# Patient Record
Sex: Female | Born: 1935 | Race: White | Hispanic: No | Marital: Married
Health system: Southern US, Community
[De-identification: ages and names within clinical notes are randomized; demographics above are authoritative.]

## PROBLEM LIST (undated history)

## (undated) DIAGNOSIS — K222 Esophageal obstruction: Secondary | ICD-10-CM

## (undated) DIAGNOSIS — E039 Hypothyroidism, unspecified: Secondary | ICD-10-CM

## (undated) DIAGNOSIS — J310 Chronic rhinitis: Secondary | ICD-10-CM

## (undated) DIAGNOSIS — J45909 Unspecified asthma, uncomplicated: Secondary | ICD-10-CM

## (undated) DIAGNOSIS — D693 Immune thrombocytopenic purpura: Secondary | ICD-10-CM

## (undated) DIAGNOSIS — N6009 Solitary cyst of unspecified breast: Secondary | ICD-10-CM

## (undated) DIAGNOSIS — Z9289 Personal history of other medical treatment: Secondary | ICD-10-CM

## (undated) DIAGNOSIS — K660 Peritoneal adhesions (postprocedural) (postinfection): Secondary | ICD-10-CM

## (undated) DIAGNOSIS — E785 Hyperlipidemia, unspecified: Secondary | ICD-10-CM

## (undated) DIAGNOSIS — K08109 Complete loss of teeth, unspecified cause, unspecified class: Secondary | ICD-10-CM

## (undated) DIAGNOSIS — R739 Hyperglycemia, unspecified: Secondary | ICD-10-CM

## (undated) DIAGNOSIS — E049 Nontoxic goiter, unspecified: Secondary | ICD-10-CM

## (undated) DIAGNOSIS — K279 Peptic ulcer, site unspecified, unspecified as acute or chronic, without hemorrhage or perforation: Secondary | ICD-10-CM

## (undated) DIAGNOSIS — K922 Gastrointestinal hemorrhage, unspecified: Secondary | ICD-10-CM

## (undated) DIAGNOSIS — Z8719 Personal history of other diseases of the digestive system: Secondary | ICD-10-CM

## (undated) DIAGNOSIS — M1990 Unspecified osteoarthritis, unspecified site: Secondary | ICD-10-CM

## (undated) DIAGNOSIS — I10 Essential (primary) hypertension: Secondary | ICD-10-CM

## (undated) DIAGNOSIS — K635 Polyp of colon: Secondary | ICD-10-CM

## (undated) DIAGNOSIS — Z972 Presence of dental prosthetic device (complete) (partial): Secondary | ICD-10-CM

## (undated) HISTORY — DX: Solitary cyst of unspecified breast: N60.09

## (undated) HISTORY — DX: Hyperlipidemia, unspecified: E78.5

## (undated) HISTORY — DX: Nontoxic goiter, unspecified: E04.9

## (undated) HISTORY — PX: APPENDECTOMY: SHX54

## (undated) HISTORY — PX: ABDOMINAL HYSTERECTOMY: SHX81

## (undated) HISTORY — DX: Esophageal obstruction: K22.2

## (undated) HISTORY — DX: Peptic ulcer, site unspecified, unspecified as acute or chronic, without hemorrhage or perforation: K27.9

## (undated) HISTORY — DX: Hypothyroidism, unspecified: E03.9

## (undated) HISTORY — PX: THYROID SURGERY: SHX805

## (undated) HISTORY — PX: BACK SURGERY: SHX140

## (undated) HISTORY — PX: OTHER SURGICAL HISTORY: SHX169

## (undated) HISTORY — DX: Essential (primary) hypertension: I10

## (undated) HISTORY — PX: BREAST SURGERY: SHX581

## (undated) HISTORY — DX: Immune thrombocytopenic purpura: D69.3

## (undated) HISTORY — PX: CHOLECYSTECTOMY: SHX55

## (undated) HISTORY — DX: Polyp of colon: K63.5

## (undated) HISTORY — PX: HEMORRHOID SURGERY: SHX153

## (undated) HISTORY — DX: Peritoneal adhesions (postprocedural) (postinfection): K66.0

## (undated) HISTORY — PX: JOINT REPLACEMENT: SHX530

## (undated) HISTORY — PX: SPLENECTOMY, TOTAL: SHX788

## (undated) HISTORY — DX: Chronic rhinitis: J31.0

## (undated) HISTORY — DX: Hyperglycemia, unspecified: R73.9

## (undated) HISTORY — PX: HERNIA REPAIR: SHX51

---

## 1997-10-01 ENCOUNTER — Other Ambulatory Visit: Admission: RE | Admit: 1997-10-01 | Discharge: 1997-10-01 | Payer: Self-pay | Admitting: Family Medicine

## 1997-11-30 ENCOUNTER — Ambulatory Visit (HOSPITAL_COMMUNITY): Admission: RE | Admit: 1997-11-30 | Discharge: 1997-11-30 | Payer: Self-pay | Admitting: Gastroenterology

## 2000-10-03 ENCOUNTER — Other Ambulatory Visit: Admission: RE | Admit: 2000-10-03 | Discharge: 2000-10-03 | Payer: Self-pay | Admitting: Family Medicine

## 2001-01-08 ENCOUNTER — Encounter: Payer: Self-pay | Admitting: Family Medicine

## 2001-01-08 ENCOUNTER — Ambulatory Visit (HOSPITAL_COMMUNITY): Admission: RE | Admit: 2001-01-08 | Discharge: 2001-01-08 | Payer: Self-pay | Admitting: Family Medicine

## 2001-01-31 ENCOUNTER — Encounter (INDEPENDENT_AMBULATORY_CARE_PROVIDER_SITE_OTHER): Payer: Self-pay | Admitting: *Deleted

## 2001-01-31 ENCOUNTER — Encounter: Payer: Self-pay | Admitting: General Surgery

## 2001-01-31 ENCOUNTER — Ambulatory Visit (HOSPITAL_COMMUNITY): Admission: RE | Admit: 2001-01-31 | Discharge: 2001-01-31 | Payer: Self-pay | Admitting: General Surgery

## 2001-05-07 ENCOUNTER — Inpatient Hospital Stay (HOSPITAL_COMMUNITY): Admission: RE | Admit: 2001-05-07 | Discharge: 2001-05-08 | Payer: Self-pay | Admitting: Surgery

## 2001-05-07 ENCOUNTER — Encounter (INDEPENDENT_AMBULATORY_CARE_PROVIDER_SITE_OTHER): Payer: Self-pay | Admitting: *Deleted

## 2002-11-06 ENCOUNTER — Other Ambulatory Visit: Admission: RE | Admit: 2002-11-06 | Discharge: 2002-11-06 | Payer: Self-pay | Admitting: Family Medicine

## 2002-12-15 ENCOUNTER — Ambulatory Visit (HOSPITAL_COMMUNITY): Admission: RE | Admit: 2002-12-15 | Discharge: 2002-12-15 | Payer: Self-pay | Admitting: Gastroenterology

## 2002-12-15 ENCOUNTER — Encounter (INDEPENDENT_AMBULATORY_CARE_PROVIDER_SITE_OTHER): Payer: Self-pay

## 2003-07-06 ENCOUNTER — Encounter: Admission: RE | Admit: 2003-07-06 | Discharge: 2003-07-06 | Payer: Self-pay | Admitting: Orthopedic Surgery

## 2003-09-22 ENCOUNTER — Ambulatory Visit (HOSPITAL_COMMUNITY): Admission: RE | Admit: 2003-09-22 | Discharge: 2003-09-22 | Payer: Self-pay | Admitting: Specialist

## 2003-09-22 ENCOUNTER — Ambulatory Visit (HOSPITAL_BASED_OUTPATIENT_CLINIC_OR_DEPARTMENT_OTHER): Admission: RE | Admit: 2003-09-22 | Discharge: 2003-09-22 | Payer: Self-pay | Admitting: Specialist

## 2003-10-12 ENCOUNTER — Encounter: Admission: RE | Admit: 2003-10-12 | Discharge: 2003-12-20 | Payer: Self-pay | Admitting: Specialist

## 2003-12-21 ENCOUNTER — Ambulatory Visit (HOSPITAL_COMMUNITY): Admission: RE | Admit: 2003-12-21 | Discharge: 2003-12-21 | Payer: Self-pay | Admitting: Gastroenterology

## 2004-04-04 ENCOUNTER — Encounter: Admission: RE | Admit: 2004-04-04 | Discharge: 2004-04-27 | Payer: Self-pay | Admitting: Physician Assistant

## 2004-05-04 ENCOUNTER — Encounter: Admission: RE | Admit: 2004-05-04 | Discharge: 2004-05-04 | Payer: Self-pay | Admitting: Gastroenterology

## 2004-11-16 ENCOUNTER — Encounter: Admission: RE | Admit: 2004-11-16 | Discharge: 2004-11-16 | Payer: Self-pay | Admitting: Specialist

## 2005-03-12 HISTORY — PX: EYE SURGERY: SHX253

## 2005-07-18 ENCOUNTER — Encounter: Admission: RE | Admit: 2005-07-18 | Discharge: 2005-07-18 | Payer: Self-pay | Admitting: Specialist

## 2005-08-31 ENCOUNTER — Ambulatory Visit (HOSPITAL_COMMUNITY): Admission: RE | Admit: 2005-08-31 | Discharge: 2005-09-01 | Payer: Self-pay | Admitting: Specialist

## 2005-10-08 ENCOUNTER — Other Ambulatory Visit: Admission: RE | Admit: 2005-10-08 | Discharge: 2005-10-08 | Payer: Self-pay | Admitting: Family Medicine

## 2005-12-13 ENCOUNTER — Inpatient Hospital Stay (HOSPITAL_COMMUNITY): Admission: RE | Admit: 2005-12-13 | Discharge: 2005-12-20 | Payer: Self-pay | Admitting: Specialist

## 2005-12-13 ENCOUNTER — Ambulatory Visit: Payer: Self-pay | Admitting: Family Medicine

## 2006-01-15 ENCOUNTER — Encounter: Admission: RE | Admit: 2006-01-15 | Discharge: 2006-02-06 | Payer: Self-pay | Admitting: Specialist

## 2006-12-11 LAB — HM COLONOSCOPY

## 2006-12-27 ENCOUNTER — Inpatient Hospital Stay (HOSPITAL_COMMUNITY): Admission: EM | Admit: 2006-12-27 | Discharge: 2006-12-30 | Payer: Self-pay | Admitting: Emergency Medicine

## 2007-03-27 ENCOUNTER — Encounter: Admission: RE | Admit: 2007-03-27 | Discharge: 2007-03-27 | Payer: Self-pay | Admitting: Gastroenterology

## 2007-10-28 ENCOUNTER — Encounter: Admission: RE | Admit: 2007-10-28 | Discharge: 2007-10-28 | Payer: Self-pay | Admitting: Gastroenterology

## 2007-12-09 ENCOUNTER — Ambulatory Visit (HOSPITAL_COMMUNITY): Admission: RE | Admit: 2007-12-09 | Discharge: 2007-12-09 | Payer: Self-pay | Admitting: Gastroenterology

## 2008-03-12 HISTORY — PX: TOTAL KNEE ARTHROPLASTY: SHX125

## 2008-07-08 ENCOUNTER — Encounter: Admission: RE | Admit: 2008-07-08 | Discharge: 2008-07-08 | Payer: Self-pay | Admitting: Surgery

## 2009-02-16 ENCOUNTER — Ambulatory Visit (HOSPITAL_COMMUNITY): Admission: RE | Admit: 2009-02-16 | Discharge: 2009-02-16 | Payer: Self-pay | Admitting: Gastroenterology

## 2009-07-28 ENCOUNTER — Encounter: Admission: RE | Admit: 2009-07-28 | Discharge: 2009-07-28 | Payer: Self-pay | Admitting: Family Medicine

## 2009-08-01 LAB — HM MAMMOGRAPHY

## 2009-10-13 LAB — HM PAP SMEAR

## 2009-12-28 LAB — HM DEXA SCAN

## 2010-04-02 ENCOUNTER — Encounter: Payer: Self-pay | Admitting: Gastroenterology

## 2010-05-01 LAB — FECAL OCCULT BLOOD, GUAIAC: Fecal Occult Blood: NEGATIVE

## 2010-06-09 ENCOUNTER — Encounter: Payer: Self-pay | Admitting: Family Medicine

## 2010-06-09 DIAGNOSIS — D693 Immune thrombocytopenic purpura: Secondary | ICD-10-CM | POA: Insufficient documentation

## 2010-06-09 DIAGNOSIS — E049 Nontoxic goiter, unspecified: Secondary | ICD-10-CM | POA: Insufficient documentation

## 2010-06-09 DIAGNOSIS — K279 Peptic ulcer, site unspecified, unspecified as acute or chronic, without hemorrhage or perforation: Secondary | ICD-10-CM

## 2010-06-09 DIAGNOSIS — K635 Polyp of colon: Secondary | ICD-10-CM | POA: Insufficient documentation

## 2010-06-09 DIAGNOSIS — K222 Esophageal obstruction: Secondary | ICD-10-CM

## 2010-06-09 DIAGNOSIS — N6009 Solitary cyst of unspecified breast: Secondary | ICD-10-CM

## 2010-06-09 DIAGNOSIS — K660 Peritoneal adhesions (postprocedural) (postinfection): Secondary | ICD-10-CM | POA: Insufficient documentation

## 2010-06-09 DIAGNOSIS — E039 Hypothyroidism, unspecified: Secondary | ICD-10-CM

## 2010-06-09 DIAGNOSIS — E785 Hyperlipidemia, unspecified: Secondary | ICD-10-CM | POA: Insufficient documentation

## 2010-06-09 DIAGNOSIS — R739 Hyperglycemia, unspecified: Secondary | ICD-10-CM

## 2010-06-09 DIAGNOSIS — J310 Chronic rhinitis: Secondary | ICD-10-CM | POA: Insufficient documentation

## 2010-06-09 DIAGNOSIS — I10 Essential (primary) hypertension: Secondary | ICD-10-CM | POA: Insufficient documentation

## 2010-07-25 NOTE — Op Note (Signed)
[name]             ACCOUNT NO.:  [id number]   MEDICAL RECORD NO.:  [id number]          PATIENT TYPE:  AMB   LOCATION:  ENDO                         FACILITY:  [facility]   PHYSICIAN:  James L. Malon Kindle., M.D.DATE OF BIRTH:  23-Aug-1925   DATE OF PROCEDURE:  12/09/2007  DATE OF DISCHARGE:                               OPERATIVE REPORT   PROCEDURE:  Esophagogastroduodenoscopy with Savary dilatation under  fluoroscopic guidance.   MEDICATIONS:  Cetacaine spray, fentanyl 75 mcg, Versed 7.5 mg IV.   INDICATIONS:  A very nice 77 year old patient who has had a previous  antireflux operation many years ago in the early 65s or the late 60s.  This was on an open Nissen.  She has had no problem until recently  started having some dysphagia.  Barium swallow showed a stricture right  above the clips for the Nissen with a poor peristalsis in the esophagus.  The patient is having trouble swallowing pills and solids and after a  long discussion in the office we decided to go ahead and evaluate this  area endoscopically and perform a dilatation to see if this would help.  The risks and benefits of procedure had been discussed with her in  detail, as well as the alternatives.   DESCRIPTION OF PROCEDURE:  The patient was placed in the endoscopy unit  using fluoroscopic guidance.  The Pentax upper scope was inserted and  advanced.  The distal esophagus was reached.  It was slightly narrowed  with possibly a stricture.  The scope passed through easily.  A complete  endoscopy was performed and was normal.  The duodenal bulb and stomach  were seen well and forward and retroflex were normal.  Using  fluoroscopic guidance the Savary wire was threaded through the scope and  the scope withdrawn over the wire.  The wire was seen to traverse the  diaphragm and lie along greater curve.  I then extended the patient's  neck in the usual manner and passed a 12.8, 14-15 mm dilators.  A small  amount of  heme with a 15-mm dilator.  The dilator and guidewire were  withdrawn as a unit.   ASSESSMENT:  Esophageal stricture dilated to 15 mm.  No immediate  problems noted.   PLAN:  We will have to keep the patient on clear liquids today.  Soft  foods tomorrow and then advance her diet.  We will see her back in the  office in 6 weeks.  We will keep all of her medications the same.           ______________________________  Llana Aliment Malon Kindle., M.D.     Waldron Session  D:  12/09/2007  T:  12/09/2007  Job:  [id number]   cc:   Ernestina Penna, M.D.  Fax: [phone]

## 2010-07-25 NOTE — H&P (Signed)
[name], [name]             ACCOUNT NO.:  [id number]   MEDICAL RECORD NO.:  [id number]          PATIENT TYPE:  INP   LOCATION:  1829                         FACILITY:  [facility]   PHYSICIAN:  Petra Kuba, M.D.    DATE OF BIRTH:  1925/11/29   DATE OF ADMISSION:  12/27/2006  DATE OF DISCHARGE:                              HISTORY & PHYSICAL   HISTORY OF PRESENT ILLNESS:  This is a 77 year old female who is  experiencing bright red blood per rectum over the last approximately 7  days.  She is status post colonoscopy yesterday that showed evidence of  bleeding from diverticular opening, also severe diverticulosis  throughout her colon.  The patient has some pain with movement in her  right flank.  She reports that she has been mildly constipated recently.  She has seen bright red blood only with brownish stools.  She has had 2  bowel movements with blood this morning.  The patient has had a recent  kidney infection and has been on Keflex 250 mg q.h.s. for several  days.  The patient has also had a cough.  She reports she has a history  of asthmatic bronchitis and has been experiencing this cough for 2 to 3  weeks.  The patient is currently in stable condition in the ER.  She  states that she is dizzy when standing.  Her hemoglobin on December 23, 2006, was 12.4 yesterday and in the [facility] office it was 8.6. She is  currently in an 81 mg aspirin but no other NSAIDs or anticoagulations.   PAST MEDICAL HISTORY:  1. Significant for osteoarthritis in her right knee.  She is status      post knee replacement.  2. Hypertension.  3. Borderline diabetes mellitus.  4. Hypothyroidism.  5. She is status post thyroidectomy in 2003.  6. Hyperlipidemia.  7. Gastroesophageal reflux disease.  8. Asthma/bronchitis.  9. Chronic hematuria.  10.Frequent UTIs.  11.She has a history of kidney stones.  12.Gastric ulcer in 2005.  13.History of colon polyps in 2004.  14.She has had multiple  cervical spinal surgeries and appendectomy,      hysterectomy, cholecystectomy.   PRIMARY CARE PHYSICIAN:  Rudi Heap, M.D. in [facility].   Margarette AsalAlinda Money, MD with [facility].   GASTROENTEROLOGIST:  Dr. Carman Ching with [facility].   CURRENT MEDICATIONS:  1. Synthroid.  2. Lipitor.  3. 81 mg aspirin per day.  4. Diovan HCT.  5. Calcium.  6. Multivitamin.  7. Tylenol.  8. Ambien.   ALLERGIES:  SHE HAS NO KNOWN DRUG ALLERGIES.   FAMILY HISTORY:  Positive for significant colon cancer in first-degree  relatives.   SOCIAL HISTORY:  She reports no drugs, alcohol or tobacco.   PHYSICAL EXAMINATION:  GENERAL:  She is alert and oriented in no  apparent distress.  She is very pleasant to speak with.  Her complexion  is slightly pale.  VITAL SIGNS:  Currently temperature 98.0, pulse 116, respirations 20,  blood pressure 126/63.  HEART:  Regular rate and rhythm.  She is slightly tachycardic.  There  are no murmurs, rubs or gallops appreciated on auscultation.  LUNGS:  Clear anteriorly.  ABDOMEN:  Soft, nontender, nondistended with hyperactive bowel sounds.   LABORATORY DATA:  Show a hemoglobin of 8.3, hematocrit 24.9, white count  has risen to 18.8, platelets 257,000.  PTT is on the low side 20, PT of  13.5.  Comprehensive metabolic panel shows her sodium at 139, potassium  3.5, chloride 106, bicarb 24, BUN 13, creatinine 0.98, glucose 137.  AST  30, ALT 15, alkaline phosphatase 67, total bilirubin 0.8.   ASSESSMENT:  Dr. Vida Rigger has seen and examined the patient, collected  a history and has chosen to admit her with a lower gastrointestinal  bleed.  His impression is that this a 77 year old female with multiple  medical problems who is currently experiencing a diverticular bleed with  an increasing white count.  We will admit to a telemetry bed.  Give a 2  unit transfusion of packed red blood cells gently rehydrate, start   antibiotics, closely monitor and provide deep vein thrombosis  prophylaxis.      Stephani Police, PA    ______________________________  Petra Kuba, M.D.    MLY/MEDQ  D:  12/27/2006  T:  12/28/2006  Job:  [id number]   cc:   Ernestina Penna, M.D.  Petra Kuba, M.D.  Carman Ching, MD

## 2010-07-28 NOTE — Op Note (Signed)
NAME:  [name], [name]               ACCOUNT NO.:  [id number]   MEDICAL RECORD NO.:  [id number]                   PATIENT TYPE:  AMB   LOCATION:  NESC                                 FACILITY:  [facility]   PHYSICIAN:  Kerrin Champagne, M.D.                DATE OF BIRTH:  1925-09-28   DATE OF PROCEDURE:  09/22/2003  DATE OF DISCHARGE:                                 OPERATIVE REPORT   PREOPERATIVE DIAGNOSES:  Right knee torn medial meniscus.  Degenerative  changes involving the medial joint line of the right knee.   POSTOPERATIVE DIAGNOSES:  Torn medial meniscus right knee involving  primarily the posterior rim and posterior attachment of the medial meniscus.  Degenerative changes of the medial femoral condyle grade 2 and 3 changes.  Loose body right knee involving osteochondral fragment.   PROCEDURE:  Right knee diagnostic and operative arthroscopy with partial  medial meniscectomy, chondroplastic shaving of the medial femoral condyle,  excision of osteochondral fragment.   HISTORY OF PRESENT ILLNESS:  The patient is a 77 year old female who was  seen in my office acutely after having sustained injury to her right knee  when having fallen on a stairway. She had severe pain and discomfort into  her knee, difficulty with standing and walking for a period of time treated  conservatively with knee immobilizer, Ace wrap for knee sprain.  As the  patient proceeded, she continued with discomfort and severe pain.  MRI scan  demonstrating bone contusion medially but also a torn medial meniscus  involving the posterior horn body.  The possibility of early osteonecrosis  of the medial femoral condyle. The patient is brought to the operating room  to undergo partial medial meniscectomy and examination of her knee as she  persists with pain and discomfort, difficulty with ambulation now nearly two  months following her initial injury.   FINDINGS:  The patient was found to have a tear that  was primarily  horizontal cleave involving the posterior rim of the medial meniscus in the  region of the posterior attachment as well, complex tear involving the area  of the posterior attachment primarily on the inferior surface. This was  debrided and trimmed and then smoothed using shavers as well as baskets.   DESCRIPTION OF PROCEDURE:  After adequate general anesthesia, the patient in  a supine position, knee holder for the right knee, standard prep with  Duraprep solution, tourniquet about the right upper thigh that was not used  during the case.  Following prep, draped in the usual manner.  The knee then  anesthetized by subcu infiltration using Marcaine 0.5% with 1:200,000  epinephrine over the anterior inferior medial, anterior inferior lateral  portal regions.  The knee inflated with 60 mL of irrigant solution and stab  incisions made in the knee and entry into the knee over the inferolateral  portal using the arthroscopic sleeve with a blunt trocar.  The arthroscopic  sleeve was used for inflow  as well.   The patient's suprapatellar pouch evaluated as was the medial recess of the  medial joint line. The medial joint line immediately identified and torn  meniscus within the posterior rim and posterior attachment of the medial  meniscus. The medial joint line was quite wide with valgus stressing of the  knee.  The meniscus anteriorly and medially appeared to be in good  condition. The ACL appeared to be normal and the PCL did demonstrate some  slight fraying along its posterior lateral aspect. The lateral joint line  demonstrated some minimal fraying of the fibers of the meniscus within the  joint line centrally but no definitive care.   Stab incision then made over the medial joint line following introduction of  a spinal needle at the meniscocapsular junction.  A stab incision with an 11  blade scalpel and then blunt trocar used to enter the joint and a joint  probe then  placed. The probe placed over the meniscus in the posterior  medial corner if the medial meniscus and this demonstrated a definite tear  here.  A shaver was introduced for shaving the tear, baskets were then used  to further debride the tear in the posterior horn and the posterior rim  medially and laterally near its attachments. After trimming this area to  remove the larger portions of the tear then the meniscus was carefully  smoothed and remained attached posteriorly with the posterior pieces that  were more superiorly inserted in the capsule and the posterior aspect of the  knee. The probe was then replaced in the knee joint and attempts made to try  and sublux the cartilage into the joint, these were unsuccessful  demonstrating the cartilage to be well attached. On entry into the lateral  joint line, the osteochondral fragment was noted to be present. This quickly  floated away and then further evaluation of the knee demonstrated the  osteochondral fragment within the medial joint line. This was able to be  shaved using the arthroscopic shaver to a much smaller size and then it  eventually again cleared away. With attempts at further examination of the  knee, we were unable to identify the fragment further.  It was felt that  this was so small that it did not represent any further impediment function  or cause for mechanical dysfunction. Several areas of the joint did  demonstrate that she had had previous osteochondral fragments attached to  the synovium which were left in the place and did not represent a mechanical  problem with her knee.  Irrigation was then performed of the knee, attempts  at removing any further osteochondral material after further irrigation and  then the inflow and out flow were removed following removal of as much  irrigant solution from the knee as possible.   Each of the incisions both lower incision were infiltrated with Marcaine 0.5%  with 1:200,000  epinephrine and closed with interrupted 3-0 Vicryl  sutures.  Steri-Strips applied and tincture of Benzoin. Adaptic 4 x 4's, ABD  pad affixed to the skin with Kerlix and then an Ace wrap. The patient was  then reactivated and extubated and returned to the recovery room in  satisfactory condition.   POSTOPERATIVE CARE:  The patient will use a walker, weightbearing as  tolerated and take Vicodin #40, 1-2q. 4-6h. p.r.n. pain, 1 refill. Baby  aspirin 1 tablet p.o. b.i.d., be seen back in the office in 2 weeks for  reevaluation.  Kerrin Champagne, M.D.    Myra Rude  D:  09/22/2003  T:  09/22/2003  Job:  [id number]

## 2010-07-28 NOTE — Discharge Summary (Signed)
[name], [name]             ACCOUNT NO.:  [id number]   MEDICAL RECORD NO.:  [id number]          PATIENT TYPE:  INP   LOCATION:  5028                         FACILITY:  [facility]   PHYSICIAN:  Kerrin Champagne, M.D.   DATE OF BIRTH:  [birth year]   DATE OF ADMISSION:  12/13/2005  DATE OF DISCHARGE:  12/20/2005                                 DISCHARGE SUMMARY   ADMISSION DIAGNOSES:  1. Right knee osteoarthritis, severe.  2. Hypertension.  3. Borderline diabetes, controlled by diet.  4. Hypothyroidism, status post thyroidectomy.  5. Hyperlipidemia.  6. Gastroesophageal reflux disease  7. History of asthma.  8. History of chronic hematuria.  9. History of frequent urinary tract infections.  10.History of kidney stones.  11.History of gastric ulcer.  12.Status post anterior cervical discectomy and fusion C5-6 and recent      cervical posterior foraminotomy.  13.Status post right knee arthroscopy.   DISCHARGE DIAGNOSES:  .  Right knee osteoarthritis, severe.  1. Hypertension.  2. Borderline diabetes, controlled by diet.  3. Hypothyroidism, status post thyroidectomy.  4. Hyperlipidemia.  5. Gastroesophageal reflux disease  6. History of asthma.  7. History of chronic hematuria.  8. History of frequent urinary tract infections.  9. History of kidney stones.  10.History of gastric ulcer.  11.Status post anterior cervical discectomy and fusion C5-6 and recent      cervical posterior foraminotomy.  12.Status post right knee arthroscopy.  13.Postoperative anemia.  14.Urinary tract infection and urinary retention, treated and resolving at      discharge.  15.Hypokalemia, resolved at discharge.  16.Supratherapeutic INR at 3.2 on discharge with planned continued      Coumadin for deep venous thrombosis prophylaxis.   PROCEDURE:  On December 13, 2005, the patient underwent right cemented DePew  total knee arthroplasty, performed by [name], assisted by Maud Deed,  PA-C, under  general anesthesia, supplemented with right femoral block.   CONSULTATIONS:  Family [facility]ropractor Service at [facility].   BRIEF HISTORY:  Patient is a 77 year old white female with osteoarthritic  changes of the right knee.  She is status post arthroscopic debridement of  the right knee over a year ago.  She has had persistent and progressive pain  and dysfunction.  She has been treated with intraarticular steroids, as well  as multiple halogen injection series without significant relief of her  symptoms.  It was felt she would require surgical intervention and was  admitted to undergo the above stated procedure.   BRIEF HOSPITAL COURSE:  Patient tolerated the procedure under general  anesthesia without complications.  She did have significant difficulty with  pain control and PCA pump was initially used, which was not adequate with  Percocet being added, which was still not adequate and then patient was  placed on OxyContin 10 mg q.12 hours with Percocet for break through pain.  This had to be increased to OxyContin 20 mg q.12 hours for the Percocet with  break through pain and she did finally receive relief of her discomfort.  Due to the difficulty with pain control, she was slow with initiation  of  physical therapy.  For the first several days, she had very little mobility.  Eventually, she was able to ambulate, weight bearing as tolerated on the  operative extremity and prior to discharge had ambulated 200 feet with  rolling walker and moderately independent the day before discharge.  CPM was  ranging to 70 and 80 degrees prior to discharge.  She did have full  extension of the knee as well.  Also, she was able to do a straight leg  raise and had good quad control and, therefore, her knee immobilizer was  discontinued at the day of discharge.  Patient was followed by the family  practice teaching service for her medical issues.  Fortunately, she remained  quite stable  throughout the hospital stay.  She did have treatment with  sliding scale insulin and CBGs were found to be within normal limits at the  later part of her hospital stay without medication.  She was placed on  Coumadin for DVT and PE prophylaxis.  The doses of Coumadin were adjusted by  the pharmacist according to daily ProTimes.  At the time of discharge, she  was supratherapeutic and dosages were being adjusted.  Patient had  hypokalemia, which resolved after treatment with supplementation orally.  Due to the history of gastric ulcers, she was placed on Protonix daily and  tolerated this well.  Her Hemovac drain was discontinued on the first  postoperative day and dressing changes done daily, thereafter, showed wound  to be healing quite well without erythema or drainage.  Due to her slow  start with physical therapy, initially it was thought she may require a  nursing home placement; however, she proved to be quite motivated and was  able to be discharged to her home with arrangements made for home health  physical therapy.  The patient had an episode of urinary retention on the  day before discharge.  A urinalysis was obtained and she was noted to have a  urinary tract infection.  She was given one dose of Flomax.  She was much  more stable with bladder symptoms at the day of discharge.  Pertinent  laboratory values, otherwise, patient's hemoglobin and hematocrit ranged  from 14.3 on admission to 9.8 on December 17, 2005.  She was placed on  Trinsicon.  Hemoglobin A1c, on December 14, 2005, was noted to be 6.2.  Urinalysis as stated above.  Patient had hyponatremia, on December 14, 2005,  at 3.3 which resolved with oral supplementation.  Portable x-ray of the  right knee, on December 13, 2005, showed normal appearance status post total  knee replacement.  Chest x-ray, on August 29, 2005, showed no acute findings.  No EKG on the chart at the time of dictation.  PLAN:  The patient is being  discharged to her home.  Arrangements have been  made for her to receive home health physical therapy, as well as a bath aide  and home health RN through [facility].  For physical therapy,  she will do range of motion and strengthening exercises to the right lower  extremity.  She will weight bear as tolerated for ambulation.  She is not  required to use the knee immobilizer for ambulation, although she has this  available to her if she feels she needs to utilize the knee immobilizer.  Wound care will involve merely allowing the present Steri-Strips to resolve  spontaneously.  She will be allowed to shower and get her wound wet.  She  will continue on Coumadin and ProTimes will be drawn with adjustments made  according to these values.  She will stay on her usual regimen of  medications at discharge and is given prescriptions for OxyContin 20 mg one  p.o. q.12 hours, Percocet 5/325 one to two every 4 to 6 hours as needed for  pain, Robaxin 500 mg one every 8 hours as needed for spasm, Trinsicon 1 p.o.  b.i.d., MiraLax daily, Keflex 500 mg one p.o. t.i.d. x10 days, Protonix 40  mg p.o. daily, Coumadin per pharmacy.  Patient is advised to continue on a  diabetic type diet.  She will use a walker for  ambulation.  She is advised to see [name] 2 weeks from the date of her  surgery.  All questions encouraged and answered at the time of discharge.   CONDITION ON DISCHARGE:  Stable.      Wende Neighbors, P.A.      Kerrin Champagne, M.D.  Electronically Signed    SMV/MEDQ  D:  12/20/2005  T:  12/20/2005  Job:  [id number]

## 2010-07-28 NOTE — Op Note (Signed)
   NAME:  [name], MODISETTE               ACCOUNT NO.:  [id number]   MEDICAL RECORD NO.:  [id number]                   PATIENT TYPE:  AMB   LOCATION:  ENDO                                 FACILITY[facility]  [facility]   PHYSICIAN:  James L. Malon Kindle., M.D.          DATE OF BIRTH:  [birth year]   DATE OF PROCEDURE:  12/15/2002  DATE OF DISCHARGE:                                 OPERATIVE REPORT   PROCEDURE:  Colonoscopy and coagulation of polyps.   ENDOSCOPIST:  Llana Aliment. Edwards, M.D.   MEDICATIONS:  Fentanyl 100 mcg, Versed 10 mg IV.   INDICATIONS:  Strong family history of colon cancer.   DESCRIPTION OF PROCEDURE:  The procedure had been explained to the patient  and consent obtained.  With the patient in the left lateral decubitus  position the scope was inserted.  The prep was quite good.  We were able to  advance to the cecum identified by identification of the ileocecal valve and  appendiceal orifice which was all normal.   The scope was withdrawn.  There were scattered diverticula throughout the  colon.  The ascending colon, transverse, and descending colon were free of  polyps.  There were diverticula in the descending colon.  Multiple large  amounts of diverticula with balls of stool.  In the midsigmoid colon a 3-mm,  sessile polyp was encountered.  This was cauterized with the hot biopsy  forceps.  No other polyps were seen in the sigmoid or rectum.   The scope was withdrawn.  The patient tolerated the procedure well.   ASSESSMENT:  1. Sigmoid colon polyp, cauterized.  2. Strong family history of colon cancer.  3. Pandiverticulosis.   PLAN:  1. We will check path and will recommend repeating in 3-5 years depending on     path results.  2. Routine postpolypectomy instructions.                                               James L. Malon Kindle., M.D.   Waldron Session  D:  12/15/2002  T:  12/15/2002  Job:  [id number]   cc:   Ernestina Penna, M.D.  [address] [place]  [place] [address]  Fax: [phone]

## 2010-07-28 NOTE — Discharge Summary (Signed)
[name], [name]             ACCOUNT NO.:  [id number]   MEDICAL RECORD NO.:  [id number]          PATIENT TYPE:  INP   LOCATION:  6707                         FACILITY:  [facility]   PHYSICIAN:  Petra Kuba, M.D.    DATE OF BIRTH:  May 26, 1925   DATE OF ADMISSION:  12/27/2006  DATE OF DISCHARGE:  12/30/2006                               DISCHARGE SUMMARY   ADMITTING DIAGNOSIS:  Diverticular bleed.   DISCHARGE DIAGNOSES:  1. Diverticular bleed.  The rest are consistent with her past medical      history.  2. Hypertension.  3. Hypothyroidism.  4. Borderline diabetes mellitus.  5. Hyperlipidemia.  6. Gastroesophageal reflux disease.  7. Asthmatic bronchitis.  8. Chronic hematuria.  9. History of gastric ulcer.  10.History of colon polyps.   There were no consults or procedures.   RADIOLOGICAL EXAMS:  1. Chest x-ray done on December 27, 2006 for a 3-week cough with a      history of bronchitis.  The x-ray showed no acute disease.  2. The patient had a CT of her abdomen done on December 30, 2006 for      right lower quadrant pain of 3 days' duration.  The results of CT      showed:  1.  Left renal cyst.  Six-month CT followup was      recommended;  2.  Multiple bilateral lower lobe pulmonary nodules.  Chest CT without  contrast was recommended for further evaluation no acute intraabdominal  findings.  In the pelvis, impression is:  1.  Enlarged mesenteric lymph  nodes with stranding, likely mesenteric adenitis or could be related to  gastroenteritis;  2.  Colonic diverticulosis. without diverticulitis.  There was no explanation for her right lower quadrant pain.   BRIEF HISTORY AND PHYSICAL:  [name] is a 77 year old female who  experienced bright red blood per rectum over the last 7-10 days prior to  her admission.  She had a colonoscopy done on December 26, 2006 by Dr.  Carman Ching which showed bleeding from a diverticular opening as well  as severe diverticulosis  throughout her colon.  She reports that she had  been mildly constipated recently, and she sees bright red blood only  with brownish stools.  She reported that she had been experiencing  increasing cough over the last 2-3 weeks and that she was dizzy on  standing.  Her hemoglobin on December 23, 2006 in the [facility] office was 12.4.  On October 16, it had dropped to 8.6,  and Dr. Randa Evens recommended that she be admitted.   PHYSICAL EXAMINATION:  GENERAL:  On admission she was alert, oriented,  and in no apparent distress, very pleasant to speak with.  Her  complexion was slightly pale.  VITAL SIGNS:  Temperature was 98 degrees, pulse 116, respirations 20,  blood pressure was 126/63.  HEART:  Slightly tachycardic, but there were no murmurs, rubs, or  gallops appreciated.  LUNGS:  Clear anteriorly.  ABDOMEN:  Soft, nontender, nondistended, with hyperactive bowel sounds.   LABORATORIES:  Showed a hemoglobin of 8.3, hematocrit 24.9,  white count  18.8, and platelets 257,000.  Her PTT was slightly low at 20, PT was  13.5.  Comprehensive metabolic panel showed a sodium 139, potassium 3.5,  BUN 13, creatinine 0.98.  Her LFTs were within normal range.   HOSPITAL COURSE:  The patient was admitted, gently rehydrated.  She had  a transfusion of 4 units of packed red blood cells, and was placed on  Cipro and Flagyl IV, as well as Protonix, and she was given SCDs for DVT  prophylaxis.  The next morning, the patient reported feeling much  better.  She did have a black stool, but she was no longer experiencing  any dizziness. Over the course of the next couple of days, the patient  continued to improve.  Her diet was advanced.  She was able to ambulate  well.  Her urine culture came back negative.  On the morning of December 30, 2006, the patient told me that she felt well and wanted to go home.  She was still experiencing some minimal right lower quadrant pain that  she had had since  her admission, so was decided that she should have a  CT scan to evaluate this before being discharged.  Her CBC on December 28, 2006 was hemoglobin 11.4, hematocrit 33.1.  White count had  decreased to 11.4, and platelets were 241.  Her BMET was within normal  limits.  The patient was discharged to home later that day.  She was  given a prescription for 12 days of oral Flagyl as well as oral Cipro.  Her abdominal CT was negative for a source of right lower quadrant pain.  She was discharged to home in good condition.   DISCHARGE MEDICATIONS:  1. Cipro 400 mg 1 tablet b.i.d. for 12 days.  2. Flagyl 500 mg 1 tablet t.i.d. for 12 days.  3. Synthroid 150 mcg daily.  4. Synthroid 150 mcg 1/2 tablet on Sunday only.  5. Lipitor 40 mg 1 tablet daily.  6. Diovan/HCT 160/25 mg 1 tablet daily.  7. Aspirin 81 mg 1 tablet daily.  8. Os-Cal 500 mg plus vitamin D 1 tablet daily.  9. Multivitamin 1 tablet daily.  10.Tylenol 500 mg 2 tablets as needed.  11.Ambien CR 10 mg 1 tablet daily.   FOLLOW-UP INSTRUCTIONS:  The patient was given an appointment to see Dr.  Carman Ching at [facility] on January 10, 2007 at 2:30.  She was also  instructed to please call our office with any bright red blood per  rectum or increase in her right lower abdominal pain.      Stephani Police, PA    ______________________________  Petra Kuba, M.D.    MLY/MEDQ  D:  01/01/2007  T:  01/02/2007  Job:  [id number]   cc:   Fayrene Fearing L. Malon Kindle., M.D.  Ernestina Penna, M.D.  Maretta Bees. [name], M.D.

## 2010-07-28 NOTE — Op Note (Signed)
[name], [name]             ACCOUNT NO.:  [id number]   MEDICAL RECORD NO.:  [id number]          PATIENT TYPE:  AMB   LOCATION:  ENDO                         FACILITY:  [facility]   PHYSICIAN:  James L. Malon Kindle., M.D.DATE OF BIRTH:  04/03/1925   DATE OF PROCEDURE:  12/21/2003  DATE OF DISCHARGE:                                 OPERATIVE REPORT   PROCEDURE:  Esophagogastroduodenoscopy and biopsy.   MEDICATIONS:  Xylocaine spray, fentanyl 75 mcg, Versed 7 mg IV.   INDICATIONS FOR PROCEDURE:  Right upper quadrant pain.   DESCRIPTION OF PROCEDURE:  The procedure had been explained to the patient  and consent obtained. With the patient in the left lateral decubitus  position, Olympus scope inserted and advanced. The stomach, duodenum and  pylorus identified and passed. The duodenum was __________ down to the  second portion. The scope was withdrawn back in the stomach in the immediate  prepyloric area with a 1 cm gastric ulcer that appeared to be healing.  The  patient has been on the Aciphex sample that I gave her for approximately a  week. There were several erosions around the antrum __________ biopsies  taken for rapid urase test for helicobacter.  There were no other  ulcerations seen. The fundus and cardia were seen well on the retroflexed  view and were normal. The distal and proximal esophagus were endoscopically  normal. The scope was withdrawn and the patient tolerated the procedure well  and was monitored throughout.   ASSESSMENT:  Gastric ulcer appeared to be healing in the antrum, 531.30.   PLAN:  Will continue the patient on Aciphex and check the results of the  test for helicobacter and see back in the office in six weeks.      JLE/MEDQ  D:  12/21/2003  T:  12/21/2003  Job:  [id number]   cc:   Ernestina Penna, M.D.  [address] [place]  [place] [address]  Fax: [phone]

## 2010-07-28 NOTE — Consult Note (Signed)
[name], [name]             ACCOUNT NO.:  [id number]   MEDICAL RECORD NO.:  [id number]          PATIENT TYPE:  INP   LOCATION:  5028                         FACILITY:  [facility]   PHYSICIAN:  Barth Kirks, M.D.  DATE OF BIRTH:  10-14-1925   DATE OF CONSULTATION:  12/14/2005  DATE OF DISCHARGE:                                   CONSULTATION   REQUESTING PHYSICIAN:  Dr. Otelia Woodward of orthopedics for medical management.   CHIEF COMPLAINT:  Total knee replacement medical management consult.   HISTORY OF PRESENT ILLNESS:  Patient is a 77 year old pleasant white female  we were asked to see for medical consult by Dr. Otelia Woodward.  The patient has a  past medical history significant for hypertension, hyperthyroidism status  post thyroidectomy, hyperlipidemia, and borderline diabetes mellitus.  Patient is postop day #1 from right total knee replacement.  Patient is  followed by [name] at [facility].  Patient currently only  complains of pain status post surgery.   REVIEW OF SYSTEMS:  Review of systems is negative for all pertinents,  including negative for weight loss or weight gain, negative for chest pain,  negative shortness of breath, negative for heart or lung problems, negative  for abdominal pain.  Positive history of hematuria for whom she sees Dr.  [name] and has a history of kidney stones.  She has no history of blood  clots, and no history of blood in her stool.   PAST MEDICAL HISTORY:  Past medical history is significant for:  1. Osteoarthritis of the right knee, now status post total knee      replacement.  2. History of gastric ulcer.  3. Hypertension.  4. Borderline diabetes.  5. Hyperthyroidism status post thyroidectomy.  6. Hyperlipidemia.  7. Kidney stones.  8. Hematuria.   PAST SURGICAL HISTORY:  1. Debridement of right knee, now postop day #1 from a total right knee      replacement.  2. Left C6 facetectomy.  3. Left carpal tunnel release.  4.  Total thyroidectomy.   ALLERGIES:  No known drug allergies.   MEDICATIONS AT HOME:  1. Diovan/HCTZ half a tablet of 320/25 p.o. q. daily.  2. Lipitor half a tablet of 80 mg p.o. q. daily.  3. Synthroid 150 mcg q. daily.  4. Os-Cal with vitamin D 500 p.o. q. daily.  5. Multi vitamin 1 q. daily.  6. Glucosamine chondroitin 1 q. daily.  7. Aspirin 81 mg p.o. q. daily.  8. Albuterol inhaler 2 puffs p.r.n.  9. Tums p.o. p.r.n.  10.Vicodin 1 tab p.o. p.r.n.  11.Tylenol extra strength 2 tabs p.o. p.r.n.   CURRENT MEDS WHILE IN THE HOSPITAL:  1. Lipitor 40 mg q. daily.  2. Calcium carbonate 1 tab q. daily.  3. Ancef 1 g IV q.8.  4. Colace 100 mg p.o. b.i.d.  5. Heparin 5000 units sub cu q.12 hours.  6. Hydrochlorothiazide 12.5 mg q. daily.  7. Avapro 300 mg p.o. q. daily.  8. Levothyroxine 150 mcg p.o. q. daily.  9. Morphine sulfate 25 mcg IV q.4.  10.Senna 1 tab p.o. b.i.d.  11.Warfarin per pharmacy.  12.Acetaminophen 325 p.o. q.4 hours p.r.n.  13.Albuterol 2 puffs p.r.n.  14.Benadryl as needed.  15.Robaxin 500 mg p.o. q.6 hours p.r.n.  16.Reglan 10 mg IV q.6 p.r.n.  17.Narcan as needed.  18.Zofran 1 mg IV q.6.  19.Percocet 2 tablets p.o. q.4 hours p.r.n.  20.Phenergan 12.5 mg IV q.6.   SOCIAL HISTORY:  Patient lives with her husband in [place].  She has 3  kids, 2 in [place], 1 in [place].  She ambulates well at home.  She drives.  She has no history of tobacco, alcohol, or drug usage.   FAMILY HISTORY:  She has a strong family history of colon cancer.  Mom had a  history of cancer.  Her dad had a history of lung cancer.  There is no  family history of coronary artery disease or diabetes.   PHYSICAL EXAMINATION:  Vital signs:  T-current was 99.6 degrees Fahrenheit.  T-max was 100.7.  Yesterday, her T-max was 101.4.  Pulse was 89.  Respirations of 16.  Blood pressure is 103/52.  Sats are 96% on 2 L.  CBGs  are 124-135.  In general, she is in no acute  distress, pleasant, talkative.  HEENT:  Dry mucous membranes.  Extraocular muscles are intact.  CARDIOVASCULAR:  Regular rate and rhythm, with 3/6 systolic flow murmur,  which was present at her baseline previously.  No rub or gallop.  LUNGS:  Left lower lung has rhonchus sounds consistent with atelectasis,  anteriorly clear.  No wheezes.  ABDOMEN:  Soft, nontender, nondistended.  Positive bowel sounds.  EXTREMITIES:  Right knee in brace status post surgery.  Left leg in TED  hose.  No edema.  NEURO:  Alert and oriented x3.  Cranial nerves 2-12 are afocal.   Knee films:  Normal-appearing status post knee replacement.   LABORATORY DATA:  Today she had a white count of 12.6, hemoglobin 10.8,  hematocrit of 31.8, platelets of 181, potassium of 3.3, sodium of 138,  chloride of 101, bicarb at 30, BUN of 11, creatinine of 1.2, glucose 150,  calcium 8.1.  PT was 14.6.  INR was 1.1.   ASSESSMENT/PLAN:  This is a 77 year old white female status post right total  knee replacement post op day #1 with hypertension, hyperlipidemia, and  borderline diabetes, and hypothyroidism.  1. Status post knee replacement; this is for orthopedics.  Patient is      having physical therapy as scheduled.  Coumadin per pharmacy.  Pain      control is morphine PCA p.r.n.  Stool softeners p.r.n.  Incentive      spirometry will include at bedside q.2 p.r.n.  2. Hypertension:  She is well controlled on her home meds, which is      Diovan/HCTZ.  Here she has been substituted with Avapro/HCTZ.  This was      substituted by pharmacy.  Currently she is well controlled on that at      this time.  We will continue to monitor.  3. Borderline diabetes:  Will check CBGs q.a.c.  Without an evening      coverage, will do sliding scale with a sensitive scale of insulin per      sheet.  4. Hypothyroidism:  Continue her Synthroid 150 mcg q. daily. 5. FEN:  She is hypokalemic at this time and we will replace it.  Her IV       fluids currently are going of D5 normal saline at 75 mL an hour.  She      was then written to keep vein open  (KVO) as tolerated.  We will check      a BMET and a CBC in the a.m.  6. For prophylaxis, she is currently on heparin and Coumadin.  This is per      orthopedics.  We will start Protonix 40 mg IV daily with her known      history of gastric ulcer disease and GI prophylaxis.   We will be happy to follow this patient along with you for medical  management.  Thank you for this consult.      Barth Kirks, M.D.     MB/MEDQ  D:  12/14/2005  T:  12/14/2005  Job:  [id number]   cc:   Christell Constant, MD

## 2010-07-28 NOTE — Op Note (Signed)
[name], SAZAMA             ACCOUNT NO.:  [id number]   MEDICAL RECORD NO.:  [id number]          PATIENT TYPE:  INP   LOCATION:  5028                         FACILITY:  [facility]   PHYSICIAN:  Kerrin Champagne, M.D.   DATE OF BIRTH:  25-Feb-1926   DATE OF PROCEDURE:  12/13/2005  DATE OF DISCHARGE:                                 OPERATIVE REPORT   PREOPERATIVE DIAGNOSIS:  Severe osteoarthritis, right knee.   POSTOPERATIVE DIAGNOSIS:  Severe osteoarthritis, right knee.   PROCEDURE:  Right cemented DePuy PFC total knee arthroplasty using #3  femoral and #3 tibial components and a 12.5-mm posterior-stabilized tibial  tray, a 32-mm patella.   SURGEON:  Kerrin Champagne, M.D.   ASSISTANT:  Wende Neighbors, P.A.-C.   ANESTHESIA:  General via orotracheal intubation, Dr. Jacklynn Bue, supplemented  with a right femoral block by Dr. Jacklynn Bue.   SPECIMENS:  None.   ESTIMATED BLOOD LOSS:  75 cc.   COMPLICATIONS:  None.   TOTAL TOURNIQUET TIME:  At 300 mmHg, 2 hours 20 minutes.   DISPOSITION:  The patient returned to the PACU in good condition.   HISTORY OF PRESENT ILLNESS:  A 77 year old female, who has been treated for  osteoarthritis changes of the right knee.  She underwent primary  arthroscopic surgery with debridement of her knee with persistent pain and  discomfort.  She was treated with steroid injections and repeated  Synvisc/Hyalgan injections without significant relief of pain.  She is  brought to the operating room to undergo right total knee arthroplasty for  severe tricompartmental osteoarthritis.   INTRAOPERATIVE FINDINGS:  The patient had primarily medial joint line  arthrosis changes, lateral joint line with only mild changes, patellofemoral  joint with mild changes.   DESCRIPTION OF PROCEDURE:  After adequate general anesthesia, the patient  had a right thigh post, a foot holder for flexion of the right knee, a  tourniquet about the right upper thigh.  She was  prepped with DuraPrep  solution, including the right toes up to the level of the thigh tourniquet,  draped in the usual manner.  Iodine Vi-Drape was used.  Standard  preoperative antibiotics with Ancef.  The Green lab guide was used for the  procedure, and this was placed in the room for correct orientation.  The  right lower extremity was then elevated, exsanguinated with an Esmarch  bandage, and the tourniquet inflated to 300 mmHg.  A standard anterior  incision over the right knee, approximately 12 to 15 cm in length, through  the skin and subcutaneous layers, directed in the midline over the patella,  superior to the patella by about 5 to 6 cm, and inferior on the medial  aspect to the anterior tibial tubercle, about 2 cm beyond the anterior  tibial tubercle, through the skin and subcutaneous layers down to the  external retinaculum of the knee.  This was then incised and developed  medial and lateral for later approximation for a watertight closure later.  Entry into the knee then performed at the insertion of the quad tendon over  its medial insertion, preserving a cuff  of tissue along the medial aspect of  the parapatella, and then long the medial aspect of the patellar tendon  distally down to bone, and through the fat pad over the anterior aspect of  the proximal tibia.  This was continued down distally to the periosteum, and  then extended superiorly into the medial third of the quadriceps tendon  until the patella could be easily everted.  The synovial layer was also  incised on the medial aspect of the patella.   The posterior aspect of the patellar tendon fat pad was then debrided over  about 50% of its width, along with the medial aspect of the anterior aspect  of the tibia debrided the fat pad here.  Both menisci debrided over their  anteriors, anterior 50%, medial and lateral.  Anterior cruciate ligament was  incised, divided and removed.  The knee brought into some slight  forward  positioning of the tibia, allowing for debridement of the posterior cruciate  ligament. Osteophytes, both medial and lateral aspect of the femoral  condyles, medial and lateral tibial plateau, were debrided, and  subperiosteal dissection carried over the anterior aspect of the tibia both  medial and lateral, exposing the joint surface and joint line medial and  lateral, as well as anterior.  Carefully elevating the pes anserinus tendons  medially, along with the medial collateral ligament in order to tension the  soft tissues medially, as this patient had a varus knee.  With this  completed, the knee was then brought into flexion with the patella everted.  The fat pad overlying the distal portion of the femur just above the  condyles in the supracondylar region was then carefully incised in the  midline, and then elevated off the anterior aspect of the femur.  This for  exposure of the distal portion of the femur just above the supracondylar  region.   Schanz screws were then placed inside the incision over the anteromedial  aspect of the distal femur above the joint surface such that a guide pin for  placement of the jigs and guides for the jigs would be able to be used  appropriately without impinging on these Schanz screws.  Two Schanz screws  were placed proximally for the array proximal, and then 2 Schanz screws were  placed over the distal anteromedial aspect of the tibia about 3 to 4 inches  distal to the distal-most portion of the anterior incision of the knee.  These 2 Schanz screws were placed without difficulty, engaging the posterior  cortex over all 4 Schanz screws.   The Y array placed over the distal femur and adjusted appropriately.  The T  array applied to the tibia and adjusted for the computer.  Once the  adjustments were made so that the knee could be brought into full flexion  and full extension with excellent access to the computer and to its receiving  consoles, the arrays were then carefully fixed into place using  pliers to tighten each of the fasteners and hold the arrays in excellent  position and alignment sturdy.  With this completed, then, the registration  was performed for the axis of the femoral head, performing a circumduction-  type motion of the femur and the thigh.  Once these were obtained and the  registration was performed of the patient's medial and lateral malleoli, as  well as the anterior surface of the tibia, the posterior surface of the  tibial and the medial and lateral surfaces of the  tibia.  The distal end of  the femur just above the intercondylar notch was also mapped.   The femur, both medial and lateral epicondyles, were mapped, and then the  anterior surface of the femur mapped.  The distal-most portions of the  femoral condyle and posterior aspects of the femoral condyle, both medial  and lateral, were mapped, successively obtaining registration of points over  the distal-most portions of the femur, medial and lateral femoral condyles,  as well as the most posterior aspects of these condyles.  Then, measurement  of the convexity of the femur was carried out using the flat guide provided.  This returned to 0 as its registration.   The white size line was measured for both the femur, and over the tibia as  well.  The insertion point of the anterior cruciate ligament at its midpoint  of its insertion was measured, and the right-angle white size line was  measured across the tibia for its degree of rotation.   Mapping was then performed, not only of the femur, but of the proximal  tibia, mapping the anterior surface of the proximal tibia.  The medial  tibial plateau, and then lateral tibial plateaus, the deepest recesses, and  within 6 mm of this deep recess.   Each of these areas were then verified and were within 1 mm, in fact, within  0.5 mm of the described mapping and the computer's model for the  distal  femur and proximal tibia.  This being the case, then, the knee was brought  into full flexion and the guide for the proximal tibial cut with the applied  guide inserted with its array.  It was then carefully guided into place in  the proximal tibial jig, approximating about a 10-mm cut for the proximal  tibia; it was aligned in its correct axis in flexion and extension, as well  as medial-to-lateral, varus-valgus, and then pinned into place within 0.5  degrees and within 1.0 mm of the expected implant placement.  This being the  case, then, the cut was made in the proximal tibia, performing the cut,  carefully protecting the soft tissue structures.  Measurement of the  flatness of the surface of the proximal tibia indicated that the cut was  only 8.7 mm; so the second cut was then made 2 mm below the previous cut and  this returned a 10.2-mm depth cut and cut much closer to the predicted cut  of 10 mm of the proximal tibia.  The surface was well flattened, and measurements off of the flat surface of the proximal tibia indicated that  the tibial cut was within 0.5 degree of expected varus/valgus and within a  millimeter or less of the expected cut surface.  The edges were debrided of  any bony debris.  Excess meniscus posteriorly was then resected both  medially and laterally.  Attention then turned to the distal femoral surface  and the knee was flexed.  The distal femoral cutting jig was then carefully  aligned with the array attached into the correct position and alignment, and  then pinned into place using crosspins appropriately.  The distal cut was  then performed using an oscillating saw, making the transverse cut for the  distal femur.  This was done without difficulty, and measurement off the  distal femur indicated that a small amount needed to be removed off the  lateral condyle, and this was done through the same cutting jig without any  significant change  in the position  or alignment, removing further bone and  then allowing for good approximation of the expected cut surface.   Once this was completed, then the patient had the distal guide for the femur  with its array attached carefully centered onto the end of the femur and  aligned for the corrected plane here.  Then, using the 2 entry points  provided then, this was pinned into place and then removed.  Using the 2 pin  holes provided for the corrected flexion and extension of the end of the  femur, as well as external rotation, the cutting jig for the chamfer cuts,  as well as coronal, anterior and posterior cuts was applied.  This was  impacted into place and held in place with 2 tongs medial and lateral.  With  this, then, the anterior coronal cut was performed, and the cuts  posteriorly, using retractors to protect the soft tissue structures  posteriorly and the correct posterior condylar cuts were made, resecting an  appropriate amount of bone, both anterior and posterior.  Care was taken to  insure that the anterior cut would not damage the anterior surface of the  femur or cause broaching of the cortex, and this was done using the claw and  indicated that the cut would be superficial to the femoral cortex, and it  was such with the cutting with the oscillating saw.  Chamfer cuts were then  performed posteriorly and anteriorly.  With this, then, the distal cutting  jig for the box for the femur was then applied and carefully aligned into  position and alignment and pinned into place.  Using a thin oscillating saw,  then, the cuts were made for the box between the condyles of the femur  distally, and this was done without difficulty, rasping the edges  appropriately to smooth these.  This cutting jig was then removed.  The  patient then had the tibia subluxed anteriorly, and the #3 proximal tibial  plate was then carefully aligned and rotated into correct position and  alignment to allow for  adequate coverage of the proximal tibia.  Once it was providing excellent coverage and there was no overhang medially, this was  then pinned into place.  The upright for the proximal tibia reamer was then  carefully applied to the plate and impacted into place, obtaining seating  here.  The reamer in reverse was then used to ream the proximal tibia for  the keel for the tibial prosthesis.  Once this was completed, then, the  upright for the reamer, along with the reamer were removed.  A central peg  was then placed, along with its filler cap for allowing for a trial  prosthesis.  The femur #3 trial was then placed over the end of the femur  and impacted into place without difficulty and provided an excellent fit.  The 10-mm trial insert was placed, and then the leg placed into full  extension.  Note that the patient did undergo an assessment of soft tissue  tensioning.  This was done prior to the femoral cuts using the tensioner  between the medial and lateral femoral condyle with the knee in extension.  The extension gap appeared to be symmetric at about 14 to 14.5 mm medial and  lateral, and then with flexion, the extension-flexion gap appeared to be  symmetric at about 8 mm, both over the medial and lateral femoral condyle.  With this, it was felt that the patient required  no further cuts of the  femur or tibia, and one would proceed with the cuts as decided.   Returning then to the application of the trial components, a 10-mm trial  appeared to keep the leg in very slight varus on full extension, and the  12.5-mm trial tibial tray appeared to correct this with soft tissue  tensioning. The knee could be corrected to neutral varus/valgus.  Then, with  a very slight amount of pressure over the lateral aspect of the knee,  indicating soft tissue likely was the cause of much of the continued slight  degree of varus, 1.5 to 2 degrees.  This being the case, then, components  were removed  after first performing a cut of the patella.  The patellar  width was measured at 24 mm/-8 mm, and 16 mm was to remain of the patella.  Using the patellar clamp provided with a 16-mm adjustment, this clamp was  then applied to the patella, capturing the patellar surface.  Soft tissue on  either side, both superior and inferior to the posterior aspect of the  patella, was carefully resected, and the oscillating saw was then used to  make the coronal cut of the patella, removing about 8 mm of the  retropatellar surface.  A 32-mm patellar component was chosen; this appeared  to be near the patient's anatomic size of the patella.  The guide for the  drill holes was then placed, 2 holes medial and 1 hole lateral at the  expected cut surface.  Drilling was performed.  These were irrigated.  Final  reduction showed that with a trial patella in place, the knee could be  brought into full extension and full flexion, without any evidence of  patellar instability or tendency for the patella to dislocate.  The tibia showed no evidence of liftoff and no tendency to anterior drawer  instability.  With this, then, all of the patient's trial components were  removed, including the patella, the femur, the proximal tibial plateau with  its keel, as well as the tibial tray.  Irrigation was carried out of the  knee joint using a pulsed irrigant solution.  Cement was mixed.   Once cement mixing was completed, the components were on the field: a #3  femoral component, a #3 tibial component, trial trays to be used, and a 32-  mm patella.  The cement was in its correct state.  The knee was carefully  dried and the tibia subluxed anteriorly with Mikhail retractors  appropriately, allowing for exposure of the surface here.  Rasping had been  performed over the lateral aspect of the proximal tibia to try to provide  for some improvement of the slight varus that was seen to be present.  After  rasping and irrigation,  then, this was dried.  Careful inspection  demonstrated no bony debris present.  The posterior osteophytes off the  posterior aspect of the femoral condyles were resected using 3/4-inch  osteotomes and large Cregos  to allow for exposure, and these were removed.  Cement was then applied to the proximal tibia, as well as into the keel  area.  The tibial component was inserted with the engagement of the wings  and the correct degree of internal and external rotation, impacted into  place, held in place and excess cement was removed.  Further impaction was  performed and excess cement removed and further impaction performed.  Note  that some cement was placed over the bone.  I  inspected the surface of the  implant for allowing for further filling of the component-bone interfill.  Once this was completed and all excess cement had been removed, the tibia  was then replaced underneath the femur.  The femur surface was then  carefully dried with the sponges provided.  Cement was then applied over the  anterior aspect of the distal femur, as well as the distal portion of the  femur into lug holes that had been drilled using the trial prosthesis in  place, and had been irrigated as well, also over the posterior chamfer  region.  Additional cement applied over the posterior runners of the  permanent prosthesis.  The femoral component was then applied to the end of  the femur and impacted into place.  Excess cement was then removed, both  medial and lateral, as well as over the posterior aspects of the box cut  area.  The femur was further impacted in place into excellent position and  alignment both medial and lateral with good bone-to-prosthesis seating.  With this completed, then, a trial tibial tray of 12.5 mm was inserted  without the center post.  The knee was brought into full extension.  With  the knee in full extension, then the patellar component was applied.  Note  that the peg holes were  each carefully cleansed and dried.  Cement was applied, and then the 32-mm patellar component applied, and using the ringed  clamp, clamped into place.  Excess cement was removed circumferentially  about the patellar component.  The knee was then carefully stressed into a  slight valgus to try to remove any residual varus present.  In full  extension, this seemed to correct the knee to less than 1 degree of varus.  With this completed, then, irrigation was performed and the cement was  completely hardened.  The clamp was removed from the patella.  Also note  that while the cement was hardening, the patient had release of her  tourniquet at 2 hours 20 minutes.  The cement was completely hardened.  Excess cement was carefully trimmed from around the patellar prosthesis  using a knife.  The joint was debrided of any remaining residual cement  within the soft tissue structures.  Small amounts of cement were removed  from the posterior aspect of the knee, over the posterior aspect of the  femoral condyles and the box cut area.  Careful inspection was made for the  geniculate vessels, both medial and lateral, and also the perforating  vessels posteriorly.  These area were cauterized, and there was no  significant active bleeding present following the release of the tourniquet.  Care was taken to insure that the tourniquet had completely released, and  there was no further inflation of the tourniquet present, neither venous  tourniquet or arterial.   There was practically no bleeding present in the depths of the incision.  With this, then, the knee was subluxed using the Mikhails provided,  carefully protecting the smooth, polished surfaces of the implant.  The 12.5  permanent prosthesis was then inserted with the tibial tray into the  proximal portion of the tibia.  The knee was then replaced and relocated and  brought into full extension.  In full extension, 1 to 1.5 degrees of varus  noted on  the monitor.  The knee could be brought into full extension and  flexion of over 130 degrees.  The patella remained seated without any  attempts at holding pressure or  holding the knee cap reduced.  The knee  could be brought into full extension without difficulty.  The knee, indeed,  could be brought into slight hyperextension.  There was no active bleeding  present.  A medium Hemovac drain was placed in the depths of the incision,  exiting proximal lateral.  The synovial layer was then reapproximated with a  running stitch of 0 Vicryl suture.  The patient had her quad tendon  reapproximated with interrupted, #1 Vicryl sutures, and the retinaculum of  the knee immediately access with #1 Vicryl sutures down to the level of the  anterior tibial tubercle, along with the retinaculum here.  The external  retinaculum of the knee was approximated with interrupted 0 and 2-0 Vicryl  sutures.  Deep subcu layers were approximated with interrupted 0 Vicryl  sutures, the more superficial layers with interrupted 2-0 Vicryl sutures, and the skin closed with a running subcuticular stitch of 4-0 Vicryl.  Tincture of benzoin and Steri-Strips were applied, and 4 x 4's and ABD pads  fixed to the skin with a Kerlix.  Then, a long-leg Ace wrap applied from the  right foot to the upper thigh.  A knee immobilizer was applied.  The patient  was then reactivated, extubated and returned to the recovery room in  satisfactory condition.  All instrument and sponge counts were correct.      Kerrin Champagne, M.D.  Electronically Signed     JEN/MEDQ  D:  12/13/2005  T:  12/13/2005  Job:  [id number]

## 2010-07-28 NOTE — Op Note (Signed)
NAMEBRESHAE, [name]             ACCOUNT NO.:  [id number]   MEDICAL RECORD NO.:  [id number]          PATIENT TYPE:  AMB   LOCATION:  SDS                          FACILITY:  [facility]   PHYSICIAN:  Kerrin Champagne, M.D.   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   POSTOPERATIVE DIAGNOSES:  Left-sided C5-6 foraminal entrapment involving  primarily the C6 nerve root.   PROCEDURE:  Redo left C6 foraminotomy with near complete total facetectomy  posteriorly C5-6 left side.   SURGEON:  Kerrin Champagne, M.D.   ASSISTANT:  Wende Neighbors, P.A.   ANESTHESIA:  GOT, Kaylyn Layer. Michelle Piper, M.D.   ESTIMATED BLOOD LOSS:  75 mL.   DRAINS:  None.   HISTORY OF PRESENT ILLNESS:  This patient is a 77 year old female whose  undergone previous posterior cervical foraminotomy on the left side at the  C5-6 level. This surgery done nearly a decade ago. 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 The fusion  has since gone on to heal with some collapse of the disk space. She had  problems with persistent left upper extremity pain, underwent a pronator  syndrome release as well as a left carpal tunnel release. She did well for a  period of nearly 5-7 years and now returns with increasing left upper  extremity pain in the C6 distribution. Left-sided C5 selective nerve root  block unsuccessful in relieving her pain. A C6 nerve root block did relieve  her pain. She underwent MRI studies which demonstrated some narrowing of the  left C6 neuroforamen. A CT scan limited  through the left C5-6 level  demonstrated foraminal entrapment that was persistent secondary to posterior  spondylosis changes, impingement upon the C6 neuroforamen by residual  posterior elements laterally. There is some mild uncovertebral changes but  not enough to suggest that anterior procedure was necessary.   FINDINGS:  The patient was found to have compression of the C6 nerve root  within the outer portions of the neuroforamen secondary to the superior  articular process of C6 pressing down upon the C6 nerve root. This likely  secondary to collapse of the disk space over her anterior diskectomy and  fusion site at C5-6.   DESCRIPTION OF PROCEDURE:  After adequate general anesthesia, the patient in  a prone position. The [place] horseshoe was used to support the head, the  eyes were checked to ensure no pressure on the orbits. The chin and the  forehead receiving the weight of the head, the neck in flexion. Tape was  then used to tape back the shoulders and pads over the anterior aspect of  the shoulders to level out any skin creases over the  posterior neck. All  pressure points were well padded, the arms at the side protecting the ulnar  nerve, the median nerve with skids bilaterally. TED stockings both lower  extremities prevent DVT. Given IV standard preoperative antibiotics,  standard prep with DuraPrep solution from the patient's posterior occiput to  T1-2. Draped in the usual manner, iodine Vi-Drape was used. Marking out the  midline previous incision scar, Marcaine was used to infiltrate the skin  locally. Incision then made at the expected C5-6 level, carried the screw  through the skin and subcutaneous layers. The spinous process is identified  and the incision carried slightly inferior in order to allow for more  exposure and to allow for insertion and to provide better retraction  exposure. The incision carried along the left side of the spinous process of  C4, C5, C6.  Clamps placed on the upper level C4 to C5, noted on  postoperative lateral radiograph to be on C4 and C5. The C5 level was marked  with a single stitch. The cervical muscles were then detached off the  posterior aspect of the lamina of C5 and C6 and exposure continued out  lateral to the lateral aspect of the facet, the left side C5-6. The previous  foraminotomy region was carefully exposed and demonstrated no abnormalities  posteriorly. Care was taken not to enter the previous foraminotomy site.  McCullough retractor was inserted, this was a Animal nutritionist and  provided excellent exposure over the left side of C5-6. C-arm fluoro was  then draped and brought onto the field. The initial exposure was performed  using loupe magnification with the head lamp. The operating room microscope  then brought into the field and using the light provided with the scope,  excellent exposure was obtained. A 2 mm Kerrison was used to remove a small  portion of the inferior aspect of the lamina residual on the left side at C5  and then a high speed bur used to thin the facet posteriorly; the inferior  articular process of C5 and superior articular process of C6 and then 2 mm  Kerrison's were then used to resect the superior articular process of C5 and  the upper aspect of the C6 superior articular process and then the remaining  portion of the superior articular process of C6 was resected from superior  to inferior within the neuroforamen decompressing the neuroforamen quite  nicely. With this then, the remaining portions of facet were then resected  out laterally until the nerve root was completely free such that a 2 mm  Kerrison could be passed out lateral without any further remaining bone  impressing down upon the C6 nerve root. This was well out almost 1-1/2 to 2  cm from the take off of the nerve. Examining the C6 nerve root then the pedicle of C6 was identified below the foramen and felt to  be well  decompressed as there was no residual superior articular process here. There  was evidence of impression upon the posterior aspect of the C6 nerve root  from superior to inferior along the line with the residual portions of the  remaining posterior elements. A nerve was used to carefully explore the C6  nerve root. There was no remaining fibrovascular sheath that required  dissection or removal. The nerve root was felt to be completely decompressed  at this point. Gelfoam was used to obtain hemostasis, a small amount of bone  wax applied to the superior portion of the pedicle at  C6 and excess bone wax  removed. After a moment or two then the Gelfoam was removed and there was no  active bleeding present. Irrigation was performed. The soft tissues were  then allowed to fall back into place. The ligament of nuchae and the  superior spinous process ligament then approximated with interrupted #0  Ethibond sutures. The deep subcu layers approximated with interrupted #0  Vicryl sutures. The more superficial layers with interrupted 2-0 Vicryl  sutures and the skin closed with a running subcu stitch of 4-0 Vicryl. The  patient had Dermabond applied then to the incision as well as to a small  skin slip inferior and to the right side that occurred when we were moving  the Vi-Drape. 4 x 4 affixed to the skin with Hypafix tape. The patient was  then returned to a supine position, reactivated, extubated and returned to  the recovery room in satisfactory condition. All instrument and sponge  counts were correct.      Kerrin Champagne, M.D.  Electronically Signed     JEN/MEDQ  D:  08/31/2005  T:  08/31/2005  Job:  [id number]

## 2010-07-28 NOTE — Op Note (Signed)
[facility]  Patient:    [name], [name] Visit Number: [id number] MRN: [id number]          Service Type: SUR Location: [id number] Attending Physician:  Bonnetta Barry Dictated by:Velora Heckler, M.D. Proc. Date: 05/07/01 Admit Date:05/07/2001   CC:         Monica Becton, M.D., [facility] E. Earlene Plater, M.D.                           Operative Report  PREOPERATIVE DIAGNOSIS:  Multinodular goiter with compressive symptoms.  POSTOPERATIVE DIAGNOSIS:  Multinodular goiter with compressive symptoms.  PROCEDURE:  Total thyroidectomy.  SURGEON:  Velora Heckler, M.D.  ASSISTANT:  Sheppard Plumber. Earlene Plater, M.D.  ANESTHESIA:  General.  ESTIMATED BLOOD LOSS:  Minimal.  PREPARATION:  Betadine.  COMPLICATIONS:  None.  INDICATIONS:  The patient is a 77 year old white female, presents with a four year history of multinodulargoiter.  The patient had follow-up ultrasound showing interval enlargement of the left lobe dominant nodule.  This measured 3.6 cm in size.  Fine-needle aspiration was performed November 2002, with findings showing hyperplastic nodule.  No malignancy was identified.  However, the patient has become having compressive symptoms with tightness in the neck, pressuresensation, and occasional choking with both solid and liquids.  The patient now comes to surgery for thyroidectomy.  DESCRIPTION OF PROCEDURE: The procedure is done in OR #6 at the [facility].  The patient is brought to the operating room and placed in a supine position on the operating room table.  Following administration of general anesthesia, the patient is positioned and then prepped and draped in the usual strict aseptic fashion.  After ascertaining that an adequate level of anesthesia had been obtained, a Kocher incision is made with a #10 blade. Dissection is carried down through the subcutaneous tissues  and platysma. Hemostasis is obtained with the electrocautery.  Skin flaps are developed cephalad and caudad from the thyroid notch to the sternal notch.  External jugular veins are ligated in continuity with 2-0 silk ties and divided. A Mahorner self-retaining retractor is placed for exposure.  Strap muscles are incised in the midline.  Dissection is begun on the left side.  Strap muscles are reflected laterally.  Venous tributaries to the left lobe are divided between small and medium Ligaclips.  The gland is mobilizedfrom the strap muscles.   Dissection is carried cephalad, and the superior pole is dissected out.  Superior pole vessels are ligated in continuity with 2-0 silk ties and medium Ligaclips and then divided.  The gland is rolled anteriorly.  Inferior thyroid veins are divided between small and medium Ligaclips.  Branches of the inferior thyroid artery are divided between small Ligaclips.  The gland is rolled further anterior.  Recurrent laryngeal nerve is identified and preserved.  Parathyroid tissue is identified and preserved.  The gland is rolled up and onto the anterior trachea.  Ligament of Allyson Sabal is divided with the electrocautery.  The gland isfurther mobilized onto the anterior trachea. A pack is placed in the left neck.  Next, we directed our attention to the right lobe.  Right-sided strap muscles are reflected laterally.  Gland is mobilized.  Venous tributaries are divided between small and medium Ligaclips. There is a quite large nodule in the right superior pole which is located posteriorly.  This is mobilized with the Barista.  Superior pole vessels again  are ligated in continuity with 2-0 silk ties and medium Ligaclips and then divided.  The gland is rolled further anteriorly.  Again, inferior thyroid veins are divided between Ligaclips.  Branches of the inferior thyroid artery are divided between small Ligaclips.  Gland is rolled further anteriorly, and  the recurrent laryngeal nerve positively identified. Inferiorparathyroid gland is identified and preserved.  Ligament of Allyson Sabal is again divided with the electrocautery.  The gland is excised completely off the anterior trachea and submitted to pathology for review.  A suture marks the left superior pole.  Good hemostasis is obtained throughout the neck using small Ligaclips and the electrocautery.  Both sides of the neck are irrigated copiously with warm saline.  A small piece of Surgicel is placedover the recurrent laryngeal nerves bilaterally.  The strap muscles are reapproximated in the midline with interrupted 3-0 Vicryl sutures.  Platysma is reapproximated with interrupted 3-0 Vicryl sutures.  Skin edges are reapproximated with widely spaced stainless steel staples and interspaced half-inch Steri-Strips and Benzoin.  Sterile gauze dressings are applied.  The patient is awakened from anesthesia and brought to the recovery room in stable condition.  The patient tolerated the procedure well. Dictated by:   Velora Heckler, M.D. Attending Physician:  Bonnetta Barry DD:  05/03/01 TD:  05/07/01 Job: [id number]

## 2010-08-28 ENCOUNTER — Other Ambulatory Visit: Payer: Self-pay | Admitting: Family Medicine

## 2010-08-28 DIAGNOSIS — Z1231 Encounter for screening mammogram for malignant neoplasm of breast: Secondary | ICD-10-CM

## 2010-09-22 ENCOUNTER — Ambulatory Visit: Payer: Self-pay

## 2010-12-11 LAB — GLUCOSE, CAPILLARY: Glucose-Capillary: 127 — ABNORMAL HIGH

## 2010-12-20 LAB — CROSSMATCH: ABO/RH(D): O POS

## 2010-12-20 LAB — COMPREHENSIVE METABOLIC PANEL
ALT: 12
ALT: 15
AST: 22
Albumin: 2.5 — ABNORMAL LOW
Alkaline Phosphatase: 55
Alkaline Phosphatase: 67
CO2: 24
Chloride: 104
Chloride: 106
GFR calc Af Amer: >60
Glucose, Bld: 137 — ABNORMAL HIGH
Potassium: 3.5
Potassium: 3.7
Sodium: 138
Sodium: 139
Total Bilirubin: 0.5
Total Bilirubin: 0.5
Total Protein: 5.6 — ABNORMAL LOW
Total Protein: 6.9

## 2010-12-20 LAB — CBC
HCT: 28.5 — ABNORMAL LOW
Hemoglobin: 10.9 — ABNORMAL LOW
Hemoglobin: 8.3 — ABNORMAL LOW
MCHC: 34.3
MCHC: 34.4
MCV: 92.6
Platelets: 224
Platelets: 241
RBC: 2.56 — ABNORMAL LOW
RBC: 3.44 — ABNORMAL LOW
RDW: 13.1
RDW: 15.3 — ABNORMAL HIGH
RDW: 15.5 — ABNORMAL HIGH
WBC: 12 — ABNORMAL HIGH
WBC: 18.8 — ABNORMAL HIGH

## 2010-12-20 LAB — BASIC METABOLIC PANEL
CO2: 28
CO2: 28
Calcium: 8 — ABNORMAL LOW
Calcium: 8.4
Chloride: 98
Creatinine, Ser: 0.85
Creatinine, Ser: 0.9
GFR calc Af Amer: >60
GFR calc non Af Amer: >60
Glucose, Bld: 112 — ABNORMAL HIGH
Glucose, Bld: 115 — ABNORMAL HIGH
Sodium: 134 — ABNORMAL LOW
Sodium: 139

## 2010-12-20 LAB — URINALYSIS, MICROSCOPIC ONLY
Glucose, UA: NEGATIVE
Nitrite: NEGATIVE
pH: 5

## 2010-12-20 LAB — DIFFERENTIAL
Basophils Absolute: 0.1
Lymphocytes Relative: 21
Monocytes Absolute: 0.8 — ABNORMAL HIGH
Neutro Abs: 14 — ABNORMAL HIGH
Neutrophils Relative %: 74

## 2010-12-20 LAB — APTT: aPTT: 20 — ABNORMAL LOW

## 2010-12-20 LAB — URINE CULTURE

## 2010-12-20 LAB — PROTIME-INR: INR: 1

## 2011-01-25 ENCOUNTER — Ambulatory Visit
Admission: RE | Admit: 2011-01-25 | Discharge: 2011-01-25 | Disposition: A | Discharge status: Home / Self Care | Payer: Medicare Other | Source: Ambulatory Visit | Attending: Family Medicine | Admitting: Family Medicine

## 2011-01-25 DIAGNOSIS — Z1231 Encounter for screening mammogram for malignant neoplasm of breast: Secondary | ICD-10-CM

## 2011-03-08 ENCOUNTER — Ambulatory Visit (INDEPENDENT_AMBULATORY_CARE_PROVIDER_SITE_OTHER): Payer: Self-pay | Admitting: Surgery

## 2011-04-11 ENCOUNTER — Encounter (INDEPENDENT_AMBULATORY_CARE_PROVIDER_SITE_OTHER): Payer: Self-pay | Admitting: Surgery

## 2011-04-12 ENCOUNTER — Ambulatory Visit (INDEPENDENT_AMBULATORY_CARE_PROVIDER_SITE_OTHER): Payer: Self-pay | Admitting: Surgery

## 2011-05-07 ENCOUNTER — Other Ambulatory Visit: Payer: Self-pay | Admitting: Family Medicine

## 2011-05-07 DIAGNOSIS — E785 Hyperlipidemia, unspecified: Secondary | ICD-10-CM | POA: Diagnosis not present

## 2011-05-07 DIAGNOSIS — R109 Unspecified abdominal pain: Secondary | ICD-10-CM | POA: Diagnosis not present

## 2011-05-07 DIAGNOSIS — E039 Hypothyroidism, unspecified: Secondary | ICD-10-CM | POA: Diagnosis not present

## 2011-05-07 DIAGNOSIS — E559 Vitamin D deficiency, unspecified: Secondary | ICD-10-CM | POA: Diagnosis not present

## 2011-05-07 DIAGNOSIS — I10 Essential (primary) hypertension: Secondary | ICD-10-CM | POA: Diagnosis not present

## 2011-05-09 ENCOUNTER — Ambulatory Visit (HOSPITAL_COMMUNITY)
Admission: RE | Admit: 2011-05-09 | Discharge: 2011-05-09 | Disposition: A | Discharge status: Home / Self Care | Payer: Medicare Other | Source: Ambulatory Visit | Attending: Family Medicine | Admitting: Family Medicine

## 2011-05-09 ENCOUNTER — Other Ambulatory Visit: Payer: Self-pay | Admitting: Family Medicine

## 2011-05-09 DIAGNOSIS — R109 Unspecified abdominal pain: Secondary | ICD-10-CM | POA: Insufficient documentation

## 2011-05-09 DIAGNOSIS — R102 Pelvic and perineal pain: Secondary | ICD-10-CM

## 2011-05-09 DIAGNOSIS — N200 Calculus of kidney: Secondary | ICD-10-CM | POA: Diagnosis not present

## 2011-05-09 DIAGNOSIS — N949 Unspecified condition associated with female genital organs and menstrual cycle: Secondary | ICD-10-CM | POA: Diagnosis not present

## 2011-05-09 DIAGNOSIS — N281 Cyst of kidney, acquired: Secondary | ICD-10-CM | POA: Diagnosis not present

## 2011-05-17 ENCOUNTER — Encounter (INDEPENDENT_AMBULATORY_CARE_PROVIDER_SITE_OTHER): Payer: Self-pay | Admitting: Surgery

## 2011-05-17 DIAGNOSIS — H43819 Vitreous degeneration, unspecified eye: Secondary | ICD-10-CM | POA: Diagnosis not present

## 2011-05-17 DIAGNOSIS — E119 Type 2 diabetes mellitus without complications: Secondary | ICD-10-CM | POA: Diagnosis not present

## 2011-05-17 DIAGNOSIS — H04129 Dry eye syndrome of unspecified lacrimal gland: Secondary | ICD-10-CM | POA: Diagnosis not present

## 2011-05-17 DIAGNOSIS — H3530 Unspecified macular degeneration: Secondary | ICD-10-CM | POA: Diagnosis not present

## 2011-05-21 ENCOUNTER — Encounter (INDEPENDENT_AMBULATORY_CARE_PROVIDER_SITE_OTHER): Payer: Self-pay | Admitting: Surgery

## 2011-05-21 ENCOUNTER — Ambulatory Visit (INDEPENDENT_AMBULATORY_CARE_PROVIDER_SITE_OTHER): Payer: Medicare Other | Admitting: Surgery

## 2011-05-21 DIAGNOSIS — N6019 Diffuse cystic mastopathy of unspecified breast: Secondary | ICD-10-CM | POA: Diagnosis not present

## 2011-05-21 DIAGNOSIS — R3919 Other difficulties with micturition: Secondary | ICD-10-CM | POA: Diagnosis not present

## 2011-05-21 DIAGNOSIS — N6009 Solitary cyst of unspecified breast: Secondary | ICD-10-CM

## 2011-05-21 NOTE — Progress Notes (Signed)
Visit Diagnoses: 1. Breast cyst   2. Fibrocystic breast changes     HISTORY: Patient is a 78 year old white female followed for fibrocystic changes in the breast. She had undergone left partial mastectomy in the distant past. She has annual mammography. Most recent mammogram is from November 2012. This was compared to prior studies. Breast tissue was heterogeneously dense. There were no masses or malignant type calcifications. Patient returns today for annual breast exam.  PERTINENT REVIEW OF SYSTEMS: The patient denies any new breast masses or lesions. She denies nipple discharge. She does have some persistent mild discomfort in the left breast.  EXAM: HEENT: normocephalic; pupils equal and reactive; sclerae clear; dentition good; mucous membranes moist NECK:  symmetric on extension; no palpable anterior or posterior cervical lymphadenopathy; no supraclavicular masses; no tenderness; incisions are well healed with good cosmetic results CHEST: clear to auscultation bilaterally without rales, rhonchi, or wheezes CARDIAC: regular rate and rhythm without significant murmur; peripheral pulses are full BREAST: Right breast shows normal nipple areolar complex. Breast parenchyma is diffusely nodular without discrete or dominant mass. Mild tenderness to palpation. The right axilla without adenopathy. Left breast shows well-healed surgical wound laterally. Nipple areolar complex is normal. Breast parenchyma was diffusely nodular without discrete or dominant mass. Left axilla is without adenopathy. EXT:  non-tender without edema; no deformity NEURO: no gross focal deficits; no sign of tremor   IMPRESSION: Fibrocystic change of the breast, clinically benign breast examination  PLAN: The patient and I reviewed her mammogram results. We discussed her physical examination. I have encouraged her to do self-examination. She will have annual mammography. She will return in one year for physical  examination.  Velora Heckler, MD, FACS General & Endocrine Surgery [facility]

## 2011-05-21 NOTE — Patient Instructions (Signed)
Fibrocystic Breast Changes Fibrocystic breast changes is a non-cancerous(benign) condition that about half of all women have at some time in their life. It is also called benign breast disease and mammary dysplasia. It may also be called fibrocystic breast disease, but it is not really a disease. It is a common condition that occurs when women go through the hormonal changes during their menstrual cycle, between the ages of 20 to 50. Menopausal women do not have this problem, unless they are on hormone therapy. It can affect one or both breasts. This is not a sign that you will later get cancer. CAUSES  Overgrowth of cells lining the milk ducts, or enlarged lobules in the breast, cause the breast duct to become blocked. The duct then fills up with fluid. This is like a small balloon filled with water. It is called a cyst. Over time, with repeated inflammation there is a tendency to form scar tissue. This scar tissue becomes the fibrous part of fibrocystic disease. The exact cause of this happening is not known, but it may be related to the female hormones, estrogen and progesterone. Heredity (genetics) may also be a factor in some cases. SYMPTOMS   Tenderness.   Swelling.   Rope-like feeling.   Lumpy breast, one or both sides.   Changes in the size of the breasts, before and after the menstrual period (larger before, smaller after).   Green or dark brown nipple discharge (not blood).  Symptoms are usually worse before periods (menstrual cycle) and get better toward the end of menstruation. Usually, it is temporary minor discomfort. But some women have severe pain.  DIAGNOSIS  Check your breasts monthly. The best time to check your breasts is after your period. If you check them during your period, you are more likely to feel the normal glands enlarged, as a result of the hormonal changes that happen right before your period. If you do not have menstrual periods, check your breasts the first day of  every month. Become familiar with the way your own breasts feel. It is then easier to notice if there are changes, such as more tenderness, a new growth, change in breast size, or a change in a lump that has always been there. All breasts lumps need to be investigated, to rule out breast cancer. See your caregiver as soon as possible, if you find a lump. Most breast lumps are not cancerous. Excellent treatment is available for ones that are.  To make a diagnosis, your caregiver will examine your breasts and may recommend other tests, such as:  Mammogram (breast X-ray).   Ultrasound.   MRI (magnetic resonance imaging).   Removing fluid from the cyst with a fine needle, under local anesthesia (aspiration).   Taking a breast tissue sample (breast biopsy).  Some questions your caregiver will ask are:  What was the date of your last period?   When did the lump show up?   Is there any discharge from your breast?   Is the breast tender or painful?   Are the symptoms in one or both breasts?   Has the lump changed in size from month-to-month? How long has it been present?   Any family history of breast problems?   Any past breast problems?   Any history of breast surgery?   Are you taking any medications?   When was your last mammogram, and where was it done?  TREATMENT   Dietary changes help to prevent or reduce the symptoms of fibrocystic   breast changes.   You may need to stop consuming all foods that contain caffeine, such as chocolate, sodas, coffee, and tea.   Reducing sugar and fat in your diet may also help.   Decrease estrogen in your diet. Some sources include commercially raised meats which contain estrogen. Eliminate other natural estrogens.   Birth control pills can also make symptoms worse.   Natural progesterone cream, applied at a dose of 15 to 20 milligrams per day, from ovulation until a day or two before your period returns, may help with returning to normal  breast tissue over several months. Seek advice from your caregiver.   Over-the-counter pain pills may help, as recommended by your caregiver.   Danazol hormone (female-like hormone) is sometimes used. It may cause hair growth and acne.   Needle aspiration can be used, to remove fluid from the cyst.   Surgery may be needed, to remove a large, persistent, and tender cyst.   Evening primrose oil may help with the tenderness and pain. It has linolenic acid that women may not have enough of.  HOME CARE INSTRUCTIONS   Examine your breasts after every menstrual period.   If you do not have menstrual periods, examine your breasts the first day of every month.   Wear a firm support bra, especially when exercising.   Decrease or avoid caffeine in your diet.   Decrease the fat and sugar in your diet.   Eat a balanced diet.   Try to see your caregiver after you have a menstrual period.   Before seeing your caregiver, make notes about:   When you have the symptoms.   What types of symptoms you are having.   Medications you are taking.   When and where your last mammogram was taken.   Past breast problems or breast surgery.  SEEK MEDICAL CARE IF:   You have been diagnosed with fibrocystic breast changes, and you develop changes in your breast:   Discharge from the nipple, especially bloody discharge.   Pain in the breast that does not go away after your menstrual period.   New lumps or bumps in the breast.   Lumps in your armpit.   Your breast or breasts become enlarged, red, and painful.   You find an isolated lump, even if it is not tender.   You have questions about this condition that have not been answered.  Document Released: 12/13/2005 Document Revised: 02/15/2011 Document Reviewed: 03/09/2009 [employer] Patient Information 2012 [employer]. 

## 2011-05-31 DIAGNOSIS — J209 Acute bronchitis, unspecified: Secondary | ICD-10-CM | POA: Diagnosis not present

## 2011-05-31 DIAGNOSIS — J019 Acute sinusitis, unspecified: Secondary | ICD-10-CM | POA: Diagnosis not present

## 2011-06-13 DIAGNOSIS — N390 Urinary tract infection, site not specified: Secondary | ICD-10-CM | POA: Diagnosis not present

## 2011-08-01 DIAGNOSIS — M719 Bursopathy, unspecified: Secondary | ICD-10-CM | POA: Diagnosis not present

## 2011-08-01 DIAGNOSIS — M67919 Unspecified disorder of synovium and tendon, unspecified shoulder: Secondary | ICD-10-CM | POA: Diagnosis not present

## 2011-08-01 DIAGNOSIS — M9420 Chondromalacia, unspecified site: Secondary | ICD-10-CM | POA: Diagnosis not present

## 2011-08-01 DIAGNOSIS — M5137 Other intervertebral disc degeneration, lumbosacral region: Secondary | ICD-10-CM | POA: Diagnosis not present

## 2011-08-13 ENCOUNTER — Ambulatory Visit: Payer: Medicare Other | Attending: Specialist | Admitting: Physical Therapy

## 2011-08-13 DIAGNOSIS — M25519 Pain in unspecified shoulder: Secondary | ICD-10-CM | POA: Insufficient documentation

## 2011-08-13 DIAGNOSIS — M545 Low back pain, unspecified: Secondary | ICD-10-CM | POA: Diagnosis not present

## 2011-08-13 DIAGNOSIS — IMO0001 Reserved for inherently not codable concepts without codable children: Secondary | ICD-10-CM | POA: Insufficient documentation

## 2011-08-13 DIAGNOSIS — M25569 Pain in unspecified knee: Secondary | ICD-10-CM | POA: Insufficient documentation

## 2011-08-13 DIAGNOSIS — M25559 Pain in unspecified hip: Secondary | ICD-10-CM | POA: Insufficient documentation

## 2011-08-16 ENCOUNTER — Ambulatory Visit: Payer: Medicare Other | Admitting: *Deleted

## 2011-08-20 ENCOUNTER — Ambulatory Visit: Payer: Medicare Other | Admitting: Physical Therapy

## 2011-08-23 ENCOUNTER — Ambulatory Visit: Payer: Medicare Other | Admitting: *Deleted

## 2011-08-27 DIAGNOSIS — I10 Essential (primary) hypertension: Secondary | ICD-10-CM | POA: Diagnosis not present

## 2011-08-27 DIAGNOSIS — E119 Type 2 diabetes mellitus without complications: Secondary | ICD-10-CM | POA: Diagnosis not present

## 2011-08-27 DIAGNOSIS — E559 Vitamin D deficiency, unspecified: Secondary | ICD-10-CM | POA: Diagnosis not present

## 2011-08-27 DIAGNOSIS — E039 Hypothyroidism, unspecified: Secondary | ICD-10-CM | POA: Diagnosis not present

## 2011-08-27 DIAGNOSIS — E785 Hyperlipidemia, unspecified: Secondary | ICD-10-CM | POA: Diagnosis not present

## 2011-08-28 ENCOUNTER — Encounter: Payer: Medicare Other | Admitting: Physical Therapy

## 2011-08-28 DIAGNOSIS — R220 Localized swelling, mass and lump, head: Secondary | ICD-10-CM | POA: Diagnosis not present

## 2011-08-28 DIAGNOSIS — J351 Hypertrophy of tonsils: Secondary | ICD-10-CM | POA: Diagnosis not present

## 2011-08-28 DIAGNOSIS — R221 Localized swelling, mass and lump, neck: Secondary | ICD-10-CM | POA: Diagnosis not present

## 2011-08-30 ENCOUNTER — Ambulatory Visit: Payer: Medicare Other | Admitting: Physical Therapy

## 2011-09-04 ENCOUNTER — Ambulatory Visit: Payer: Medicare Other | Admitting: Physical Therapy

## 2011-09-05 DIAGNOSIS — M67919 Unspecified disorder of synovium and tendon, unspecified shoulder: Secondary | ICD-10-CM | POA: Diagnosis not present

## 2011-09-05 DIAGNOSIS — M719 Bursopathy, unspecified: Secondary | ICD-10-CM | POA: Diagnosis not present

## 2011-09-06 ENCOUNTER — Encounter: Payer: Medicare Other | Admitting: Physical Therapy

## 2011-11-28 DIAGNOSIS — K5900 Constipation, unspecified: Secondary | ICD-10-CM | POA: Diagnosis not present

## 2011-11-28 DIAGNOSIS — Z8371 Family history of colonic polyps: Secondary | ICD-10-CM | POA: Diagnosis not present

## 2011-12-12 DIAGNOSIS — E039 Hypothyroidism, unspecified: Secondary | ICD-10-CM | POA: Diagnosis not present

## 2011-12-12 DIAGNOSIS — E559 Vitamin D deficiency, unspecified: Secondary | ICD-10-CM | POA: Diagnosis not present

## 2011-12-12 DIAGNOSIS — I10 Essential (primary) hypertension: Secondary | ICD-10-CM | POA: Diagnosis not present

## 2011-12-12 DIAGNOSIS — E119 Type 2 diabetes mellitus without complications: Secondary | ICD-10-CM | POA: Diagnosis not present

## 2011-12-12 DIAGNOSIS — E785 Hyperlipidemia, unspecified: Secondary | ICD-10-CM | POA: Diagnosis not present

## 2011-12-12 DIAGNOSIS — R7989 Other specified abnormal findings of blood chemistry: Secondary | ICD-10-CM | POA: Diagnosis not present

## 2011-12-12 DIAGNOSIS — Z23 Encounter for immunization: Secondary | ICD-10-CM | POA: Diagnosis not present

## 2012-02-05 ENCOUNTER — Other Ambulatory Visit: Payer: Self-pay | Admitting: Family Medicine

## 2012-02-05 DIAGNOSIS — Z1231 Encounter for screening mammogram for malignant neoplasm of breast: Secondary | ICD-10-CM

## 2012-03-17 ENCOUNTER — Ambulatory Visit: Payer: Medicare Other

## 2012-03-25 DIAGNOSIS — H6120 Impacted cerumen, unspecified ear: Secondary | ICD-10-CM | POA: Diagnosis not present

## 2012-03-25 DIAGNOSIS — E559 Vitamin D deficiency, unspecified: Secondary | ICD-10-CM | POA: Diagnosis not present

## 2012-03-25 DIAGNOSIS — E785 Hyperlipidemia, unspecified: Secondary | ICD-10-CM | POA: Diagnosis not present

## 2012-03-25 DIAGNOSIS — M25539 Pain in unspecified wrist: Secondary | ICD-10-CM | POA: Diagnosis not present

## 2012-03-25 DIAGNOSIS — I10 Essential (primary) hypertension: Secondary | ICD-10-CM | POA: Diagnosis not present

## 2012-03-25 DIAGNOSIS — E039 Hypothyroidism, unspecified: Secondary | ICD-10-CM | POA: Diagnosis not present

## 2012-03-25 DIAGNOSIS — M545 Low back pain: Secondary | ICD-10-CM | POA: Diagnosis not present

## 2012-03-25 DIAGNOSIS — M25559 Pain in unspecified hip: Secondary | ICD-10-CM | POA: Diagnosis not present

## 2012-04-15 ENCOUNTER — Ambulatory Visit
Admission: RE | Admit: 2012-04-15 | Discharge: 2012-04-15 | Disposition: A | Discharge status: Home / Self Care | Payer: Medicare Other | Source: Ambulatory Visit | Attending: Family Medicine | Admitting: Family Medicine

## 2012-04-15 DIAGNOSIS — Z1231 Encounter for screening mammogram for malignant neoplasm of breast: Secondary | ICD-10-CM

## 2012-05-12 ENCOUNTER — Ambulatory Visit (INDEPENDENT_AMBULATORY_CARE_PROVIDER_SITE_OTHER): Payer: Medicare Other | Admitting: Surgery

## 2012-05-19 DIAGNOSIS — H43819 Vitreous degeneration, unspecified eye: Secondary | ICD-10-CM | POA: Diagnosis not present

## 2012-05-19 DIAGNOSIS — H3530 Unspecified macular degeneration: Secondary | ICD-10-CM | POA: Diagnosis not present

## 2012-05-19 DIAGNOSIS — E119 Type 2 diabetes mellitus without complications: Secondary | ICD-10-CM | POA: Diagnosis not present

## 2012-05-19 DIAGNOSIS — Z961 Presence of intraocular lens: Secondary | ICD-10-CM | POA: Diagnosis not present

## 2012-05-26 ENCOUNTER — Other Ambulatory Visit: Payer: Self-pay | Admitting: *Deleted

## 2012-05-26 DIAGNOSIS — Z780 Asymptomatic menopausal state: Secondary | ICD-10-CM

## 2012-05-29 DIAGNOSIS — M654 Radial styloid tenosynovitis [de Quervain]: Secondary | ICD-10-CM | POA: Diagnosis not present

## 2012-05-29 DIAGNOSIS — M67919 Unspecified disorder of synovium and tendon, unspecified shoulder: Secondary | ICD-10-CM | POA: Diagnosis not present

## 2012-05-29 DIAGNOSIS — M76899 Other specified enthesopathies of unspecified lower limb, excluding foot: Secondary | ICD-10-CM | POA: Diagnosis not present

## 2012-05-29 DIAGNOSIS — M719 Bursopathy, unspecified: Secondary | ICD-10-CM | POA: Diagnosis not present

## 2012-06-03 ENCOUNTER — Other Ambulatory Visit: Payer: Self-pay | Admitting: *Deleted

## 2012-06-03 MED ORDER — ZOLPIDEM TARTRATE 10 MG PO TABS: 10.0000 mg | ORAL_TABLET | Freq: Every evening | ORAL | Status: DC | PRN

## 2012-06-03 NOTE — Addendum Note (Signed)
Addended byDory Peru on: 06/03/2012 02:13 PM   Modules accepted: Orders

## 2012-06-12 ENCOUNTER — Encounter (INDEPENDENT_AMBULATORY_CARE_PROVIDER_SITE_OTHER): Payer: Self-pay

## 2012-06-13 ENCOUNTER — Encounter (INDEPENDENT_AMBULATORY_CARE_PROVIDER_SITE_OTHER): Payer: Self-pay | Admitting: Surgery

## 2012-06-13 ENCOUNTER — Ambulatory Visit (INDEPENDENT_AMBULATORY_CARE_PROVIDER_SITE_OTHER): Payer: Medicare Other | Admitting: Surgery

## 2012-06-13 VITALS — BP 142/72 | HR 68 | Temp 98.0°F | Resp 16 | Ht 65.0 in | Wt 170.0 lb

## 2012-06-13 DIAGNOSIS — N6019 Diffuse cystic mastopathy of unspecified breast: Secondary | ICD-10-CM

## 2012-06-13 NOTE — Patient Instructions (Addendum)
Breast Self-Exam A self breast exam may help you find changes or problems while they are still small. Do a breast self-exam:  Every month.  One week after your period (menstrual period).  On the first day of each month if you do not have periods anymore. Look for any:  Change in breast color, size, or shape.  Dimples in your breast.  Changes in your nipples or skin.  Dry skin on your breasts or nipples.  Watery or bloody discharge from your nipples.  Feel for:  Lumps.  Thick, hard places.  Any other changes. HOME CARE There are 3 ways to do the breast self-exam: In front of a mirror.  Lift your arms over your head and turn side to side.  Put your hands on your hips and lean down, then turn from side to side.  Bend forward and turn from side to side. In the shower.  With soapy hands, check both breasts. Then check above and below your collarbone and your armpits.  Feel above and below your collarbone down to under your breast, and from the center of your chest to the outer edge of the armpit. Check for any lumps or hard spots.  Using the tips of your middle three fingers check your whole breast by pressing your hand over your breast in a circle or in an up and down motion. Lying down.  Lie flat on your bed.  Put a small pillow under the breast you are going to check. On that same side, put your hand behind your head.  With your other hand, use the 3 middle fingers to feel the breast.  Move your fingers in a circle around the breast. Press firmly over all parts of the breast to feel for any lumps. GET HELP RIGHT AWAY IF: You find any changes in your breasts so they can be checked. Document Released: 08/15/2007 Document Revised: 05/21/2011 Document Reviewed: 06/16/2008 [facility] Patient Information 2013 [place], [place].    May use hydrocortisone cream topically for "rash" or scaling lesions under left breast.  tmg

## 2012-06-13 NOTE — Progress Notes (Signed)
General Surgery [facility]  Visit Diagnoses: 1. Fibrocystic breast changes, unspecified laterality     HISTORY: Patient is an 79 year old female followed for many years for history of prior breast surgery in fibrocystic changes in the breast. She returns now for annual followup. Most recent mammogram is dated 04/15/2012. We reviewed the results. There is no evidence of malignancy identified on her most recent study.  PERTINENT REVIEW OF SYSTEMS: Denies new breast masses. Occasional soreness in the left breast. Small skin lesion beneath the left breast.  EXAM: HEENT: normocephalic; pupils equal and reactive; sclerae clear; dentition good; mucous membranes moist NECK:  Well-healed surgical incision; symmetric on extension; no palpable anterior or posterior cervical lymphadenopathy; no supraclavicular masses; no tenderness CHEST: clear to auscultation bilaterally without rales, rhonchi, or wheezes BREAST: Right breast shows diffusely nodular breast tissue without discrete or dominant mass; nipple areolar complex is normal; right axilla without palpable lymphadenopathy; left breast shows mild erythema laterally; inframammary crease with multiple actinic keratoses and nevi, no significant lesions; left axilla without palpable lymphadenopathy. CARDIAC: regular rate and rhythm without significant murmur; peripheral pulses are full EXT:  non-tender without edema; no deformity NEURO: no gross focal deficits; no sign of tremor   IMPRESSION: Fibrocystic changes on examination, benign mammogram, clinically benign breast examination  PLAN: Patient will continue monthly self-examination. She will have annual mammography. We will see her back in one year for physical examination.  Heather Heckler, MD, [facility] Office: [phone]

## 2012-06-19 ENCOUNTER — Encounter: Payer: Self-pay | Admitting: *Deleted

## 2012-07-04 ENCOUNTER — Other Ambulatory Visit: Payer: Self-pay | Admitting: Family Medicine

## 2012-07-23 DIAGNOSIS — M719 Bursopathy, unspecified: Secondary | ICD-10-CM | POA: Diagnosis not present

## 2012-07-23 DIAGNOSIS — M67919 Unspecified disorder of synovium and tendon, unspecified shoulder: Secondary | ICD-10-CM | POA: Diagnosis not present

## 2012-07-24 ENCOUNTER — Encounter: Payer: Self-pay | Admitting: Family Medicine

## 2012-07-24 ENCOUNTER — Ambulatory Visit (INDEPENDENT_AMBULATORY_CARE_PROVIDER_SITE_OTHER): Payer: Medicare Other | Admitting: Family Medicine

## 2012-07-24 VITALS — BP 147/66 | HR 69 | Temp 98.0°F | Ht 65.0 in | Wt 172.0 lb

## 2012-07-24 DIAGNOSIS — R5383 Other fatigue: Secondary | ICD-10-CM | POA: Diagnosis not present

## 2012-07-24 DIAGNOSIS — E559 Vitamin D deficiency, unspecified: Secondary | ICD-10-CM | POA: Diagnosis not present

## 2012-07-24 DIAGNOSIS — R5381 Other malaise: Secondary | ICD-10-CM | POA: Diagnosis not present

## 2012-07-24 DIAGNOSIS — L570 Actinic keratosis: Secondary | ICD-10-CM

## 2012-07-24 DIAGNOSIS — I10 Essential (primary) hypertension: Secondary | ICD-10-CM

## 2012-07-24 DIAGNOSIS — J309 Allergic rhinitis, unspecified: Secondary | ICD-10-CM

## 2012-07-24 DIAGNOSIS — E785 Hyperlipidemia, unspecified: Secondary | ICD-10-CM | POA: Diagnosis not present

## 2012-07-24 LAB — BASIC METABOLIC PANEL WITH GFR
Chloride: 106 mEq/L (ref 96–112)
GFR, Est African American: 75 mL/min
GFR, Est Non African American: 65 mL/min
Glucose, Bld: 119 mg/dL — ABNORMAL HIGH (ref 70–99)
Potassium: 3.8 mEq/L (ref 3.5–5.3)
Sodium: 140 mEq/L (ref 135–145)

## 2012-07-24 LAB — POCT CBC
Granulocyte percent: 60.9 %G (ref 37–80)
HCT, POC: 36.4 % — AB (ref 37.7–47.9)
Hemoglobin: 12.5 g/dL (ref 12.2–16.2)
Lymph, poc: 2.9 (ref 0.6–3.4)
MCH, POC: 32.5 pg — AB (ref 27–31.2)
MCV: 94.3 fL (ref 80–97)
Platelet Count, POC: 161 10*3/uL (ref 142–424)
RBC: 3.9 M/uL — AB (ref 4.04–5.48)

## 2012-07-24 LAB — HEPATIC FUNCTION PANEL
ALT: 8 U/L (ref 0–35)
Alkaline Phosphatase: 58 U/L (ref 39–117)
Bilirubin, Direct: 0.1 mg/dL (ref 0.0–0.3)
Indirect Bilirubin: 0.4 mg/dL (ref 0.0–0.9)
Total Protein: 6.9 g/dL (ref 6.0–8.3)

## 2012-07-24 LAB — THYROID PANEL WITH TSH
Free Thyroxine Index: 4.6 — ABNORMAL HIGH (ref 1.0–3.9)
T3 Uptake: 39.2 % — ABNORMAL HIGH (ref 22.5–37.0)
T4, Total: 11.7 ug/dL (ref 5.0–12.5)

## 2012-07-24 NOTE — Patient Instructions (Signed)
Continue current medications and therapeutic lifestyle changes. Take Claritin as needed for allergy. Always drink plenty of fluids. You may want to consider trying Nasacort AQ over-the-counter 1-2 sprays each nostril daily Always remember if you have any cough that plain Mucinex over-the-counter one twice daily with a large glass of water will help the congestion so you can get it         up

## 2012-07-24 NOTE — Progress Notes (Signed)
Subjective:    Patient ID: [name], female    DOB: 17-Apr-1925, 79 y.o.   MRN: [id number]  HPI This patient presents for recheck of multiple medical problems. Her husband accompanies the patient today.  Patient Active Problem List   Diagnosis Date Noted  . Fibrocystic breast changes 05/21/2011  . Breast cyst   . Unspecified essential hypertension   . Other and unspecified hyperlipidemia   . Peptic ulcer disease   . ITP (idiopathic thrombocytopenic purpura)   . Rhinitis   . Elevated blood sugar   . Unspecified hypothyroidism   . Goiter   . Colon polyps   . Adhesion of abdominal wall   . Esophageal stricture     In addition, see review of systems, allergies and arthralgias seem to be the main complaints.  The allergies, current medications, past medical history, surgical history, family and social history are reviewed.  Immunizations reviewed.  Health maintenance reviewed.  The following items are outstanding: None      Review of Systems  Constitutional: Positive for fatigue.  HENT: Positive for rhinorrhea and sneezing.   Eyes: Positive for itching.  Respiratory: Positive for cough (dry).   Cardiovascular: Negative.   Gastrointestinal: Positive for abdominal pain (occasional due to constipation) and constipation (occasional).  Genitourinary: Negative.   Musculoskeletal: Positive for arthralgias (knees, hips, shoulders).  Skin: Positive for rash (bilateral ears, scaly).  Allergic/Immunologic: Positive for environmental allergies (seasonal).  Neurological: Positive for dizziness. Negative for headaches.  Psychiatric/Behavioral: Positive for sleep disturbance (nightly). The patient is nervous/anxious (occasional).    As of note patient recently had an eye exam and this was okay, other than macular degeneration. She also saw the surgeon regarding her breast and had a mammogram and everything was okay with this exam.    Objective:   Physical Exam BP 147/66   Pulse 69  Temp(Src) 98 F (36.7 C) (Oral)  Ht 5\' 5"  (1.651 m)  Wt 172 lb (78.019 kg)  BMI 28.62 kg/m2  The patient appeared well nourished and normally developed, alert and oriented to time and place. Speech, behavior and judgement appear normal. Vital signs as documented.  Head exam is unremarkable. No scleral icterus or pallor noted. She did have some nasal congestion bilaterally. Neck is without jugular venous distension, thyromegally, or carotid bruits. Carotid upstrokes are brisk bilaterally. No cervical adenopathy. Lungs are clear anteriorly and posteriorly to auscultation. Normal respiratory effort. Cardiac exam reveals regular rate and rhythm at 72 per minute. First and second heart sounds normal.  No murmurs, rubs or gallops.  Abdominal exam reveals normal bowl sounds, no masses, no organomegaly and no aortic enlargement. Scarring noted from previous surgery. Also some slight generalized tenderness but no more than usual. No inguinal adenopathy. Extremities are nonedematous and both femoral and pedal pulses are normal. Skin without pallor or jaundice.  Warm and dry, without rash. There were some dry skin on the pinna of the left ear. This may be an early AK. Neurologic exam reveals normal deep tendon reflexes and normal sensation.          Assessment & Plan:  1. Hyperlipemia - Hepatic function panel; Standing - NMR Lipoprofile with Lipids; Standing - Hepatic function panel - NMR Lipoprofile with Lipids  2. Hypertension - BASIC METABOLIC PANEL WITH GFR; Standing - BASIC METABOLIC PANEL WITH GFR  3. Vitamin D deficiency - Vitamin D 25 hydroxy; Standing - Vitamin D 25 hydroxy  4. Fatigue - POCT CBC; Standing - Thyroid Panel With TSH -  POCT CBC   Patient Instructions  Continue current medications and therapeutic lifestyle changes. Take Claritin as needed for allergy. Always drink plenty of fluids. You may want to consider trying Nasacort AQ over-the-counter 1-2  sprays each nostril daily Always remember if you have any cough that plain Mucinex over-the-counter one twice daily with a large glass of water will help the congestion so you can get it         up

## 2012-07-28 LAB — NMR LIPOPROFILE WITH LIPIDS
Cholesterol, Total: 122 mg/dL (ref <200)
HDL Size: 9.2 nm (ref >=9.2)
HDL-C: 47 mg/dL (ref >=40)
Large HDL-P: 4.4 umol/L — ABNORMAL LOW (ref >=4.8)
Triglycerides: 89 mg/dL (ref <150)

## 2012-08-06 ENCOUNTER — Other Ambulatory Visit: Payer: Self-pay

## 2012-08-06 ENCOUNTER — Ambulatory Visit: Payer: Self-pay

## 2012-08-27 DIAGNOSIS — M67919 Unspecified disorder of synovium and tendon, unspecified shoulder: Secondary | ICD-10-CM | POA: Diagnosis not present

## 2012-08-27 DIAGNOSIS — G5600 Carpal tunnel syndrome, unspecified upper limb: Secondary | ICD-10-CM | POA: Diagnosis not present

## 2012-09-03 ENCOUNTER — Ambulatory Visit: Payer: Self-pay

## 2012-09-03 ENCOUNTER — Other Ambulatory Visit: Payer: Self-pay

## 2012-09-03 DIAGNOSIS — R209 Unspecified disturbances of skin sensation: Secondary | ICD-10-CM | POA: Diagnosis not present

## 2012-09-24 ENCOUNTER — Ambulatory Visit (INDEPENDENT_AMBULATORY_CARE_PROVIDER_SITE_OTHER): Payer: Medicare Other

## 2012-09-24 ENCOUNTER — Encounter: Payer: Self-pay | Admitting: Pharmacist

## 2012-09-24 ENCOUNTER — Ambulatory Visit (INDEPENDENT_AMBULATORY_CARE_PROVIDER_SITE_OTHER): Payer: Medicare Other | Admitting: Pharmacist

## 2012-09-24 VITALS — Ht 65.0 in | Wt 170.0 lb

## 2012-09-24 DIAGNOSIS — Z13820 Encounter for screening for osteoporosis: Secondary | ICD-10-CM

## 2012-09-24 DIAGNOSIS — Z780 Asymptomatic menopausal state: Secondary | ICD-10-CM

## 2012-09-24 DIAGNOSIS — E039 Hypothyroidism, unspecified: Secondary | ICD-10-CM

## 2012-09-24 LAB — THYROID PANEL WITH TSH
Free Thyroxine Index: 6.7 — ABNORMAL HIGH (ref 1.0–3.9)
T3 Uptake: 40.7 % — ABNORMAL HIGH (ref 22.5–37.0)
TSH: 0.053 u[IU]/mL — ABNORMAL LOW (ref 0.350–4.500)

## 2012-09-24 NOTE — Patient Instructions (Addendum)

## 2012-09-24 NOTE — Progress Notes (Signed)
Patient ID: [name], female   DOB: 1925-04-03, 79 y.o.   MRN: [id number] [facility] Current Height: Height: 5\' 5"  (165.1 cm)      Max Lifetime Height:  5\' 5"  Current Weight: Weight: 170 lb (77.111 kg)       Ethnicity:Caucasian    HPI: Does pt already have a diagnosis of:  Osteopenia?  No Osteoporosis?  No  Back Pain?  Yes       Kyphosis?  Yes Prior fracture?  Yes - wrist around 79yo Med(s) for Osteoporosis/Osteopenia:  none Med(s) previously tried for Osteoporosis/Osteopenia:  none                                                             PMH: Age at menopause:  Surgical - 79 years old Hysterectomy?  Yes Oophorectomy?  Patient unsure  HRT? No Steroid Use?  No Thyroid med?  Yes History of cancer?  No History of digestive disorders (ie Crohn's)?  Yes - GERD but uncontrolled with occasional Maloxx use. No longer taking Prilosec/oemprazole Current or previous eating disorders?  No Last Vitamin D Result:  37 (07/2012) Last GFR Result:  65 (07/2012)   FH/SH: Family history of osteoporosis?  No Parent with history of hip fracture?  No Family history of breast cancer?  No Exercise?  No Smoking?  No Alcohol?  No    Calcium Assessment Calcium Intake  # of servings/day  Calcium mg  Milk (8 oz) 0.5  x  300  = 150mg   Yogurt (4 oz) 0 x  200 = 0  Cheese (1 oz) 0.5 x  200 = 100mg   Other Calcium sources   250mg   Ca supplement cirtacal 500mg  = 500mg    Estimated calcium intake per day 1000mg     DEXA Results Date of Test T-Score for AP Spine L1-L4 T-Score for Total Left Hip T-Score for Total Right Hip  09/24/2012 0.5 0.1 -0.1  12/28/2009 0.0 0.0 -0.6  01/14/2008 0.6 0.2 -0.1  11/05/2005 -0.1 0.2 -0.1   Assessment: 1. Osteoporosis screening - BMD normal today 2.  Hypothroidism - synthroid was adjusted 07/2012.  Due to have thyroid panel rechecked   Recommendations: 1.  Rechecking thyroid panel today per labs notes from Dr Christell Constant 2.  recommend calcium 1200mg   daily through supplementation or diet.  3.  recommend weight bearing exercise - 30 minutes at least 4 days  per week.   4.  Counseled and educated about fall risk and prevention.  Recheck DEXA:  2 years  Time spent counseling patient:  20 minutes

## 2012-10-02 ENCOUNTER — Other Ambulatory Visit: Payer: Self-pay | Admitting: Family Medicine

## 2012-10-14 ENCOUNTER — Telehealth: Payer: Self-pay | Admitting: *Deleted

## 2012-10-14 NOTE — Telephone Encounter (Signed)
Message copied by Bearl Mulberry on Tue Oct 14, 2012  6:07 PM ------      Message from: [name]      Created: Wed Sep 24, 2012 11:16 PM       The TSH remains low.      Reconfirm current thyroid medicine and strength and how she is taking it+++++      We may have to reduce it some more ------

## 2012-10-14 NOTE — Telephone Encounter (Signed)
Pt notified of results Pt taking everyday except 1/2 on Saturday

## 2012-10-15 NOTE — Telephone Encounter (Signed)
Decrease dose to 150 mcg daily except one half on Saturday and Sunday

## 2012-10-20 NOTE — Telephone Encounter (Signed)
Patient notified of dosage change.  She stated understanding and agreement to plan.

## 2012-11-05 DIAGNOSIS — M48061 Spinal stenosis, lumbar region without neurogenic claudication: Secondary | ICD-10-CM | POA: Diagnosis not present

## 2012-11-05 DIAGNOSIS — M4310 Spondylolisthesis, site unspecified: Secondary | ICD-10-CM | POA: Diagnosis not present

## 2012-11-11 ENCOUNTER — Encounter: Payer: Self-pay | Admitting: Family Medicine

## 2012-11-11 ENCOUNTER — Ambulatory Visit (INDEPENDENT_AMBULATORY_CARE_PROVIDER_SITE_OTHER): Payer: Medicare Other | Admitting: Family Medicine

## 2012-11-11 VITALS — BP 131/56 | HR 89 | Temp 98.0°F | Ht 65.0 in | Wt 166.0 lb

## 2012-11-11 DIAGNOSIS — B029 Zoster without complications: Secondary | ICD-10-CM | POA: Diagnosis not present

## 2012-11-11 DIAGNOSIS — B0230 Zoster ocular disease, unspecified: Secondary | ICD-10-CM | POA: Diagnosis not present

## 2012-11-11 MED ORDER — ACYCLOVIR 800 MG PO TABS: ORAL_TABLET | ORAL | Status: DC

## 2012-11-11 NOTE — Patient Instructions (Signed)
Shingles Shingles (herpes zoster) is an infection that is caused by the same virus that causes chickenpox (varicella). The infection causes a painful skin rash and fluid-filled blisters, which eventually break open, crust over, and heal. It may occur in any area of the body, but it usually affects only one side of the body or face. The pain of shingles usually lasts about 1 month. However, some people with shingles may develop long-term (chronic) pain in the affected area of the body. Shingles often occurs many years after the person had chickenpox. It is more common:  In people older than 50 years.  In people with weakened immune systems, such as those with HIV, AIDS, or cancer.  In people taking medicines that weaken the immune system, such as transplant medicines.  In people under great stress. CAUSES  Shingles is caused by the varicella zoster virus (VZV), which also causes chickenpox. After a person is infected with the virus, it can remain in the person's body for years in an inactive state (dormant). To cause shingles, the virus reactivates and breaks out as an infection in a nerve root. The virus can be spread from person to person (contagious) through contact with open blisters of the shingles rash. It will only spread to people who have not had chickenpox. When these people are exposed to the virus, they may develop chickenpox. They will not develop shingles. Once the blisters scab over, the person is no longer contagious and cannot spread the virus to others. SYMPTOMS  Shingles shows up in stages. The initial symptoms may be pain, itching, and tingling in an area of the skin. This pain is usually described as burning, stabbing, or throbbing.In a few days or weeks, a painful red rash will appear in the area where the pain, itching, and tingling were felt. The rash is usually on one side of the body in a band or belt-like pattern. Then, the rash usually turns into fluid-filled blisters. They  will scab over and dry up in approximately 2 3 weeks. Flu-like symptoms may also occur with the initial symptoms, the rash, or the blisters. These may include:  Fever.  Chills.  Headache.  Upset stomach. DIAGNOSIS  Your caregiver will perform a skin exam to diagnose shingles. Skin scrapings or fluid samples may also be taken from the blisters. This sample will be examined under a microscope or sent to a lab for further testing. TREATMENT  There is no specific cure for shingles. Your caregiver will likely prescribe medicines to help you manage the pain, recover faster, and avoid long-term problems. This may include antiviral drugs, anti-inflammatory drugs, and pain medicines. HOME CARE INSTRUCTIONS   Take a cool bath or apply cool compresses to the area of the rash or blisters as directed. This may help with the pain and itching.   Only take over-the-counter or prescription medicines as directed by your caregiver.   Rest as directed by your caregiver.  Keep your rash and blisters clean with mild soap and cool water or as directed by your caregiver.  Do not pick your blisters or scratch your rash. Apply an anti-itch cream or numbing creams to the affected area as directed by your caregiver.  Keep your shingles rash covered with a loose bandage (dressing).  Avoid skin contact with:  Babies.   Pregnant women.   Children with eczema.   Elderly people with transplants.   People with chronic illnesses, such as leukemia or AIDS.   Wear loose-fitting clothing to help ease   the pain of material rubbing against the rash.  Keep all follow-up appointments with your caregiver.If the area involved is on your face, you may receive a referral for follow-up to a specialist, such as an eye doctor (ophthalmologist) or an ear, nose, and throat (ENT) doctor. Keeping all follow-up appointments will help you avoid eye complications, chronic pain, or disability.  SEEK IMMEDIATE MEDICAL  CARE IF:   You have facial pain, pain around the eye area, or loss of feeling on one side of your face.  You have ear pain or ringing in your ear.  You have loss of taste.  Your pain is not relieved with prescribed medicines.   Your redness or swelling spreads.   You have more pain and swelling.  Your condition is worsening or has changed.   You have a feveror persistent symptoms for more than 2 3 days.  You have a fever and your symptoms suddenly get worse. MAKE SURE YOU:  Understand these instructions.  Will watch your condition.  Will get help right away if you are not doing well or get worse. Document Released: 02/26/2005 Document Revised: 11/21/2011 Document Reviewed: 10/11/2011 [employer] Patient Information 2014 [employer].  

## 2012-11-11 NOTE — Progress Notes (Signed)
  Subjective:    Patient ID: [name], female    DOB: 1925/08/30, 79 y.o.   MRN: [id number]  HPI This 79 y.o. female presents for evaluation of rash and pain right face and eye. She started having rash and right face pain for 2 days and today she started to Hurt in her right eye..   Review of Systems C/o rash, right face pain, and right eye pain. No chest pain, SOB, HA, dizziness, vision change, N/V, diarrhea, constipation, dysuria, urinary urgency or frequency, myalgias, arthralgias or rash.     Objective:   Physical Exam Vital signs noted  Well developed well nourished female.  HEENT - Head atraumatic Normocephalic                Eyes - PERRLA, Conjuctiva - clear Sclera- Clear EOMI                Ears - EAC's Wnl TM's Wnl Gross Hearing WNL                Nose - Nares patent                 Throat - oropharanx wnl Respiratory - Lungs CTA bilateral Cardiac - RRR S1 and S2 without murmur. Skin - Right face and scalp with erythematous rash.  Right Periobital Area tender and rash over right periorbital region.       Assessment & Plan:  Shingles - Plan: acyclovir (ZOVIRAX) 800 MG tablet one po 5xday for 7 days. Opthamologist office called and patient given appointment today.

## 2012-11-13 ENCOUNTER — Other Ambulatory Visit: Payer: Self-pay | Admitting: Specialist

## 2012-11-13 DIAGNOSIS — M25512 Pain in left shoulder: Secondary | ICD-10-CM

## 2012-11-17 DIAGNOSIS — M48061 Spinal stenosis, lumbar region without neurogenic claudication: Secondary | ICD-10-CM | POA: Diagnosis not present

## 2012-11-17 DIAGNOSIS — IMO0002 Reserved for concepts with insufficient information to code with codable children: Secondary | ICD-10-CM | POA: Diagnosis not present

## 2012-11-18 DIAGNOSIS — B0230 Zoster ocular disease, unspecified: Secondary | ICD-10-CM | POA: Diagnosis not present

## 2012-11-19 ENCOUNTER — Ambulatory Visit
Admission: RE | Admit: 2012-11-19 | Discharge: 2012-11-19 | Disposition: A | Discharge status: Home / Self Care | Payer: Medicare Other | Source: Ambulatory Visit | Attending: Specialist | Admitting: Specialist

## 2012-11-19 DIAGNOSIS — M19019 Primary osteoarthritis, unspecified shoulder: Secondary | ICD-10-CM | POA: Diagnosis not present

## 2012-11-19 DIAGNOSIS — M67919 Unspecified disorder of synovium and tendon, unspecified shoulder: Secondary | ICD-10-CM | POA: Diagnosis not present

## 2012-11-19 DIAGNOSIS — M25512 Pain in left shoulder: Secondary | ICD-10-CM

## 2012-11-20 DIAGNOSIS — M67919 Unspecified disorder of synovium and tendon, unspecified shoulder: Secondary | ICD-10-CM | POA: Diagnosis not present

## 2012-11-20 DIAGNOSIS — M7520 Bicipital tendinitis, unspecified shoulder: Secondary | ICD-10-CM | POA: Diagnosis not present

## 2012-12-01 ENCOUNTER — Ambulatory Visit (INDEPENDENT_AMBULATORY_CARE_PROVIDER_SITE_OTHER): Payer: Medicare Other | Admitting: Family Medicine

## 2012-12-01 ENCOUNTER — Encounter: Payer: Self-pay | Admitting: Family Medicine

## 2012-12-01 VITALS — BP 133/60 | HR 59 | Temp 98.2°F | Ht 65.0 in | Wt 166.0 lb

## 2012-12-01 DIAGNOSIS — G4700 Insomnia, unspecified: Secondary | ICD-10-CM

## 2012-12-01 DIAGNOSIS — E785 Hyperlipidemia, unspecified: Secondary | ICD-10-CM

## 2012-12-01 DIAGNOSIS — E559 Vitamin D deficiency, unspecified: Secondary | ICD-10-CM | POA: Diagnosis not present

## 2012-12-01 DIAGNOSIS — Z23 Encounter for immunization: Secondary | ICD-10-CM | POA: Diagnosis not present

## 2012-12-01 DIAGNOSIS — E039 Hypothyroidism, unspecified: Secondary | ICD-10-CM

## 2012-12-01 DIAGNOSIS — M549 Dorsalgia, unspecified: Secondary | ICD-10-CM

## 2012-12-01 DIAGNOSIS — K279 Peptic ulcer, site unspecified, unspecified as acute or chronic, without hemorrhage or perforation: Secondary | ICD-10-CM | POA: Diagnosis not present

## 2012-12-01 DIAGNOSIS — I10 Essential (primary) hypertension: Secondary | ICD-10-CM

## 2012-12-01 DIAGNOSIS — Z780 Asymptomatic menopausal state: Secondary | ICD-10-CM

## 2012-12-01 DIAGNOSIS — F5104 Psychophysiologic insomnia: Secondary | ICD-10-CM

## 2012-12-01 LAB — POCT CBC
Granulocyte percent: 73.7 %G (ref 37–80)
HCT, POC: 41.9 % (ref 37.7–47.9)
Hemoglobin: 13.8 g/dL (ref 12.2–16.2)
Lymph, poc: 2.6 (ref 0.6–3.4)
MCH, POC: 30.8 pg (ref 27–31.2)
MCHC: 32.9 g/dL (ref 31.8–35.4)
MCV: 93.6 fL (ref 80–97)
MPV: 9.7 fL (ref 0–99.8)
POC Granulocyte: 9.4 — AB (ref 2–6.9)
POC LYMPH PERCENT: 20.6 %L (ref 10–50)
Platelet Count, POC: 135 10*3/uL — AB (ref 142–424)
RBC: 4.5 M/uL (ref 4.04–5.48)
RDW, POC: 13.4 %
WBC: 12.8 10*3/uL — AB (ref 4.6–10.2)

## 2012-12-01 MED ORDER — ZOLPIDEM TARTRATE 10 MG PO TABS: 10.0000 mg | ORAL_TABLET | Freq: Every evening | ORAL | Status: DC | PRN

## 2012-12-01 NOTE — Addendum Note (Signed)
Addended by: Magdalene River on: 12/01/2012 10:50 AM   Modules accepted: Orders

## 2012-12-01 NOTE — Progress Notes (Signed)
Subjective:    Patient ID: [name], female    DOB: February 09, 1926, 79 y.o.   MRN: [id number]  HPI Pt here for follow up of chronic medical problems. : Health maintenance parameters the patient is up-to-date on everything except for lab work, FOBT and DEXA scan. See also the review of systems. Patient Active Problem List   Diagnosis Date Noted  . Fibrocystic breast changes 05/21/2011  . Breast cyst   . Unspecified essential hypertension   . Other and unspecified hyperlipidemia   . Peptic ulcer disease   . ITP (idiopathic thrombocytopenic purpura)   . Rhinitis   . Elevated blood sugar   . Unspecified hypothyroidism   . Goiter   . Colon polyps   . Adhesion of abdominal wall   . Esophageal stricture    Outpatient Encounter Prescriptions as of 12/01/2012  Medication Sig Dispense Refill  . acetaminophen-codeine (TYLENOL #3) 300-30 MG per tablet       . atorvastatin (LIPITOR) 80 MG tablet Take 80 mg by mouth daily.        . B Complex-C-Folic Acid (MULTIVITAMIN, STRESS FORMULA) tablet Take 1 tablet by mouth daily.        . calcium citrate-vitamin D (CITRACAL+D) 315-200 MG-UNIT per tablet Take 1 tablet by mouth daily.      . Cholecalciferol (VITAMIN D3) 2000 UNITS TABS Take 1 tablet by mouth daily.        Marland Kitchen levothyroxine (SYNTHROID, LEVOTHROID) 150 MCG tablet Take 150 mcg by mouth daily. 1 tablet daily except 1/2 on Sat & Sun      . valsartan-hydrochlorothiazide (DIOVAN-HCT) 320-25 MG per tablet TAKE 1 TABLET DAILY  30 tablet  3  . zolpidem (AMBIEN) 10 MG tablet Take 1 tablet (10 mg total) by mouth at bedtime as needed.  30 tablet  5  . [DISCONTINUED] acetaminophen (TYLENOL) 500 MG tablet Take 500 mg by mouth every 6 (six) hours as needed.        . [DISCONTINUED] acyclovir (ZOVIRAX) 800 MG tablet One po 5x day for 7 days  35 tablet  0   No facility-administered encounter medications on file as of 12/01/2012.       Review of Systems  Constitutional: Negative.   HENT: Negative.    Eyes: Negative.   Respiratory: Negative.   Cardiovascular: Negative.   Gastrointestinal: Positive for abdominal pain (after eating).  Endocrine: Negative.   Genitourinary: Negative.   Musculoskeletal: Negative.   Skin: Negative.   Allergic/Immunologic: Negative.   Neurological: Negative.        Recent herpes zosta.  Hematological: Negative.   Psychiatric/Behavioral: The patient is nervous/anxious (worried about son- had stroke).        Objective:   Physical Exam  Constitutional: She is oriented to person, place, and time. She appears well-developed and well-nourished. No distress.  HENT:  Head: Normocephalic and atraumatic.  Nose: Nose normal.  Mouth/Throat: Oropharynx is clear and moist. No oropharyngeal exudate.  Bilateral ear cerumen  Eyes: Conjunctivae and EOM are normal. Pupils are equal, round, and reactive to light. Right eye exhibits no discharge. Left eye exhibits no discharge. No scleral icterus.  Neck: Normal range of motion. Neck supple. No thyromegaly present.  Cardiovascular: Normal rate, regular rhythm and normal heart sounds.  Exam reveals no friction rub.   No murmur heard. At 72 per minute  Pulmonary/Chest: Effort normal and breath sounds normal. No respiratory distress. She has no wheezes. She has no rales.  Abdominal: Soft. Bowel sounds are normal. She  exhibits no distension and no mass. There is no tenderness. There is no guarding.  Musculoskeletal: Normal range of motion. She exhibits no edema.  Lymphadenopathy:    She has no cervical adenopathy.  Neurological: She is alert and oriented to person, place, and time. She has normal reflexes.  Skin: Skin is warm and dry.  Psychiatric: She has a normal mood and affect. Her behavior is normal. Judgment and thought content normal.    BP 133/60  Pulse 59  Temp(Src) 98.2 F (36.8 C) (Oral)  Ht 5\' 5"  (1.651 m)  Wt 166 lb (75.297 kg)  BMI 27.62 kg/m2       Assessment & Plan:   1. Unspecified essential  hypertension   2. Other and unspecified hyperlipidemia   3. Unspecified hypothyroidism   4. Vitamin D deficiency   5. Chronic insomnia   6. Peptic ulcer disease    Orders Placed This Encounter  Procedures  . BMP8+EGFR  . Hepatic function panel  . NMR, lipoprofile  . POCT CBC   Meds ordered this encounter  Medications  . acetaminophen-codeine (TYLENOL #3) 300-30 MG per tablet    Sig:    We will also refill her Ambien  Patient Instructions  Continue current medications. Continue good therapeutic lifestyle changes.  Fall precautions discussed with patient Follow up as planned and earlier as needed.  Flu shot will be given today    Nyra Capes MD

## 2012-12-01 NOTE — Patient Instructions (Addendum)
Continue current medications. Continue good therapeutic lifestyle changes.  Fall precautions discussed with patient Follow up as planned and earlier as needed.  Flu shot will be given today

## 2012-12-03 LAB — BMP8+EGFR
BUN: 18 mg/dL (ref 8–27)
Calcium: 9.9 mg/dL (ref 8.6–10.2)
Chloride: 99 mmol/L (ref 97–108)
GFR calc non Af Amer: 59 mL/min/{1.73_m2} — ABNORMAL LOW (ref >59)
Glucose: 128 mg/dL — ABNORMAL HIGH (ref 65–99)
Potassium: 4.4 mmol/L (ref 3.5–5.2)

## 2012-12-03 LAB — NMR, LIPOPROFILE
Cholesterol: 155 mg/dL (ref <200)
HDL Cholesterol by NMR: 61 mg/dL (ref >=40)
LDL Particle Number: 1108 nmol/L — ABNORMAL HIGH (ref <1000)
LDL Size: 20.1 nm — ABNORMAL LOW (ref >20.5)
LDLC SERPL CALC-MCNC: 70 mg/dL (ref <100)
Triglycerides by NMR: 122 mg/dL (ref <150)

## 2012-12-03 LAB — HEPATIC FUNCTION PANEL
ALT: 12 IU/L (ref 0–32)
Alkaline Phosphatase: 74 IU/L (ref 39–117)
Total Bilirubin: 0.6 mg/dL (ref 0.0–1.2)
Total Protein: 6.9 g/dL (ref 6.0–8.5)

## 2012-12-15 ENCOUNTER — Ambulatory Visit (INDEPENDENT_AMBULATORY_CARE_PROVIDER_SITE_OTHER): Payer: Medicare Other | Admitting: Family Medicine

## 2012-12-15 ENCOUNTER — Encounter: Payer: Self-pay | Admitting: Family Medicine

## 2012-12-15 VITALS — BP 130/66 | HR 63 | Temp 98.4°F | Ht <= 58 in | Wt 166.0 lb

## 2012-12-15 DIAGNOSIS — H6120 Impacted cerumen, unspecified ear: Secondary | ICD-10-CM | POA: Diagnosis not present

## 2012-12-15 DIAGNOSIS — H6123 Impacted cerumen, bilateral: Secondary | ICD-10-CM

## 2012-12-15 NOTE — Patient Instructions (Addendum)
Continue current medications. Continue good therapeutic lifestyle changes.  Fall precautions discussed with patient. Follow up as planned and earlier as needed.  Continue to use tear drops periodically to help keep the wax soft so removal will be easier in the future

## 2012-12-15 NOTE — Progress Notes (Signed)
  Subjective:    Patient ID: [name], female    DOB: [birth year], 79 y.o.   MRN: [id number]  HPI Pt here for ear wash. Patient has been using drops at home to help soften the wax for easier removal with irrigation.    Patient Active Problem List   Diagnosis Date Noted  . Vitamin D deficiency 12/01/2012  . Chronic insomnia 12/01/2012  . Fibrocystic breast changes 05/21/2011  . Hypertension   . Hyperlipidemia   . Peptic ulcer disease   . ITP (idiopathic thrombocytopenic purpura)   . Rhinitis   . Elevated blood sugar   . Hypothyroidism   . Goiter   . Colon polyps   . Adhesion of abdominal wall   . Esophageal stricture    Outpatient Encounter Prescriptions as of 12/15/2012  Medication Sig Dispense Refill  . acetaminophen-codeine (TYLENOL #3) 300-30 MG per tablet       . atorvastatin (LIPITOR) 80 MG tablet Take 80 mg by mouth daily.        . B Complex-C-Folic Acid (MULTIVITAMIN, STRESS FORMULA) tablet Take 1 tablet by mouth daily.        . calcium citrate-vitamin D (CITRACAL+D) 315-200 MG-UNIT per tablet Take 1 tablet by mouth daily.      . Cholecalciferol (VITAMIN D3) 2000 UNITS TABS Take 1 tablet by mouth daily.        Marland Kitchen levothyroxine (SYNTHROID, LEVOTHROID) 150 MCG tablet Take 150 mcg by mouth daily. 1 tablet daily except 1/2 on Sat & Sun      . valsartan-hydrochlorothiazide (DIOVAN-HCT) 320-25 MG per tablet TAKE 1 TABLET DAILY  30 tablet  3  . zolpidem (AMBIEN) 10 MG tablet Take 1 tablet (10 mg total) by mouth at bedtime as needed.  30 tablet  5   No facility-administered encounter medications on file as of 12/15/2012.    Review of Systems  Constitutional: Negative.   HENT: Negative.        Impaction in both ears  Eyes: Negative.   Respiratory: Negative.   Cardiovascular: Negative.   Gastrointestinal: Negative.   Endocrine: Negative.   Genitourinary: Negative.   Musculoskeletal: Negative.   Skin: Negative.   Allergic/Immunologic: Negative.   Neurological:  Negative.   Hematological: Negative.   Psychiatric/Behavioral: Negative.        Objective:   Physical Exam BP 130/66  Pulse 63  Temp(Src) 98.4 F (36.9 C) (Oral)  Ht 1\' 2"  (0.356 m)  Wt 166 lb (75.297 kg)  BMI 594.12 kg/m2  Ears cerumen successfully removed with irrigation bilaterally. Ear canals are clear TMs are normal. The patient indicates that her hearing and ears feel better following the procedure.      Assessment & Plan:   1. Excessive cerumen in ear canal, bilateral    Patient Instructions  Continue current medications. Continue good therapeutic lifestyle changes.  Fall precautions discussed with patient. Follow up as planned and earlier as needed.  Continue to use tear drops periodically to help keep the wax soft so removal will be easier in the future   Nyra Capes MD

## 2012-12-17 ENCOUNTER — Other Ambulatory Visit: Payer: Medicare Other

## 2012-12-17 ENCOUNTER — Ambulatory Visit: Payer: Medicare Other

## 2012-12-25 DIAGNOSIS — M7520 Bicipital tendinitis, unspecified shoulder: Secondary | ICD-10-CM | POA: Diagnosis not present

## 2012-12-25 DIAGNOSIS — M67919 Unspecified disorder of synovium and tendon, unspecified shoulder: Secondary | ICD-10-CM | POA: Diagnosis not present

## 2012-12-27 ENCOUNTER — Other Ambulatory Visit: Payer: Self-pay | Admitting: Family Medicine

## 2013-01-12 ENCOUNTER — Ambulatory Visit: Payer: Medicare Other | Attending: Specialist | Admitting: Physical Therapy

## 2013-01-12 DIAGNOSIS — M25619 Stiffness of unspecified shoulder, not elsewhere classified: Secondary | ICD-10-CM | POA: Diagnosis not present

## 2013-01-12 DIAGNOSIS — IMO0001 Reserved for inherently not codable concepts without codable children: Secondary | ICD-10-CM | POA: Diagnosis not present

## 2013-01-12 DIAGNOSIS — R5381 Other malaise: Secondary | ICD-10-CM | POA: Insufficient documentation

## 2013-01-12 DIAGNOSIS — M25519 Pain in unspecified shoulder: Secondary | ICD-10-CM | POA: Insufficient documentation

## 2013-01-14 ENCOUNTER — Ambulatory Visit: Payer: Medicare Other

## 2013-01-14 DIAGNOSIS — R5381 Other malaise: Secondary | ICD-10-CM | POA: Diagnosis not present

## 2013-01-14 DIAGNOSIS — M25619 Stiffness of unspecified shoulder, not elsewhere classified: Secondary | ICD-10-CM | POA: Diagnosis not present

## 2013-01-14 DIAGNOSIS — IMO0001 Reserved for inherently not codable concepts without codable children: Secondary | ICD-10-CM | POA: Diagnosis not present

## 2013-01-14 DIAGNOSIS — M25519 Pain in unspecified shoulder: Secondary | ICD-10-CM | POA: Diagnosis not present

## 2013-01-19 ENCOUNTER — Ambulatory Visit: Payer: Medicare Other

## 2013-01-19 DIAGNOSIS — IMO0001 Reserved for inherently not codable concepts without codable children: Secondary | ICD-10-CM | POA: Diagnosis not present

## 2013-01-19 DIAGNOSIS — R5381 Other malaise: Secondary | ICD-10-CM | POA: Diagnosis not present

## 2013-01-19 DIAGNOSIS — M25519 Pain in unspecified shoulder: Secondary | ICD-10-CM | POA: Diagnosis not present

## 2013-01-19 DIAGNOSIS — M25619 Stiffness of unspecified shoulder, not elsewhere classified: Secondary | ICD-10-CM | POA: Diagnosis not present

## 2013-01-21 ENCOUNTER — Ambulatory Visit: Payer: Medicare Other

## 2013-01-21 DIAGNOSIS — M25519 Pain in unspecified shoulder: Secondary | ICD-10-CM | POA: Diagnosis not present

## 2013-01-21 DIAGNOSIS — R5381 Other malaise: Secondary | ICD-10-CM | POA: Diagnosis not present

## 2013-01-21 DIAGNOSIS — IMO0001 Reserved for inherently not codable concepts without codable children: Secondary | ICD-10-CM | POA: Diagnosis not present

## 2013-01-21 DIAGNOSIS — M25619 Stiffness of unspecified shoulder, not elsewhere classified: Secondary | ICD-10-CM | POA: Diagnosis not present

## 2013-01-22 DIAGNOSIS — M67919 Unspecified disorder of synovium and tendon, unspecified shoulder: Secondary | ICD-10-CM | POA: Diagnosis not present

## 2013-01-22 DIAGNOSIS — M7520 Bicipital tendinitis, unspecified shoulder: Secondary | ICD-10-CM | POA: Diagnosis not present

## 2013-01-26 ENCOUNTER — Encounter: Payer: Medicare Other | Admitting: Physical Therapy

## 2013-01-27 ENCOUNTER — Ambulatory Visit: Payer: Medicare Other | Admitting: *Deleted

## 2013-01-27 DIAGNOSIS — M25619 Stiffness of unspecified shoulder, not elsewhere classified: Secondary | ICD-10-CM | POA: Diagnosis not present

## 2013-01-27 DIAGNOSIS — IMO0001 Reserved for inherently not codable concepts without codable children: Secondary | ICD-10-CM | POA: Diagnosis not present

## 2013-01-27 DIAGNOSIS — M25519 Pain in unspecified shoulder: Secondary | ICD-10-CM | POA: Diagnosis not present

## 2013-01-27 DIAGNOSIS — R5381 Other malaise: Secondary | ICD-10-CM | POA: Diagnosis not present

## 2013-01-28 ENCOUNTER — Ambulatory Visit: Payer: Medicare Other | Admitting: Physical Therapy

## 2013-01-28 DIAGNOSIS — M25519 Pain in unspecified shoulder: Secondary | ICD-10-CM | POA: Diagnosis not present

## 2013-01-28 DIAGNOSIS — M25619 Stiffness of unspecified shoulder, not elsewhere classified: Secondary | ICD-10-CM | POA: Diagnosis not present

## 2013-01-28 DIAGNOSIS — IMO0001 Reserved for inherently not codable concepts without codable children: Secondary | ICD-10-CM | POA: Diagnosis not present

## 2013-01-28 DIAGNOSIS — R5381 Other malaise: Secondary | ICD-10-CM | POA: Diagnosis not present

## 2013-02-02 ENCOUNTER — Ambulatory Visit: Payer: Medicare Other | Admitting: Physical Therapy

## 2013-02-02 DIAGNOSIS — M25619 Stiffness of unspecified shoulder, not elsewhere classified: Secondary | ICD-10-CM | POA: Diagnosis not present

## 2013-02-02 DIAGNOSIS — R5381 Other malaise: Secondary | ICD-10-CM | POA: Diagnosis not present

## 2013-02-02 DIAGNOSIS — IMO0001 Reserved for inherently not codable concepts without codable children: Secondary | ICD-10-CM | POA: Diagnosis not present

## 2013-02-02 DIAGNOSIS — M25519 Pain in unspecified shoulder: Secondary | ICD-10-CM | POA: Diagnosis not present

## 2013-02-04 ENCOUNTER — Ambulatory Visit: Payer: Medicare Other | Admitting: Physical Therapy

## 2013-02-04 DIAGNOSIS — M25619 Stiffness of unspecified shoulder, not elsewhere classified: Secondary | ICD-10-CM | POA: Diagnosis not present

## 2013-02-04 DIAGNOSIS — R5381 Other malaise: Secondary | ICD-10-CM | POA: Diagnosis not present

## 2013-02-04 DIAGNOSIS — M25519 Pain in unspecified shoulder: Secondary | ICD-10-CM | POA: Diagnosis not present

## 2013-02-04 DIAGNOSIS — IMO0001 Reserved for inherently not codable concepts without codable children: Secondary | ICD-10-CM | POA: Diagnosis not present

## 2013-02-09 ENCOUNTER — Other Ambulatory Visit: Payer: Self-pay | Admitting: Nurse Practitioner

## 2013-02-19 DIAGNOSIS — M4310 Spondylolisthesis, site unspecified: Secondary | ICD-10-CM | POA: Diagnosis not present

## 2013-02-19 DIAGNOSIS — M48061 Spinal stenosis, lumbar region without neurogenic claudication: Secondary | ICD-10-CM | POA: Diagnosis not present

## 2013-02-20 ENCOUNTER — Other Ambulatory Visit: Payer: Self-pay | Admitting: Specialist

## 2013-02-20 DIAGNOSIS — M48061 Spinal stenosis, lumbar region without neurogenic claudication: Secondary | ICD-10-CM

## 2013-02-26 ENCOUNTER — Ambulatory Visit
Admission: RE | Admit: 2013-02-26 | Discharge: 2013-02-26 | Disposition: A | Discharge status: Home / Self Care | Payer: Medicare Other | Source: Ambulatory Visit | Attending: Specialist | Admitting: Specialist

## 2013-02-26 DIAGNOSIS — M48061 Spinal stenosis, lumbar region without neurogenic claudication: Secondary | ICD-10-CM | POA: Diagnosis not present

## 2013-02-26 MED ORDER — GADOBENATE DIMEGLUMINE 529 MG/ML IV SOLN
15.0000 mL | Freq: Once | INTRAVENOUS | Status: AC | PRN
  Administered 2013-02-26: 15 mL via INTRAVENOUS

## 2013-03-17 ENCOUNTER — Encounter: Payer: Self-pay | Admitting: Family Medicine

## 2013-03-17 ENCOUNTER — Ambulatory Visit (INDEPENDENT_AMBULATORY_CARE_PROVIDER_SITE_OTHER): Payer: Medicare Other | Admitting: Family Medicine

## 2013-03-17 VITALS — BP 131/68 | HR 56 | Temp 97.3°F | Ht 65.0 in | Wt 165.0 lb

## 2013-03-17 DIAGNOSIS — E8881 Metabolic syndrome: Secondary | ICD-10-CM | POA: Insufficient documentation

## 2013-03-17 DIAGNOSIS — Z6825 Body mass index (BMI) 25.0-25.9, adult: Secondary | ICD-10-CM

## 2013-03-17 DIAGNOSIS — I10 Essential (primary) hypertension: Secondary | ICD-10-CM

## 2013-03-17 DIAGNOSIS — R739 Hyperglycemia, unspecified: Secondary | ICD-10-CM

## 2013-03-17 DIAGNOSIS — E049 Nontoxic goiter, unspecified: Secondary | ICD-10-CM | POA: Diagnosis not present

## 2013-03-17 DIAGNOSIS — E559 Vitamin D deficiency, unspecified: Secondary | ICD-10-CM | POA: Diagnosis not present

## 2013-03-17 DIAGNOSIS — E785 Hyperlipidemia, unspecified: Secondary | ICD-10-CM | POA: Diagnosis not present

## 2013-03-17 DIAGNOSIS — M545 Low back pain, unspecified: Secondary | ICD-10-CM

## 2013-03-17 DIAGNOSIS — G629 Polyneuropathy, unspecified: Secondary | ICD-10-CM | POA: Insufficient documentation

## 2013-03-17 DIAGNOSIS — E039 Hypothyroidism, unspecified: Secondary | ICD-10-CM | POA: Diagnosis not present

## 2013-03-17 DIAGNOSIS — Z23 Encounter for immunization: Secondary | ICD-10-CM

## 2013-03-17 DIAGNOSIS — R7309 Other abnormal glucose: Secondary | ICD-10-CM

## 2013-03-17 DIAGNOSIS — G589 Mononeuropathy, unspecified: Secondary | ICD-10-CM

## 2013-03-17 DIAGNOSIS — E663 Overweight: Secondary | ICD-10-CM | POA: Insufficient documentation

## 2013-03-17 LAB — POCT CBC
Granulocyte percent: 58.5 %G (ref 37–80)
HCT, POC: 44.3 % (ref 37.7–47.9)
HEMOGLOBIN: 13.9 g/dL (ref 12.2–16.2)
Lymph, poc: 3.1 (ref 0.6–3.4)
MCH, POC: 29.5 pg (ref 27–31.2)
MCHC: 31.4 g/dL — AB (ref 31.8–35.4)
MCV: 93 fL (ref 80–97)
MPV: 9.9 fL (ref 0–99.8)
POC Granulocyte: 5 (ref 2–6.9)
POC LYMPH PERCENT: 36.2 %L (ref 10–50)
Platelet Count, POC: 165 10*3/uL (ref 142–424)
RBC: 4.7 M/uL (ref 4.04–5.48)
RDW, POC: 12.9 %
WBC: 8.6 10*3/uL (ref 4.6–10.2)

## 2013-03-17 LAB — POCT GLYCOSYLATED HEMOGLOBIN (HGB A1C): Hemoglobin A1C: 5.8

## 2013-03-17 NOTE — Progress Notes (Signed)
Subjective:    Patient ID: [name], female    DOB: Jun 09, 1925, 80 y.o.   MRN: [id number]  HPI Pt here for follow up and management of chronic medical problems. Patient complains of arthralgias and hip pain today. The patient has been seeing Dr. Louanne Skye with [facility] orthopedics and has had a recent MRI of her hip and back. She will see him again this week and he will most likely be considering some injections in her back. On health maintenance issues the patient is due to have a pelvic exam or lab work and FOBT. She is also due to get a mammogram sometime in February.       Patient Active Problem List   Diagnosis Date Noted  . Vitamin D deficiency 12/01/2012  . Chronic insomnia 12/01/2012  . Fibrocystic breast changes 05/21/2011  . Hypertension   . Hyperlipidemia   . Peptic ulcer disease   . ITP (idiopathic thrombocytopenic purpura)   . Rhinitis   . Elevated blood sugar   . Hypothyroidism   . Goiter   . Colon polyps   . Adhesion of abdominal wall   . Esophageal stricture    Outpatient Encounter Prescriptions as of 03/17/2013  Medication Sig  . acetaminophen-codeine (TYLENOL #3) 300-30 MG per tablet   . atorvastatin (LIPITOR) 80 MG tablet TAKE 1 TABLET ONCE A DAY  . B Complex-C-Folic Acid (MULTIVITAMIN, STRESS FORMULA) tablet Take 1 tablet by mouth daily.    . calcium citrate-vitamin D (CITRACAL+D) 315-200 MG-UNIT per tablet Take 1 tablet by mouth daily.  . Cholecalciferol (VITAMIN D3) 2000 UNITS TABS Take 1 tablet by mouth daily.    Marland Kitchen levothyroxine (SYNTHROID, LEVOTHROID) 150 MCG tablet Take 150 mcg by mouth daily. 1 tablet daily except 1/2 on Sat & Sun  . valsartan-hydrochlorothiazide (DIOVAN-HCT) 320-25 MG per tablet TAKE 1 TABLET DAILY  . zolpidem (AMBIEN) 10 MG tablet Take 1 tablet (10 mg total) by mouth at bedtime as needed.    Review of Systems  Constitutional: Negative.   HENT: Positive for trouble swallowing (usually while eating ).   Eyes: Negative.     Respiratory: Negative.   Cardiovascular: Negative.   Gastrointestinal: Negative.   Endocrine: Negative.   Genitourinary: Negative.   Musculoskeletal: Positive for arthralgias (hip pain) and back pain.  Skin: Negative.   Allergic/Immunologic: Negative.   Neurological: Negative.   Hematological: Negative.   Psychiatric/Behavioral: Negative.        Objective:   Physical Exam  Nursing note and vitals reviewed. Constitutional: She is oriented to person, place, and time. She appears well-developed and well-nourished. No distress.  Pleasant elderly  HENT:  Head: Normocephalic and atraumatic.  Right Ear: External ear normal.  Left Ear: External ear normal.  Nose: Nose normal.  Mouth/Throat: Oropharynx is clear and moist. No oropharyngeal exudate.  Eyes: Conjunctivae and EOM are normal. Pupils are equal, round, and reactive to light. Right eye exhibits no discharge. Left eye exhibits no discharge. No scleral icterus.  Neck: Normal range of motion. Neck supple. No thyromegaly present.  Cardiovascular: Normal rate, regular rhythm, normal heart sounds and intact distal pulses.  Exam reveals no gallop and no friction rub.   No murmur heard. At 72 per minute  Pulmonary/Chest: Effort normal and breath sounds normal. No respiratory distress. She has no wheezes. She has no rales. She exhibits no tenderness.  Abdominal: Soft. Bowel sounds are normal. She exhibits no distension and no mass. There is no tenderness. There is no rebound and no  guarding.  Multiple scars on her abdomen from previous surgeries in the past  Musculoskeletal: Normal range of motion. She exhibits no edema and no tenderness.  Lymphadenopathy:    She has no cervical adenopathy.  Neurological: She is alert and oriented to person, place, and time. She has normal reflexes. No cranial nerve deficit.  Skin: Skin is warm and dry.  Psychiatric: She has a normal mood and affect. Her behavior is normal. Judgment and thought content  normal.   BP 131/68  Pulse 56  Temp(Src) 97.3 F (36.3 C) (Oral)  Ht _0  (1.651 m)  Wt 165 lb (74.844 kg)  BMI 27.46 kg/m2        Assessment & Plan:  1. Goiter - POCT CBC  2. Hyperlipidemia - POCT CBC - NMR, lipoprofile  3. Hypertension - POCT CBC - Hepatic function panel - BMP8+EGFR  4. Hypothyroidism - POCT CBC  5. Vitamin D deficiency - POCT CBC - Vit D  25 hydroxy (rtn osteoporosis monitoring)  6. Elevated blood sugar - POCT CBC - POCT glycosylated hemoglobin (Hb A1C)  7. Low back pain  8. Neuropathy Patient Instructions  Continue current medications. Continue good therapeutic lifestyle changes which include good diet and exercise. Fall precautions discussed with patient. Schedule your flu vaccine if you haven't had it yet If you are over 69 years old - you may need Prevnar 66 or the adult Pneumonia vaccine. Followup with the orthopedist as planned Please make sure that our office gets a copy of the MRI report Most importantly to prevent falls, moves slowly usually her cane as needed   Arrie Senate MD

## 2013-03-17 NOTE — Patient Instructions (Addendum)
Continue current medications. Continue good therapeutic lifestyle changes which include good diet and exercise. Fall precautions discussed with patient. Schedule your flu vaccine if you haven't had it yet If you are over 78 years old - you may need Prevnar 28 or the adult Pneumonia vaccine. Followup with the orthopedist as planned Please make sure that our office gets a copy of the MRI report Most importantly to prevent falls, moves slowly usually her cane as needed

## 2013-03-18 LAB — HEPATIC FUNCTION PANEL
ALK PHOS: 76 IU/L (ref 39–117)
ALT: 7 IU/L (ref 0–32)
AST: 20 IU/L (ref 0–40)
Albumin: 4.3 g/dL (ref 3.5–4.7)
BILIRUBIN DIRECT: 0.17 mg/dL (ref 0.00–0.40)
TOTAL PROTEIN: 7.2 g/dL (ref 6.0–8.5)
Total Bilirubin: 0.6 mg/dL (ref 0.0–1.2)

## 2013-03-18 LAB — NMR, LIPOPROFILE
Cholesterol: 121 mg/dL (ref <200)
HDL Cholesterol by NMR: 51 mg/dL (ref >=40)
HDL Particle Number: 41.2 umol/L (ref >=30.5)
LDL Particle Number: 1012 nmol/L — ABNORMAL HIGH (ref <1000)
LDL SIZE: 20.8 nm (ref >20.5)
LDLC SERPL CALC-MCNC: 49 mg/dL (ref <100)
LP-IR SCORE: 60 — AB (ref <=45)
Small LDL Particle Number: 672 nmol/L — ABNORMAL HIGH (ref <=527)
Triglycerides by NMR: 104 mg/dL (ref <150)

## 2013-03-18 LAB — BMP8+EGFR
BUN/Creatinine Ratio: 14 (ref 11–26)
BUN: 14 mg/dL (ref 8–27)
CHLORIDE: 98 mmol/L (ref 97–108)
CO2: 25 mmol/L (ref 18–29)
Calcium: 9.8 mg/dL (ref 8.6–10.2)
Creatinine, Ser: 0.98 mg/dL (ref 0.57–1.00)
GFR calc non Af Amer: 53 mL/min/{1.73_m2} — ABNORMAL LOW (ref >59)
GFR, EST AFRICAN AMERICAN: 61 mL/min/{1.73_m2} (ref >59)
GLUCOSE: 121 mg/dL — AB (ref 65–99)
POTASSIUM: 3.8 mmol/L (ref 3.5–5.2)
Sodium: 142 mmol/L (ref 134–144)

## 2013-03-18 LAB — VITAMIN D 25 HYDROXY (VIT D DEFICIENCY, FRACTURES): Vit D, 25-Hydroxy: 37 ng/mL (ref 30.0–100.0)

## 2013-03-18 NOTE — Addendum Note (Signed)
Addended by: Zannie Cove on: 03/18/2013 04:37 PM   Modules accepted: Orders

## 2013-03-19 DIAGNOSIS — M542 Cervicalgia: Secondary | ICD-10-CM | POA: Diagnosis not present

## 2013-03-19 DIAGNOSIS — M48061 Spinal stenosis, lumbar region without neurogenic claudication: Secondary | ICD-10-CM | POA: Diagnosis not present

## 2013-03-27 DIAGNOSIS — H01009 Unspecified blepharitis unspecified eye, unspecified eyelid: Secondary | ICD-10-CM | POA: Diagnosis not present

## 2013-03-30 DIAGNOSIS — M48061 Spinal stenosis, lumbar region without neurogenic claudication: Secondary | ICD-10-CM | POA: Diagnosis not present

## 2013-03-30 DIAGNOSIS — M4310 Spondylolisthesis, site unspecified: Secondary | ICD-10-CM | POA: Diagnosis not present

## 2013-04-07 ENCOUNTER — Other Ambulatory Visit: Payer: Medicare Other | Admitting: Nurse Practitioner

## 2013-04-28 ENCOUNTER — Other Ambulatory Visit: Payer: Medicare Other | Admitting: Nurse Practitioner

## 2013-05-12 ENCOUNTER — Ambulatory Visit (INDEPENDENT_AMBULATORY_CARE_PROVIDER_SITE_OTHER): Payer: Medicare Other | Admitting: Nurse Practitioner

## 2013-05-12 VITALS — BP 142/72 | HR 101 | Temp 99.0°F

## 2013-05-12 DIAGNOSIS — R509 Fever, unspecified: Secondary | ICD-10-CM

## 2013-05-12 DIAGNOSIS — K5732 Diverticulitis of large intestine without perforation or abscess without bleeding: Secondary | ICD-10-CM

## 2013-05-12 LAB — POCT CBC
Granulocyte percent: 84.5 %G — AB (ref 37–80)
HCT, POC: 43 % (ref 37.7–47.9)
HEMOGLOBIN: 13.7 g/dL (ref 12.2–16.2)
Lymph, poc: 2.3 (ref 0.6–3.4)
MCH: 30.1 pg (ref 27–31.2)
MCHC: 31.8 g/dL (ref 31.8–35.4)
MCV: 94.5 fL (ref 80–97)
MPV: 9.4 fL (ref 0–99.8)
POC Granulocyte: 14.4 — AB (ref 2–6.9)
POC LYMPH PERCENT: 13.4 %L (ref 10–50)
Platelet Count, POC: 151 10*3/uL (ref 142–424)
RBC: 4.6 M/uL (ref 4.04–5.48)
RDW, POC: 12.6 %
WBC: 17.1 10*3/uL — AB (ref 4.6–10.2)

## 2013-05-12 MED ORDER — CIPROFLOXACIN HCL 500 MG PO TABS: 500.0000 mg | ORAL_TABLET | Freq: Two times a day (BID) | ORAL | Status: DC

## 2013-05-12 MED ORDER — METRONIDAZOLE 500 MG PO TABS: 500.0000 mg | ORAL_TABLET | Freq: Three times a day (TID) | ORAL | Status: DC

## 2013-05-12 NOTE — Patient Instructions (Signed)
Diverticulitis °A diverticulum is a small pouch or sac on the colon. Diverticulosis is the presence of these diverticula on the colon. Diverticulitis is the irritation (inflammation) or infection of diverticula. °CAUSES  °The colon and its diverticula contain bacteria. If food particles block the tiny opening to a diverticulum, the bacteria inside can grow and cause an increase in pressure. This leads to infection and inflammation and is called diverticulitis. °SYMPTOMS  °· Abdominal pain and tenderness. Usually, the pain is located on the left side of your abdomen. However, it could be located elsewhere. °· Fever. °· Bloating. °· Feeling sick to your stomach (nausea). °· Throwing up (vomiting). °· Abnormal stools. °DIAGNOSIS  °Your caregiver will take a history and perform a physical exam. Since many things can cause abdominal pain, other tests may be necessary. Tests may include: °· Blood tests. °· Urine tests. °· X-ray of the abdomen. °· CT scan of the abdomen. °Sometimes, surgery is needed to determine if diverticulitis or other conditions are causing your symptoms. °TREATMENT  °Most of the time, you can be treated without surgery. Treatment includes: °· Resting the bowels by only having liquids for a few days. As you improve, you will need to eat a low-fiber diet. °· Intravenous (IV) fluids if you are losing body fluids (dehydrated). °· Antibiotic medicines that treat infections may be given. °· Pain and nausea medicine, if needed. °· Surgery if the inflamed diverticulum has burst. °HOME CARE INSTRUCTIONS  °· Try a clear liquid diet (broth, tea, or water for as long as directed by your caregiver). You may then gradually begin a low-fiber diet as tolerated.  °A low-fiber diet is a diet with less than 10 grams of fiber. Choose the foods below to reduce fiber in the diet: °· White breads, cereals, rice, and pasta. °· Cooked fruits and vegetables or soft fresh fruits and vegetables without the skin. °· Ground or  well-cooked tender beef, ham, veal, lamb, pork, or poultry. °· Eggs and seafood. °· After your diverticulitis symptoms have improved, your caregiver may put you on a high-fiber diet. A high-fiber diet includes 14 grams of fiber for every 1000 calories consumed. For a standard 2000 calorie diet, you would need 28 grams of fiber. Follow these diet guidelines to help you increase the fiber in your diet. It is important to slowly increase the amount fiber in your diet to avoid gas, constipation, and bloating. °· Choose whole-grain breads, cereals, pasta, and brown rice. °· Choose fresh fruits and vegetables with the skin on. Do not overcook vegetables because the more vegetables are cooked, the more fiber is lost. °· Choose more nuts, seeds, legumes, dried peas, beans, and lentils. °· Look for food products that have greater than 3 grams of fiber per serving on the Nutrition Facts label. °· Take all medicine as directed by your caregiver. °· If your caregiver has given you a follow-up appointment, it is very important that you go. Not going could result in lasting (chronic) or permanent injury, pain, and disability. If there is any problem keeping the appointment, call to reschedule. °SEEK MEDICAL CARE IF:  °· Your pain does not improve. °· You have a hard time advancing your diet beyond clear liquids. °· Your bowel movements do not return to normal. °SEEK IMMEDIATE MEDICAL CARE IF:  °· Your pain becomes worse. °· You have an oral temperature above 102° F (38.9° C), not controlled by medicine. °· You have repeated vomiting. °· You have bloody or black, tarry stools. °·   Symptoms that brought you to your caregiver become worse or are not getting better. °MAKE SURE YOU:  °· Understand these instructions. °· Will watch your condition. °· Will get help right away if you are not doing well or get worse. °Document Released: 12/06/2004 Document Revised: 05/21/2011 Document Reviewed: 04/03/2010 °[employer]® Patient Information  ©2014 [employer]. ° °

## 2013-05-12 NOTE — Progress Notes (Signed)
Subjective:    Patient ID: [name], female    DOB: 03/23/1925, 80 y.o.   MRN: [id number]  HPI  Pt here today with left lower quadrant pain x4 days with significantly more pain today.  Pain is rated 10/10 and is cutting pain.  Made worse with movement.  Admits nausea, decreased appetite and fluid intake.  Denies fever, chills, body aches, constipation, diarrhea.  Took a Tylenol, but did not help with pain.  Review of Systems  Constitutional: Positive for appetite change. Negative for fever, chills, diaphoresis, activity change and fatigue.  Respiratory: Negative for shortness of breath.   Cardiovascular: Negative for chest pain.  Gastrointestinal: Positive for abdominal pain and abdominal distention. Negative for vomiting, diarrhea, constipation and blood in stool.  Genitourinary: Negative for dysuria and frequency.  Neurological: Negative for dizziness and light-headedness.  All other systems reviewed and are negative.       Objective:   Physical Exam  Constitutional: She is oriented to person, place, and time. She appears well-developed and well-nourished.  Cardiovascular: Normal rate, regular rhythm and normal heart sounds.   Pulmonary/Chest: Effort normal and breath sounds normal.  Abdominal: Soft. Bowel sounds are normal. She exhibits no mass. There is tenderness (left lower quadrant). There is no rebound and no guarding.  Neurological: She is alert and oriented to person, place, and time.  Skin: Skin is warm and dry.  Psychiatric: She has a normal mood and affect. Her behavior is normal. Judgment and thought content normal.   BP 142/72  Pulse 101  Temp(Src) 99 F (37.2 C) (Oral) Results for orders placed in visit on 05/12/13  POCT CBC      Result Value Ref Range   WBC 17.1 (*) 4.6 - 10.2 K/uL   Lymph, poc 2.3  0.6 - 3.4   POC LYMPH PERCENT 13.4  10 - 50 %L   MID (cbc)    0 - 0.9   POC MID %    0 - 12 %M   POC Granulocyte 14.4 (*) 2 - 6.9   Granulocyte  percent 84.5 (*) 37 - 80 %G   RBC 4.6  4.04 - 5.48 M/uL   Hemoglobin 13.7  12.2 - 16.2 g/dL   HCT, POC 43.0  37.7 - 47.9 %   MCV 94.5  80 - 97 fL   MCH, POC 30.1  27 - 31.2 pg   MCHC 31.8  31.8 - 35.4 g/dL   RDW, POC 12.6     Platelet Count, POC 151.0  142 - 424 K/uL   MPV 9.4  0 - 99.8 fL           Assessment & Plan:   1. Fever, unspecified   2. Diverticulitis of colon (without mention of hemorrhage)    Meds ordered this encounter  Medications  . metroNIDAZOLE (FLAGYL) 500 MG tablet    Sig: Take 1 tablet (500 mg total) by mouth 3 (three) times daily.    Dispense:  21 tablet    Refill:  0    Order Specific Question:  Supervising Provider    Answer:  Chipper Herb [[id number]]  . ciprofloxacin (CIPRO) 500 MG tablet    Sig: Take 1 tablet (500 mg total) by mouth 2 (two) times daily.    Dispense:  20 tablet    Refill:  0    Order Specific Question:  Supervising Provider    Answer:  Chipper Herb [[id number]]   Avoid things with small seeds, vegetables ,  nuts and popcorn Force fluids RTO prn  Mary-Margaret Hassell Done, FNP

## 2013-05-22 ENCOUNTER — Encounter: Payer: Self-pay | Admitting: Nurse Practitioner

## 2013-05-22 ENCOUNTER — Ambulatory Visit (INDEPENDENT_AMBULATORY_CARE_PROVIDER_SITE_OTHER): Payer: Medicare Other | Admitting: Nurse Practitioner

## 2013-05-22 VITALS — BP 144/74 | HR 80 | Temp 98.0°F | Wt 164.0 lb

## 2013-05-22 DIAGNOSIS — Z01419 Encounter for gynecological examination (general) (routine) without abnormal findings: Secondary | ICD-10-CM | POA: Diagnosis not present

## 2013-05-22 LAB — POCT URINALYSIS DIPSTICK
Bilirubin, UA: NEGATIVE
Glucose, UA: NEGATIVE
Ketones, UA: NEGATIVE
Leukocytes, UA: NEGATIVE
Nitrite, UA: NEGATIVE
Protein, UA: NEGATIVE
Spec Grav, UA: <=1.005
Urobilinogen, UA: NEGATIVE
pH, UA: 5

## 2013-05-22 NOTE — Patient Instructions (Signed)

## 2013-05-22 NOTE — Progress Notes (Signed)
   Subjective:    Patient ID: [name], female    DOB: December 18, 1925, 80 y.o.   MRN: [id number]  HPI  Patient is regular patient of Dr. Laurance Flatten- Was seen for follow up about 1 month ago- Here today for pelvic exam  only- she is doing well today without complaints.    Review of Systems  Constitutional: Negative.   HENT: Negative.   Respiratory: Negative.   Cardiovascular: Negative.   Gastrointestinal: Negative.   Genitourinary: Negative.   Neurological: Negative.   Psychiatric/Behavioral: Negative.   All other systems reviewed and are negative.       Objective:   Physical Exam  Constitutional: She is oriented to person, place, and time. She appears well-developed and well-nourished.  Cardiovascular: Normal rate, regular rhythm and normal heart sounds.   Pulmonary/Chest: Effort normal and breath sounds normal.  Abdominal: Soft. Bowel sounds are normal. She exhibits no distension and no mass. There is no tenderness. There is no rebound and no guarding.  Genitourinary: Vagina normal. Guaiac negative stool. No vaginal discharge found.  Vaginal cuff intact No adnexal mass or tenderness  Neurological: She is alert and oriented to person, place, and time.  Skin: Skin is warm and dry.  Psychiatric: She has a normal mood and affect. Her behavior is normal. Judgment and thought content normal.   BP 144/74  Pulse 80  Temp(Src) 98 F (36.7 C) (Oral)  Wt 164 lb (74.39 kg)        Assessment & Plan:   1. Visit for pelvic exam    Keep follow up appointments with Dr. Mayra Neer, FNP

## 2013-05-26 DIAGNOSIS — M25519 Pain in unspecified shoulder: Secondary | ICD-10-CM | POA: Diagnosis not present

## 2013-05-26 DIAGNOSIS — M25559 Pain in unspecified hip: Secondary | ICD-10-CM | POA: Diagnosis not present

## 2013-05-26 DIAGNOSIS — M48061 Spinal stenosis, lumbar region without neurogenic claudication: Secondary | ICD-10-CM | POA: Diagnosis not present

## 2013-05-28 DIAGNOSIS — H04129 Dry eye syndrome of unspecified lacrimal gland: Secondary | ICD-10-CM | POA: Diagnosis not present

## 2013-05-28 DIAGNOSIS — H353290 Exudative age-related macular degeneration, unspecified eye, stage unspecified: Secondary | ICD-10-CM | POA: Diagnosis not present

## 2013-05-28 DIAGNOSIS — Z961 Presence of intraocular lens: Secondary | ICD-10-CM | POA: Diagnosis not present

## 2013-05-28 DIAGNOSIS — E119 Type 2 diabetes mellitus without complications: Secondary | ICD-10-CM | POA: Diagnosis not present

## 2013-06-01 ENCOUNTER — Other Ambulatory Visit: Payer: Self-pay | Admitting: Family Medicine

## 2013-06-02 NOTE — Telephone Encounter (Signed)
This is okay to refill 

## 2013-06-02 NOTE — Telephone Encounter (Signed)
Last refill 05/06/13. Last ov 3/15. Route to pool to call in if approved.

## 2013-06-03 DIAGNOSIS — M48061 Spinal stenosis, lumbar region without neurogenic claudication: Secondary | ICD-10-CM | POA: Diagnosis not present

## 2013-06-03 DIAGNOSIS — IMO0002 Reserved for concepts with insufficient information to code with codable children: Secondary | ICD-10-CM | POA: Diagnosis not present

## 2013-06-10 ENCOUNTER — Other Ambulatory Visit: Payer: Self-pay | Admitting: Family Medicine

## 2013-06-16 ENCOUNTER — Encounter (INDEPENDENT_AMBULATORY_CARE_PROVIDER_SITE_OTHER): Payer: Self-pay | Admitting: Surgery

## 2013-06-19 ENCOUNTER — Other Ambulatory Visit: Payer: Self-pay

## 2013-06-19 DIAGNOSIS — Z1231 Encounter for screening mammogram for malignant neoplasm of breast: Secondary | ICD-10-CM

## 2013-06-29 DIAGNOSIS — M48061 Spinal stenosis, lumbar region without neurogenic claudication: Secondary | ICD-10-CM | POA: Diagnosis not present

## 2013-06-29 DIAGNOSIS — IMO0002 Reserved for concepts with insufficient information to code with codable children: Secondary | ICD-10-CM | POA: Diagnosis not present

## 2013-07-02 ENCOUNTER — Ambulatory Visit
Admission: RE | Admit: 2013-07-02 | Discharge: 2013-07-02 | Disposition: A | Discharge status: Home / Self Care | Payer: Medicare Other | Source: Ambulatory Visit

## 2013-07-02 DIAGNOSIS — Z1231 Encounter for screening mammogram for malignant neoplasm of breast: Secondary | ICD-10-CM | POA: Diagnosis not present

## 2013-07-08 DIAGNOSIS — M19019 Primary osteoarthritis, unspecified shoulder: Secondary | ICD-10-CM | POA: Diagnosis not present

## 2013-07-08 DIAGNOSIS — M719 Bursopathy, unspecified: Secondary | ICD-10-CM | POA: Diagnosis not present

## 2013-07-08 DIAGNOSIS — M67919 Unspecified disorder of synovium and tendon, unspecified shoulder: Secondary | ICD-10-CM | POA: Diagnosis not present

## 2013-07-10 DIAGNOSIS — M25519 Pain in unspecified shoulder: Secondary | ICD-10-CM | POA: Diagnosis not present

## 2013-07-13 ENCOUNTER — Encounter: Payer: Self-pay | Admitting: Family Medicine

## 2013-07-13 ENCOUNTER — Ambulatory Visit (INDEPENDENT_AMBULATORY_CARE_PROVIDER_SITE_OTHER): Payer: Medicare Other | Admitting: Family Medicine

## 2013-07-13 VITALS — BP 139/69 | HR 57 | Temp 97.3°F | Ht 65.0 in | Wt 157.0 lb

## 2013-07-13 DIAGNOSIS — E785 Hyperlipidemia, unspecified: Secondary | ICD-10-CM

## 2013-07-13 DIAGNOSIS — E559 Vitamin D deficiency, unspecified: Secondary | ICD-10-CM | POA: Diagnosis not present

## 2013-07-13 DIAGNOSIS — I10 Essential (primary) hypertension: Secondary | ICD-10-CM

## 2013-07-13 DIAGNOSIS — E039 Hypothyroidism, unspecified: Secondary | ICD-10-CM | POA: Diagnosis not present

## 2013-07-13 DIAGNOSIS — R7309 Other abnormal glucose: Secondary | ICD-10-CM

## 2013-07-13 DIAGNOSIS — E8881 Metabolic syndrome: Secondary | ICD-10-CM

## 2013-07-13 DIAGNOSIS — M25519 Pain in unspecified shoulder: Secondary | ICD-10-CM

## 2013-07-13 DIAGNOSIS — L2089 Other atopic dermatitis: Secondary | ICD-10-CM

## 2013-07-13 DIAGNOSIS — L209 Atopic dermatitis, unspecified: Secondary | ICD-10-CM

## 2013-07-13 DIAGNOSIS — M25512 Pain in left shoulder: Secondary | ICD-10-CM

## 2013-07-13 DIAGNOSIS — R739 Hyperglycemia, unspecified: Secondary | ICD-10-CM

## 2013-07-13 LAB — POCT CBC
Granulocyte percent: 60.4 %G (ref 37–80)
HCT, POC: 40.4 % (ref 37.7–47.9)
Hemoglobin: 12.7 g/dL (ref 12.2–16.2)
LYMPH, POC: 3.1 (ref 0.6–3.4)
MCH: 29.9 pg (ref 27–31.2)
MCHC: 31.5 g/dL — AB (ref 31.8–35.4)
MCV: 95.1 fL (ref 80–97)
MPV: 9.4 fL (ref 0–99.8)
PLATELET COUNT, POC: 147 10*3/uL (ref 142–424)
POC Granulocyte: 5.3 (ref 2–6.9)
POC LYMPH %: 36.1 % (ref 10–50)
RBC: 4.3 M/uL (ref 4.04–5.48)
RDW, POC: 13.7 %
WBC: 8.7 10*3/uL (ref 4.6–10.2)

## 2013-07-13 LAB — POCT GLYCOSYLATED HEMOGLOBIN (HGB A1C)

## 2013-07-13 MED ORDER — ATORVASTATIN CALCIUM 80 MG PO TABS: ORAL_TABLET | ORAL | Status: DC

## 2013-07-13 NOTE — Progress Notes (Signed)
Subjective:    Patient ID: [name], female    DOB: 10-Apr-1925, 80 y.o.   MRN: [id number]  HPI Pt here for follow up and management of chronic medical problems. The patient comes in today with her husband. He was seen in a separate exam room. She does complain of some back pain and left shoulder pain and has seen the orthopedist for this. She is considering surgery on her left shoulder because of spurs in the shoulder. She has also been dealing with the stress of her son having to light strokes. He is improved. She has also had a bout of diverticulitis from which she is improved. She is up-to-date on her health maintenance parameters. She will be getting lab work done today.         Patient Active Problem List   Diagnosis Date Noted  . Low back pain 03/17/2013  . Neuropathy 03/17/2013  . Metabolic syndrome 67/61/9509  . Overweight (BMI 25.0-29.9) 03/17/2013  . Vitamin D deficiency 12/01/2012  . Chronic insomnia 12/01/2012  . Fibrocystic breast changes 05/21/2011  . Hypertension   . Hyperlipidemia   . Peptic ulcer disease   . ITP (idiopathic thrombocytopenic purpura)   . Rhinitis   . Elevated blood sugar   . Hypothyroidism   . Goiter   . Colon polyps   . Adhesion of abdominal wall   . Esophageal stricture    Outpatient Encounter Prescriptions as of 07/13/2013  Medication Sig  . atorvastatin (LIPITOR) 80 MG tablet TAKE 1 TABLET ONCE A DAY  . B Complex-C-Folic Acid (MULTIVITAMIN, STRESS FORMULA) tablet Take 1 tablet by mouth daily.    . calcium citrate-vitamin D (CITRACAL+D) 315-200 MG-UNIT per tablet Take 1 tablet by mouth daily.  . Cholecalciferol (VITAMIN D3) 2000 UNITS TABS Take 1 tablet by mouth daily.    Marland Kitchen HYDROcodone-acetaminophen (NORCO) 7.5-325 MG per tablet   . levothyroxine (SYNTHROID, LEVOTHROID) 150 MCG tablet Take 150 mcg by mouth daily. 1 tablet daily except 1/2 on Sat & Sun  . valsartan-hydrochlorothiazide (DIOVAN-HCT) 320-25 MG per tablet TAKE 1 TABLET  DAILY  . VOLTAREN 1 % GEL   . zolpidem (AMBIEN) 10 MG tablet TAKE 1 TABLET AT BEDTIME AS NEEDED  . [DISCONTINUED] acetaminophen-codeine (TYLENOL #3) 300-30 MG per tablet   . [DISCONTINUED] metroNIDAZOLE (FLAGYL) 500 MG tablet Take 1 tablet (500 mg total) by mouth 3 (three) times daily.    Review of Systems  Constitutional: Negative.   HENT: Negative.   Eyes: Negative.   Respiratory: Negative.   Cardiovascular: Negative.   Gastrointestinal: Negative.   Endocrine: Negative.   Genitourinary: Negative.   Musculoskeletal: Positive for back pain (left shoulder pain- seen dr Louanne Skye and [facility]).  Skin: Negative.   Allergic/Immunologic: Negative.   Neurological: Negative.   Hematological: Negative.   Psychiatric/Behavioral: Negative.        Objective:   Physical Exam  Nursing note and vitals reviewed. Constitutional: She is oriented to person, place, and time. She appears well-developed and well-nourished. No distress.  HENT:  Head: Normocephalic and atraumatic.  Right Ear: External ear normal.  Left Ear: External ear normal.  Nose: Nose normal.  Mouth/Throat: Oropharynx is clear and moist.  Atopic dermatitis left ear pinna  Eyes: Conjunctivae and EOM are normal. Pupils are equal, round, and reactive to light. Right eye exhibits no discharge. Left eye exhibits no discharge. No scleral icterus.  Neck: Normal range of motion. Neck supple. No JVD present. No thyromegaly present.  Cardiovascular: Normal rate, regular rhythm,  normal heart sounds and intact distal pulses.  Exam reveals no gallop and no friction rub.   No murmur heard. At 72 per minute  Pulmonary/Chest: Effort normal and breath sounds normal. No respiratory distress. She has no wheezes. She has no rales. She exhibits no tenderness.  Abdominal: Soft. Bowel sounds are normal. She exhibits no mass. There is no tenderness. There is no rebound and no guarding.  Scars from previous surgery  Musculoskeletal: Normal range of  motion. She exhibits no edema and no tenderness.  Limited ability to abduct left shoulder secondary to pain in the shoulder. She is able to raise her shoulder using the other hand   Lymphadenopathy:    She has no cervical adenopathy.  Neurological: She is alert and oriented to person, place, and time. She has normal reflexes. No cranial nerve deficit.  Skin: Skin is warm and dry. No rash noted.  Psychiatric: She has a normal mood and affect. Her behavior is normal. Judgment and thought content normal.   BP 139/69  Pulse 57  Temp(Src) 97.3 F (36.3 C) (Oral)  Ht $R'5\' 5"'Sw$  (1.651 m)  Wt 157 lb (71.215 kg)  BMI 26.13 kg/m2        Assessment & Plan:  1. Hyperlipidemia - POCT CBC - Lipid panel  2. Hypertension - POCT CBC - BMP8+EGFR - Hepatic function panel  3. Hypothyroidism - POCT CBC - Thyroid Panel With TSH  4. Elevated blood sugar - POCT CBC - POCT glycosylated hemoglobin (Hb A1C)  5. Metabolic syndrome - POCT CBC - POCT glycosylated hemoglobin (Hb A1C)  6. Vitamin D deficiency - POCT CBC - Vit D  25 hydroxy (rtn osteoporosis monitoring)  7. Atopic dermatitis  8. Left shoulder pain -Followup with orthopedic  Patient Instructions                       Medicare Annual Wellness Visit  [facility] and the medical providers at [facility] strive to bring you the best medical care.  In doing so we not only want to address your current medical conditions and concerns but also to detect new conditions early and prevent illness, disease and health-related problems.    Medicare offers a yearly Wellness Visit which allows our clinical staff to assess your need for preventative services including immunizations, lifestyle education, counseling to decrease risk of preventable diseases and screening for fall risk and other medical concerns.    This visit is provided free of charge (no copay) for all Medicare recipients. The clinical pharmacists at [facility] have begun to conduct these Wellness Visits which will also include a thorough review of all your medications.    As you primary medical provider recommend that you make an appointment for your Annual Wellness Visit if you have not done so already this year.  You may set up this appointment before you leave today or you may call back ([phone]) and schedule an appointment.  Please make sure when you call that you mention that you are scheduling your Annual Wellness Visit with the clinical pharmacist so that the appointment may be made for the proper length of time.      Continue current medications. Continue good therapeutic lifestyle changes which include good diet and exercise. Fall precautions discussed with patient. If an FOBT was given today- please return it to our front desk. If you are over 59 years old - you may need Prevnar 34 or the adult  Pneumonia vaccine.  Continued followup with the orthopedist Use cortisone 10 sparingly to left ear for 7-10 days once or twice daily, medication is over-the-counter.   Arrie Senate MD

## 2013-07-13 NOTE — Patient Instructions (Addendum)
Medicare Annual Wellness Visit  [facility] [place] and the medical providers at [facility] strive to bring you the best medical care.  In doing so we not only want to address your current medical conditions and concerns but also to detect new conditions early and prevent illness, disease and health-related problems.    Medicare offers a yearly Wellness Visit which allows our clinical staff to assess your need for preventative services including immunizations, lifestyle education, counseling to decrease risk of preventable diseases and screening for fall risk and other medical concerns.    This visit is provided free of charge (no copay) for all Medicare recipients. The clinical pharmacists at [facility] have begun to conduct these Wellness Visits which will also include a thorough review of all your medications.    As you primary medical provider recommend that you make an appointment for your Annual Wellness Visit if you have not done so already this year.  You may set up this appointment before you leave today or you may call back [phone]) and schedule an appointment.  Please make sure when you call that you mention that you are scheduling your Annual Wellness Visit with the clinical pharmacist so that the appointment may be made for the proper length of time.      Continue current medications. Continue good therapeutic lifestyle changes which include good diet and exercise. Fall precautions discussed with patient. If an FOBT was given today- please return it to our front desk. If you are over 38 years old - you may need Prevnar 54 or the adult Pneumonia vaccine.  Continued followup with the orthopedist Use cortisone 10 sparingly to left ear for 7-10 days once or twice daily, medication is over-the-counter.

## 2013-07-14 LAB — HEPATIC FUNCTION PANEL
ALT: 9 IU/L (ref 0–32)
AST: 19 IU/L (ref 0–40)
Albumin: 4 g/dL (ref 3.5–4.7)
Alkaline Phosphatase: 75 IU/L (ref 39–117)
Bilirubin, Direct: 0.14 mg/dL (ref 0.00–0.40)
Total Bilirubin: 0.4 mg/dL (ref 0.0–1.2)
Total Protein: 6.6 g/dL (ref 6.0–8.5)

## 2013-07-14 LAB — BMP8+EGFR
BUN / CREAT RATIO: 21 (ref 11–26)
BUN: 19 mg/dL (ref 8–27)
CALCIUM: 9.7 mg/dL (ref 8.7–10.3)
CO2: 27 mmol/L (ref 18–29)
Chloride: 101 mmol/L (ref 97–108)
Creatinine, Ser: 0.91 mg/dL (ref 0.57–1.00)
GFR calc Af Amer: 67 mL/min/{1.73_m2} (ref >59)
GFR calc non Af Amer: 58 mL/min/{1.73_m2} — ABNORMAL LOW (ref >59)
Glucose: 118 mg/dL — ABNORMAL HIGH (ref 65–99)
POTASSIUM: 3.9 mmol/L (ref 3.5–5.2)
SODIUM: 143 mmol/L (ref 134–144)

## 2013-07-14 LAB — LIPID PANEL
CHOLESTEROL TOTAL: 130 mg/dL (ref 100–199)
Chol/HDL Ratio: 1.9 ratio units (ref 0.0–4.4)
HDL: 68 mg/dL (ref >39)
LDL Calculated: 46 mg/dL (ref 0–99)
Triglycerides: 80 mg/dL (ref 0–149)
VLDL CHOLESTEROL CAL: 16 mg/dL (ref 5–40)

## 2013-07-14 LAB — THYROID PANEL WITH TSH
Free Thyroxine Index: 2.5 (ref 1.2–4.9)
T3 UPTAKE RATIO: 33 % (ref 24–39)
T4, Total: 7.5 ug/dL (ref 4.5–12.0)
TSH: 1.89 u[IU]/mL (ref 0.450–4.500)

## 2013-07-14 LAB — VITAMIN D 25 HYDROXY (VIT D DEFICIENCY, FRACTURES): Vit D, 25-Hydroxy: 26.3 ng/mL — ABNORMAL LOW (ref 30.0–100.0)

## 2013-08-01 ENCOUNTER — Other Ambulatory Visit: Payer: Self-pay | Admitting: Family Medicine

## 2013-08-04 NOTE — Telephone Encounter (Signed)
This is okay to refill, please change directions to read one half to one nightly as needed

## 2013-08-04 NOTE — Telephone Encounter (Signed)
Last seen 07/13/13 DWM If approved route to nurse to call into [facility] 

## 2013-08-05 NOTE — Telephone Encounter (Signed)
Called in.

## 2013-08-11 ENCOUNTER — Ambulatory Visit (INDEPENDENT_AMBULATORY_CARE_PROVIDER_SITE_OTHER): Payer: Medicare Other | Admitting: Surgery

## 2013-08-11 ENCOUNTER — Encounter (INDEPENDENT_AMBULATORY_CARE_PROVIDER_SITE_OTHER): Payer: Self-pay | Admitting: Surgery

## 2013-08-11 VITALS — BP 138/64 | HR 72 | Temp 98.8°F | Resp 16 | Wt 146.6 lb

## 2013-08-11 DIAGNOSIS — N6019 Diffuse cystic mastopathy of unspecified breast: Secondary | ICD-10-CM

## 2013-08-11 NOTE — Progress Notes (Signed)
General Surgery [facility]  Chief Complaint  Patient presents with  . Breast Problem    history of fibrocystic change - annual visit    HISTORY: Patient is an 80 year old female followed for a history of fibrocystic disease of the breast. Patient underwent mammogram this year which shows no evidence of malignancy. She presents today for annual physical examination.  PERTINENT REVIEW OF SYSTEMS: Denies nipple discharge. Denies new masses. Denies tenderness.  EXAM: HEENT: normocephalic; pupils equal and reactive; sclerae clear; dentition good; mucous membranes moist NECK:  no palpable masses in the thyroid bed; well-healed cervical incision; symmetric on extension; no palpable anterior or posterior cervical lymphadenopathy; no supraclavicular masses; no tenderness BREAST: Right breast shows normal nipple areolar complex; old scar in the lateral breast well-healed; tissue is diffusely nodular without discrete or dominant mass; right axilla free of adenopathy. Left breast shows normal nipple areolar complex; breast tissue is diffusely nodular without discrete or dominant mass; left axilla is free of adenopathy CHEST: clear to auscultation bilaterally without rales, rhonchi, or wheezes CARDIAC: regular rate and rhythm without significant murmur; peripheral pulses are full EXT:  non-tender without edema; no deformity NEURO: no gross focal deficits; no sign of tremor   IMPRESSION: Personal history of fibrocystic change of the breast, no evidence of malignancy  PLAN: Patient will continue self examination. She will have mammography in one year. She will return in 1 year for physical examination.  Earnstine Regal, MD, [facility] Office: [phone]  Visit Diagnoses: 1. Fibrocystic breast changes

## 2013-08-31 ENCOUNTER — Other Ambulatory Visit (HOSPITAL_COMMUNITY): Payer: Self-pay | Admitting: Orthopaedic Surgery

## 2013-08-31 NOTE — Progress Notes (Signed)
Please enter orders in EPIC as patient has pre-op appointment at PST on 09/08/2013! Thank you!

## 2013-09-01 ENCOUNTER — Encounter (HOSPITAL_COMMUNITY): Payer: Self-pay | Admitting: Pharmacy Technician

## 2013-09-04 ENCOUNTER — Other Ambulatory Visit: Payer: Self-pay | Admitting: Family Medicine

## 2013-09-07 NOTE — Patient Instructions (Addendum)
[place]  09/08/2013   Your procedure is scheduled on:  7-10 -2015  Enter through [facility] Entrance and follow signs to [facility]. Arrive at   [employer].  Call this number if you have problems the morning of surgery: [phone]  Or Presurgical Testing [phone]([place]) For Living Will and/or Health Care Power Attorney Forms: please provide copy for your medical record,may bring AM of surgery(Forms should be already notarized -we do not provide this service).(Yes/ No information preferred today).     Do not eat food:After Midnight.  May have clear liquids:up to 6 Hours before arrival. Nothing after : 0800 AM.  Clear liquids include soda, tea, black coffee, apple or grape juice, broth.  Take these medicines the morning of surgery with A SIP OF WATER: Synthroid(no substitution). Tylenol. Hydrocodone(if needed)   Do not wear jewelry, make-up or nail polish.  Do not wear lotions, powders, or perfumes. You may wear deodorant.  Do not shave 48 hours(2 days) prior to first CHG shower(legs and under arms).(Shaving face and neck okay.)  Do not bring valuables to the hospital.(Hospital is not responsible for lost valuables).  Contacts, dentures or removable bridgework, body piercing, hair pins may not be worn into surgery.  Leave suitcase in the car. After surgery it may be brought to your room.  For patients admitted to the hospital, checkout time is 11:00 AM the day of discharge.(Restricted visitors-Any Persons displaying flu-like symptoms or illness).    Patients discharged the day of surgery will not be allowed to drive home. Must have responsible person with you x 24 hours once discharged.  Name and phone number of your driver: Herma Carson, granddaughter( will Provide phone # AM of.)  Special Instructions: CHG(Chlorhedine 4%-"Hibiclens","Betasept","Aplicare") Shower Use Special Wash: see special instructions.(avoid face and  genitals)      __________________    [facility] - Preparing for Surgery Before surgery, you can play an important role.  Because skin is not sterile, your skin needs to be as free of germs as possible.  You can reduce the number of germs on your skin by washing with CHG (chlorahexidine gluconate) soap before surgery.  CHG is an antiseptic cleaner which kills germs and bonds with the skin to continue killing germs even after washing. Please DO NOT use if you have an allergy to CHG or antibacterial soaps.  If your skin becomes reddened/irritated stop using the CHG and inform your nurse when you arrive at Short Stay. Do not shave (including legs and underarms) for at least 48 hours prior to the first CHG shower.  You may shave your face/neck. Please follow these instructions carefully:  1.  Shower with CHG Soap the night before surgery and the  morning of Surgery.  2.  If you choose to wash your hair, wash your hair first as usual with your  normal  shampoo.  3.  After you shampoo, rinse your hair and body thoroughly to remove the  shampoo.                           4.  Use CHG as you would any other liquid soap.  You can apply chg directly  to the skin and wash                       Gently with a scrungie or clean washcloth.  5.  Apply the CHG Soap to your  body ONLY FROM THE NECK DOWN.   Do not use on face/ open                           Wound or open sores. Avoid contact with eyes, ears mouth and genitals (private parts).                       Wash face,  Genitals (private parts) with your normal soap.             6.  Wash thoroughly, paying special attention to the area where your surgery  will be performed.  7.  Thoroughly rinse your body with warm water from the neck down.  8.  DO NOT shower/wash with your normal soap after using and rinsing off  the CHG Soap.                9.  Pat yourself dry with a clean towel.            10.  Wear clean pajamas.            11.  Place clean sheets on  your bed the night of your first shower and do not  sleep with pets. Day of Surgery : Do not apply any lotions/deodorants the morning of surgery.  Please wear clean clothes to the hospital/surgery center.  FAILURE TO FOLLOW THESE INSTRUCTIONS MAY RESULT IN THE CANCELLATION OF YOUR SURGERY PATIENT SIGNATURE_________________________________  NURSE SIGNATURE__________________________________  ________________________________________________________________________

## 2013-09-08 ENCOUNTER — Encounter (HOSPITAL_COMMUNITY)
Admission: RE | Admit: 2013-09-08 | Discharge: 2013-09-08 | Disposition: A | Discharge status: Home / Self Care | Payer: Medicare Other | Source: Ambulatory Visit | Attending: Orthopaedic Surgery | Admitting: Orthopaedic Surgery

## 2013-09-08 ENCOUNTER — Other Ambulatory Visit: Payer: Self-pay

## 2013-09-08 ENCOUNTER — Encounter (HOSPITAL_COMMUNITY): Payer: Self-pay

## 2013-09-08 ENCOUNTER — Ambulatory Visit (HOSPITAL_COMMUNITY)
Admission: RE | Admit: 2013-09-08 | Discharge: 2013-09-08 | Disposition: A | Discharge status: Home / Self Care | Payer: Medicare Other | Source: Ambulatory Visit | Attending: Anesthesiology | Admitting: Anesthesiology

## 2013-09-08 DIAGNOSIS — Z01810 Encounter for preprocedural cardiovascular examination: Secondary | ICD-10-CM | POA: Diagnosis not present

## 2013-09-08 DIAGNOSIS — Z01812 Encounter for preprocedural laboratory examination: Secondary | ICD-10-CM | POA: Diagnosis not present

## 2013-09-08 DIAGNOSIS — Z01818 Encounter for other preprocedural examination: Secondary | ICD-10-CM | POA: Diagnosis not present

## 2013-09-08 LAB — CBC
HCT: 43.8 % (ref 36.0–46.0)
HEMOGLOBIN: 13.8 g/dL (ref 12.0–15.0)
MCH: 31.2 pg (ref 26.0–34.0)
MCHC: 31.5 g/dL (ref 30.0–36.0)
MCV: 99.1 fL (ref 78.0–100.0)
Platelets: 182 10*3/uL (ref 150–400)
RBC: 4.42 MIL/uL (ref 3.87–5.11)
RDW: 12.9 % (ref 11.5–15.5)
WBC: 8.7 10*3/uL (ref 4.0–10.5)

## 2013-09-08 LAB — BASIC METABOLIC PANEL
BUN: 9 mg/dL (ref 6–23)
CALCIUM: 9.9 mg/dL (ref 8.4–10.5)
CO2: 27 mEq/L (ref 19–32)
CREATININE: 0.77 mg/dL (ref 0.50–1.10)
Chloride: 100 mEq/L (ref 96–112)
GFR calc Af Amer: 86 mL/min — ABNORMAL LOW (ref >90)
GFR, EST NON AFRICAN AMERICAN: 74 mL/min — AB (ref >90)
Glucose, Bld: 115 mg/dL — ABNORMAL HIGH (ref 70–99)
Potassium: 4.3 mEq/L (ref 3.7–5.3)
SODIUM: 141 meq/L (ref 137–147)

## 2013-09-08 NOTE — Pre-Procedure Instructions (Signed)
09-08-13 CXR abnormal results-viewable in Epic.

## 2013-09-08 NOTE — Pre-Procedure Instructions (Signed)
09-08-13 EKG/XR done today.

## 2013-09-18 ENCOUNTER — Observation Stay (HOSPITAL_COMMUNITY)
Admission: RE | Admit: 2013-09-18 | Discharge: 2013-09-19 | Disposition: A | Discharge status: Home / Self Care | Payer: Medicare Other | Source: Ambulatory Visit | Attending: Orthopaedic Surgery | Admitting: Orthopaedic Surgery

## 2013-09-18 ENCOUNTER — Encounter (HOSPITAL_COMMUNITY): Payer: Self-pay | Admitting: *Deleted

## 2013-09-18 ENCOUNTER — Encounter (HOSPITAL_COMMUNITY): Payer: Medicare Other | Admitting: Anesthesiology

## 2013-09-18 ENCOUNTER — Ambulatory Visit (HOSPITAL_COMMUNITY): Payer: Medicare Other | Admitting: Anesthesiology

## 2013-09-18 ENCOUNTER — Encounter (HOSPITAL_COMMUNITY)
Admission: RE | Disposition: A | Discharge status: Home / Self Care | Payer: Self-pay | Source: Ambulatory Visit | Attending: Orthopaedic Surgery

## 2013-09-18 DIAGNOSIS — Z90710 Acquired absence of both cervix and uterus: Secondary | ICD-10-CM | POA: Insufficient documentation

## 2013-09-18 DIAGNOSIS — E785 Hyperlipidemia, unspecified: Secondary | ICD-10-CM | POA: Insufficient documentation

## 2013-09-18 DIAGNOSIS — M25819 Other specified joint disorders, unspecified shoulder: Secondary | ICD-10-CM | POA: Diagnosis not present

## 2013-09-18 DIAGNOSIS — F17200 Nicotine dependence, unspecified, uncomplicated: Secondary | ICD-10-CM | POA: Insufficient documentation

## 2013-09-18 DIAGNOSIS — M67919 Unspecified disorder of synovium and tendon, unspecified shoulder: Secondary | ICD-10-CM | POA: Diagnosis not present

## 2013-09-18 DIAGNOSIS — E039 Hypothyroidism, unspecified: Secondary | ICD-10-CM | POA: Diagnosis not present

## 2013-09-18 DIAGNOSIS — M7542 Impingement syndrome of left shoulder: Secondary | ICD-10-CM

## 2013-09-18 DIAGNOSIS — Z79899 Other long term (current) drug therapy: Secondary | ICD-10-CM | POA: Insufficient documentation

## 2013-09-18 DIAGNOSIS — M25519 Pain in unspecified shoulder: Secondary | ICD-10-CM | POA: Diagnosis not present

## 2013-09-18 DIAGNOSIS — M7580 Other shoulder lesions, unspecified shoulder: Secondary | ICD-10-CM

## 2013-09-18 DIAGNOSIS — K279 Peptic ulcer, site unspecified, unspecified as acute or chronic, without hemorrhage or perforation: Secondary | ICD-10-CM | POA: Insufficient documentation

## 2013-09-18 DIAGNOSIS — M719 Bursopathy, unspecified: Principal | ICD-10-CM | POA: Insufficient documentation

## 2013-09-18 DIAGNOSIS — G8918 Other acute postprocedural pain: Secondary | ICD-10-CM | POA: Diagnosis not present

## 2013-09-18 DIAGNOSIS — M19019 Primary osteoarthritis, unspecified shoulder: Secondary | ICD-10-CM | POA: Insufficient documentation

## 2013-09-18 DIAGNOSIS — I10 Essential (primary) hypertension: Secondary | ICD-10-CM | POA: Insufficient documentation

## 2013-09-18 DIAGNOSIS — Z9889 Other specified postprocedural states: Secondary | ICD-10-CM

## 2013-09-18 SURGERY — SHOULDER ARTHROSCOPY WITH SUBACROMIAL DECOMPRESSION AND DISTAL CLAVICLE EXCISION
Anesthesia: General | Site: Shoulder | Laterality: Left

## 2013-09-18 MED ORDER — HYDROMORPHONE HCL PF 1 MG/ML IJ SOLN
0.2500 mg | INTRAMUSCULAR | Status: DC | PRN
  Administered 2013-09-18 (×2): 0.5 mg via INTRAVENOUS
  Administered 2013-09-18 (×2): 0.25 mg via INTRAVENOUS
  Administered 2013-09-18: 0.5 mg via INTRAVENOUS

## 2013-09-18 MED ORDER — GLYCOPYRROLATE 0.2 MG/ML IJ SOLN
INTRAMUSCULAR | Status: AC
  Filled 2013-09-18: qty 3

## 2013-09-18 MED ORDER — HYDROMORPHONE HCL PF 1 MG/ML IJ SOLN
INTRAMUSCULAR | Status: AC
  Filled 2013-09-18: qty 1

## 2013-09-18 MED ORDER — ZOLPIDEM TARTRATE 5 MG PO TABS: 5.0000 mg | ORAL_TABLET | Freq: Every evening | ORAL | Status: DC | PRN

## 2013-09-18 MED ORDER — GLYCOPYRROLATE 0.2 MG/ML IJ SOLN
INTRAMUSCULAR | Status: DC | PRN
  Administered 2013-09-18: .5 mg via INTRAVENOUS

## 2013-09-18 MED ORDER — ONDANSETRON HCL 4 MG PO TABS: 4.0000 mg | ORAL_TABLET | Freq: Four times a day (QID) | ORAL | Status: DC | PRN

## 2013-09-18 MED ORDER — VALSARTAN-HYDROCHLOROTHIAZIDE 320-12.5 MG PO TABS: 1.0000 | ORAL_TABLET | Freq: Every evening | ORAL | Status: DC

## 2013-09-18 MED ORDER — LACTATED RINGERS IV SOLN
INTRAVENOUS | Status: DC
  Administered 2013-09-18: 15:00:00 via INTRAVENOUS
  Administered 2013-09-18: 1000 mL via INTRAVENOUS

## 2013-09-18 MED ORDER — PROPOFOL 10 MG/ML IV BOLUS
INTRAVENOUS | Status: DC | PRN
  Administered 2013-09-18: 120 mg via INTRAVENOUS

## 2013-09-18 MED ORDER — PROPOFOL 10 MG/ML IV BOLUS
INTRAVENOUS | Status: AC
  Filled 2013-09-18: qty 20

## 2013-09-18 MED ORDER — HYDROCODONE-ACETAMINOPHEN 7.5-325 MG PO TABS: 1.0000 | ORAL_TABLET | ORAL | Status: DC | PRN

## 2013-09-18 MED ORDER — NEOSTIGMINE METHYLSULFATE 10 MG/10ML IV SOLN
INTRAVENOUS | Status: AC
  Filled 2013-09-18: qty 1

## 2013-09-18 MED ORDER — METOCLOPRAMIDE HCL 10 MG PO TABS: 5.0000 mg | ORAL_TABLET | Freq: Three times a day (TID) | ORAL | Status: DC | PRN

## 2013-09-18 MED ORDER — OXYCODONE HCL 5 MG PO TABS
5.0000 mg | ORAL_TABLET | ORAL | Status: DC | PRN
  Administered 2013-09-18: 5 mg via ORAL
  Administered 2013-09-19 (×3): 10 mg via ORAL
  Filled 2013-09-18: qty 1
  Filled 2013-09-18 (×3): qty 2

## 2013-09-18 MED ORDER — LEVOTHYROXINE SODIUM 150 MCG PO TABS
150.0000 ug | ORAL_TABLET | Freq: Every day | ORAL | Status: DC
  Administered 2013-09-19: 150 ug via ORAL
  Filled 2013-09-18 (×2): qty 1

## 2013-09-18 MED ORDER — FENTANYL CITRATE 0.05 MG/ML IJ SOLN
INTRAMUSCULAR | Status: DC | PRN
  Administered 2013-09-18: 50 ug via INTRAVENOUS
  Administered 2013-09-18: 25 ug via INTRAVENOUS
  Administered 2013-09-18: 50 ug via INTRAVENOUS
  Administered 2013-09-18: 25 ug via INTRAVENOUS

## 2013-09-18 MED ORDER — PHENYLEPHRINE 40 MCG/ML (10ML) SYRINGE FOR IV PUSH (FOR BLOOD PRESSURE SUPPORT)
PREFILLED_SYRINGE | INTRAVENOUS | Status: AC
  Filled 2013-09-18: qty 10

## 2013-09-18 MED ORDER — ONDANSETRON HCL 4 MG/2ML IJ SOLN: 4.0000 mg | Freq: Four times a day (QID) | INTRAMUSCULAR | Status: DC | PRN

## 2013-09-18 MED ORDER — CEFAZOLIN SODIUM 1-5 GM-% IV SOLN
1.0000 g | Freq: Four times a day (QID) | INTRAVENOUS | Status: AC
  Administered 2013-09-18 – 2013-09-19 (×3): 1 g via INTRAVENOUS
  Filled 2013-09-18 (×3): qty 50

## 2013-09-18 MED ORDER — HYDROCHLOROTHIAZIDE 25 MG PO TABS
25.0000 mg | ORAL_TABLET | Freq: Every day | ORAL | Status: DC
  Administered 2013-09-19: 25 mg via ORAL
  Filled 2013-09-18: qty 1

## 2013-09-18 MED ORDER — IRBESARTAN 300 MG PO TABS
300.0000 mg | ORAL_TABLET | Freq: Every day | ORAL | Status: DC
  Administered 2013-09-19: 300 mg via ORAL
  Filled 2013-09-18: qty 1

## 2013-09-18 MED ORDER — PHENYLEPHRINE HCL 10 MG/ML IJ SOLN
INTRAMUSCULAR | Status: AC
  Filled 2013-09-18: qty 1

## 2013-09-18 MED ORDER — LIDOCAINE HCL (CARDIAC) 20 MG/ML IV SOLN
INTRAVENOUS | Status: DC | PRN
  Administered 2013-09-18: 100 mg via INTRAVENOUS

## 2013-09-18 MED ORDER — HYDROCHLOROTHIAZIDE 25 MG PO TABS
25.0000 mg | ORAL_TABLET | Freq: Every day | ORAL | Status: DC
  Filled 2013-09-18: qty 1

## 2013-09-18 MED ORDER — ONDANSETRON HCL 4 MG/2ML IJ SOLN
INTRAMUSCULAR | Status: AC
  Filled 2013-09-18: qty 2

## 2013-09-18 MED ORDER — METOCLOPRAMIDE HCL 5 MG/ML IJ SOLN: 5.0000 mg | Freq: Three times a day (TID) | INTRAMUSCULAR | Status: DC | PRN

## 2013-09-18 MED ORDER — SODIUM CHLORIDE 0.9 % IV SOLN
INTRAVENOUS | Status: DC
  Administered 2013-09-18: 18:00:00 via INTRAVENOUS

## 2013-09-18 MED ORDER — FENTANYL CITRATE 0.05 MG/ML IJ SOLN
INTRAMUSCULAR | Status: AC
  Filled 2013-09-18: qty 5

## 2013-09-18 MED ORDER — PHENYLEPHRINE HCL 10 MG/ML IJ SOLN
INTRAMUSCULAR | Status: DC | PRN
  Administered 2013-09-18 (×2): 80 ug via INTRAVENOUS

## 2013-09-18 MED ORDER — VALSARTAN-HYDROCHLOROTHIAZIDE 320-25 MG PO TABS: 1.0000 | ORAL_TABLET | Freq: Every day | ORAL | Status: DC

## 2013-09-18 MED ORDER — LACTATED RINGERS IV SOLN: INTRAVENOUS | Status: DC

## 2013-09-18 MED ORDER — METHOCARBAMOL 500 MG PO TABS
500.0000 mg | ORAL_TABLET | Freq: Four times a day (QID) | ORAL | Status: DC | PRN
  Administered 2013-09-18 – 2013-09-19 (×2): 500 mg via ORAL
  Filled 2013-09-18 (×2): qty 1

## 2013-09-18 MED ORDER — METHOCARBAMOL 1000 MG/10ML IJ SOLN
500.0000 mg | Freq: Four times a day (QID) | INTRAMUSCULAR | Status: DC | PRN
  Administered 2013-09-18: 500 mg via INTRAVENOUS
  Filled 2013-09-18: qty 5

## 2013-09-18 MED ORDER — DIPHENHYDRAMINE HCL 12.5 MG/5ML PO ELIX: 12.5000 mg | ORAL_SOLUTION | ORAL | Status: DC | PRN

## 2013-09-18 MED ORDER — IRBESARTAN 300 MG PO TABS
300.0000 mg | ORAL_TABLET | Freq: Two times a day (BID) | ORAL | Status: DC
  Filled 2013-09-18: qty 1

## 2013-09-18 MED ORDER — PHENYLEPHRINE HCL 10 MG/ML IJ SOLN
10.0000 mg | INTRAVENOUS | Status: DC | PRN
  Administered 2013-09-18: 50 ug/min via INTRAVENOUS

## 2013-09-18 MED ORDER — MIDAZOLAM HCL 2 MG/2ML IJ SOLN
0.5000 mg | Freq: Once | INTRAMUSCULAR | Status: AC
  Administered 2013-09-18: 0.5 mg via INTRAVENOUS

## 2013-09-18 MED ORDER — CEFAZOLIN SODIUM-DEXTROSE 2-3 GM-% IV SOLR
2.0000 g | INTRAVENOUS | Status: AC
  Administered 2013-09-18: 2 g via INTRAVENOUS

## 2013-09-18 MED ORDER — MORPHINE SULFATE 2 MG/ML IJ SOLN: 1.0000 mg | INTRAMUSCULAR | Status: DC | PRN

## 2013-09-18 MED ORDER — HYDROCHLOROTHIAZIDE 12.5 MG PO CAPS
12.5000 mg | ORAL_CAPSULE | Freq: Every day | ORAL | Status: DC
  Filled 2013-09-18: qty 1

## 2013-09-18 MED ORDER — SUCCINYLCHOLINE CHLORIDE 20 MG/ML IJ SOLN
INTRAMUSCULAR | Status: DC | PRN
  Administered 2013-09-18: 100 mg via INTRAVENOUS

## 2013-09-18 MED ORDER — MIDAZOLAM HCL 2 MG/2ML IJ SOLN
INTRAMUSCULAR | Status: AC
  Filled 2013-09-18: qty 2

## 2013-09-18 MED ORDER — NEOSTIGMINE METHYLSULFATE 10 MG/10ML IV SOLN
INTRAVENOUS | Status: DC | PRN
  Administered 2013-09-18: 4 mg via INTRAVENOUS

## 2013-09-18 MED ORDER — ROPIVACAINE HCL 5 MG/ML IJ SOLN
INTRAMUSCULAR | Status: DC | PRN
  Administered 2013-09-18: 20 mL via PERINEURAL

## 2013-09-18 MED ORDER — CEFAZOLIN SODIUM-DEXTROSE 2-3 GM-% IV SOLR
INTRAVENOUS | Status: AC
  Filled 2013-09-18: qty 50

## 2013-09-18 MED ORDER — HYDROCODONE-ACETAMINOPHEN 5-325 MG PO TABS: 1.0000 | ORAL_TABLET | ORAL | Status: DC | PRN

## 2013-09-18 MED ORDER — ONDANSETRON HCL 4 MG/2ML IJ SOLN
INTRAMUSCULAR | Status: DC | PRN
  Administered 2013-09-18: 4 mg via INTRAVENOUS

## 2013-09-18 MED ORDER — ROPIVACAINE HCL 5 MG/ML IJ SOLN
INTRAMUSCULAR | Status: AC
  Filled 2013-09-18: qty 30

## 2013-09-18 MED ORDER — ROCURONIUM BROMIDE 100 MG/10ML IV SOLN
INTRAVENOUS | Status: DC | PRN
  Administered 2013-09-18: 30 mg via INTRAVENOUS

## 2013-09-18 MED ORDER — ZOLPIDEM TARTRATE 5 MG PO TABS
5.0000 mg | ORAL_TABLET | Freq: Every day | ORAL | Status: DC
  Administered 2013-09-18: 5 mg via ORAL
  Filled 2013-09-18: qty 1

## 2013-09-18 MED ORDER — PROMETHAZINE HCL 25 MG/ML IJ SOLN: 6.2500 mg | INTRAMUSCULAR | Status: DC | PRN

## 2013-09-18 SURGICAL SUPPLY — 48 items
ANCH SUT PUSHLCK 19.5X3.5 STRL (Anchor) ×2 IMPLANT
ANCH SUT PUSHLCK 24X4.5 STRL (Orthopedic Implant) ×4 IMPLANT
ANCHOR PUSHLOCK PEEK 3.5X19.5 (Anchor) ×6 IMPLANT
BAG SPEC THK2 15X12 ZIP CLS (MISCELLANEOUS) ×1
BAG ZIPLOCK 12X15 (MISCELLANEOUS) ×3 IMPLANT
BLADE 4.2CUDA (BLADE) ×3 IMPLANT
BLADE SURG SZ11 CARB STEEL (BLADE) ×3 IMPLANT
BOOTIES KNEE HIGH SLOAN (MISCELLANEOUS) ×6 IMPLANT
BUR VERTEX HOODED 4.5 (BURR) ×2 IMPLANT
CANNULA 5.75X7 CRYSTAL CLEAR (CANNULA) ×3 IMPLANT
CANNULA SHOULDER 7CM (CANNULA) ×6 IMPLANT
COVER SURGICAL LIGHT HANDLE (MISCELLANEOUS) ×3 IMPLANT
DECANTER SPIKE VIAL GLASS SM (MISCELLANEOUS) ×1 IMPLANT
DRAPE SHOULDER BEACH CHAIR (DRAPES) ×3 IMPLANT
DRAPE U-SHAPE 47X51 STRL (DRAPES) ×3 IMPLANT
ELECT REM PT RETURN 9FT ADLT (ELECTROSURGICAL)
ELECTRODE REM PT RTRN 9FT ADLT (ELECTROSURGICAL) ×1 IMPLANT
GAUZE XEROFORM 1X8 LF (GAUZE/BANDAGES/DRESSINGS) ×2 IMPLANT
GLOVE BIO SURGEON STRL SZ7.5 (GLOVE) ×3 IMPLANT
GLOVE BIOGEL PI IND STRL 8 (GLOVE) ×1 IMPLANT
GLOVE BIOGEL PI INDICATOR 8 (GLOVE) ×2
GLOVE ECLIPSE 8.0 STRL XLNG CF (GLOVE) ×3 IMPLANT
GLOVE ORTHO TXT STRL SZ7.5 (GLOVE) ×3 IMPLANT
GOWN STRL REUS W/TWL XL LVL3 (GOWN DISPOSABLE) ×7 IMPLANT
IV LACTATED RINGER IRRG 3000ML (IV SOLUTION) ×12
IV LR IRRIG 3000ML ARTHROMATIC (IV SOLUTION) ×6 IMPLANT
KIT BASIN OR (CUSTOM PROCEDURE TRAY) ×3 IMPLANT
KIT SHOULDER TRACTION (DRAPES) ×3 IMPLANT
MANIFOLD NEPTUNE II (INSTRUMENTS) ×3 IMPLANT
NDL SCORPION (NEEDLE) IMPLANT
NDL SPNL 18GX3.5 QUINCKE PK (NEEDLE) ×1 IMPLANT
NEEDLE HYPO 22GX1.5 SAFETY (NEEDLE) ×3 IMPLANT
NEEDLE SCORPION (NEEDLE) IMPLANT
NEEDLE SPNL 18GX3.5 QUINCKE PK (NEEDLE) ×3 IMPLANT
PACK SHOULDER CUSTOM OPM052 (CUSTOM PROCEDURE TRAY) ×3 IMPLANT
PAD ABD 8X10 STRL (GAUZE/BANDAGES/DRESSINGS) ×2 IMPLANT
POSITIONER SURGICAL ARM (MISCELLANEOUS) ×6 IMPLANT
PUSHLOCK PEEK 4.5X24 (Orthopedic Implant) ×12 IMPLANT
SET ARTHROSCOPY TUBING (MISCELLANEOUS) ×3
SET ARTHROSCOPY TUBING LN (MISCELLANEOUS) ×1 IMPLANT
SLING ARM IMMOBILIZER LRG (SOFTGOODS) IMPLANT
SLING ARM IMMOBILIZER MED (SOFTGOODS) IMPLANT
SLING ARM MED ADULT FOAM STRAP (SOFTGOODS) ×2 IMPLANT
SPONGE GAUZE 4X4 12PLY (GAUZE/BANDAGES/DRESSINGS) ×2 IMPLANT
SUT ETHILON 3 0 PS 1 (SUTURE) ×3 IMPLANT
TAPE CLOTH SURG 6X10 WHT LF (GAUZE/BANDAGES/DRESSINGS) ×2 IMPLANT
TOWEL OR 17X26 10 PK STRL BLUE (TOWEL DISPOSABLE) ×6 IMPLANT
WAND 90 DEG TURBOVAC W/CORD (SURGICAL WAND) ×3 IMPLANT

## 2013-09-18 NOTE — Brief Op Note (Signed)
09/18/2013  4:59 PM  PATIENT:  [name]  80 y.o. female  PRE-OPERATIVE DIAGNOSIS:  Left shoulder impingement with acromioclavicular joint arthritis  POST-OPERATIVE DIAGNOSIS:  Left shoulder impingement with acromicoclavicular joint arthritis  PROCEDURE:  Procedure(s): LEFT SHOULDER ARTHROSCOPY WITH DEBRIDEMENT, SUBACROMIAL DECOMPRESSION AND DISTAL CLAVICLE RESECTION (Left)  SURGEON:  Surgeon(s) and Role:    * Mcarthur Rossetti, MD - Primary  PHYSICIAN ASSISTANT: Benita Stabile, PA-C  ANESTHESIA:   regional and general  EBL:  Total I/O In: 1000 [I.V.:1000] Out: -   BLOOD ADMINISTERED:none  DRAINS: none   LOCAL MEDICATIONS USED:  NONE  SPECIMEN:  No Specimen  DISPOSITION OF SPECIMEN:  N/A  COUNTS:  YES  TOURNIQUET:  * No tourniquets in log *  DICTATION: .Other Dictation: Dictation Number [id number]  PLAN OF CARE: Admit to inpatient   PATIENT DISPOSITION:  PACU - hemodynamically stable.   Delay start of Pharmacological VTE agent (>24hrs) due to surgical blood loss or risk of bleeding: no

## 2013-09-18 NOTE — Discharge Instructions (Signed)
Increase your activities as comfort allows. Come in and out of your sling as your pain lessens and you become more comfortable. You can remove all of your dressings 09/20/13 and start getting your incisions wet daily in the shower. Place daily band-aids over your incisions starting 09/20/13

## 2013-09-18 NOTE — H&P (Signed)
[name] is an 80 y.o. female.   Chief Complaint:   Left shoulder pain HPI:   80 yo female with worsening left shoulder pain and function.  Has failed multiple injections, NSAIDs, pain meds, therapy, rest, ice/heat, and time.  Wishes for surgery at this point due to her daily pain and discomfort as well as the detrimental effect this had had on her quality of life.  She understands fully the risks and benefits involved given her age and co-morbidities.  Past Medical History  Diagnosis Date  . Breast cyst   . Unspecified essential hypertension   . Other and unspecified hyperlipidemia   . Peptic ulcer disease   . ITP (idiopathic thrombocytopenic purpura)     not a problem now.  . Rhinitis   . Elevated blood sugar   . Unspecified hypothyroidism   . Goiter   . Colon polyps   . Adhesion of abdominal wall     "continues to have abdominal pain pain intermittent right abdominal side. "scar tissue"  . Esophageal stricture     "occ. problems with swallowing water"    Past Surgical History  Procedure Laterality Date  . Abdominal hysterectomy    . Cataract surgery Bilateral   . Thyroid surgery      Total "goiter removal"  . 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 Breast surgery      x3 biopsy  . Back surgery      x1 Lumbar, x2 cervical anterior and posterior .  Marland Kitchen Hemorrhoid surgery      Family History  Problem Relation Age of Onset  . Lung cancer Father   . Heart disease Mother   . Emphysema Sister    Social History:  reports that she has been passively smoking.  She started smoking about 68 years ago. She does not have any smokeless tobacco history on file. She reports that she does not drink alcohol or use illicit drugs.  Allergies:  Allergies  Allergen Reactions  . Hydrocodone Other (See Comments)    Felt like "I didn't like myself or nobody" 09-08-13 States "can take in low doses"  . Prednisone Other (See Comments)    bloated    Medications  Prior to Admission  Medication Sig Dispense Refill  . acetaminophen (TYLENOL) 325 MG tablet Take 650 mg by mouth every 6 (six) hours as needed (Pain).      Marland Kitchen acetaminophen-codeine (TYLENOL #3) 300-30 MG per tablet Take 1 tablet by mouth every 4 (four) hours as needed for moderate pain.      Marland Kitchen aluminum & magnesium hydroxide-simethicone (MYLANTA) 500-450-40 MG/5ML suspension Take 15 mLs by mouth every 6 (six) hours as needed for indigestion.      Marland Kitchen atorvastatin (LIPITOR) 80 MG tablet Take 80 mg by mouth daily. Takes in PM      . calcium citrate-vitamin D (CITRACAL+D) 315-200 MG-UNIT per tablet Take 1 tablet by mouth daily.      . Cholecalciferol (VITAMIN D3) 2000 UNITS TABS Take 1 tablet by mouth daily.        Marland Kitchen HYDROcodone-acetaminophen (NORCO) 7.5-325 MG per tablet Take 1 tablet by mouth every 6 (six) hours as needed (Pain). Tolerating 1/2 tablet as needed      . Multiple Vitamin (MULTIVITAMIN WITH MINERALS) TABS tablet Take 1 tablet by mouth daily.      . polyvinyl alcohol (LIQUIFILM TEARS) 1.4 % ophthalmic solution Place 1 drop into both eyes as needed for dry  eyes.      Marland Kitchen SYNTHROID 150 MCG tablet TAKE 1 TABLET DAILY  90 tablet  3  . valsartan-hydrochlorothiazide (DIOVAN-HCT) 320-25 MG per tablet TAKE 1 TABLET DAILY  30 tablet  5  . zolpidem (AMBIEN) 10 MG tablet Take 5 mg by mouth at bedtime.      . valsartan-hydrochlorothiazide (DIOVAN-HCT) 320-12.5 MG per tablet Take 1 tablet by mouth every evening.        No results found for this or any previous visit (from the past 48 hour(s)). No results found.  Review of Systems  All other systems reviewed and are negative.   Blood pressure 148/67, pulse 72, temperature 97.5 F (36.4 C), temperature source Oral, resp. rate 20, height 5\' 5"  (1.651 m), weight 72.122 kg (159 lb), SpO2 98.00%. Physical Exam  Constitutional: She is oriented to person, place, and time. She appears well-developed and well-nourished.  HENT:  Head: Normocephalic and  atraumatic.  Eyes: EOM are normal. Pupils are equal, round, and reactive to light.  Neck: Normal range of motion. Neck supple.  Respiratory: Effort normal and breath sounds normal.  GI: Soft. Bowel sounds are normal.  Musculoskeletal:       Left shoulder: She exhibits decreased range of motion, bony tenderness and crepitus.  Neurological: She is alert and oriented to person, place, and time.  Skin: Skin is warm and dry.  Psychiatric: She has a normal mood and affect.     Assessment/Plan Left shoulder pain and impingement syndrome. Failed conservative treatment 1) To the OR today for a left shoulder arthroscopy with debridement, subacromial decompression, and distal clavicle ressection  Miriam Kestler Y 09/18/2013, 12:13 PM

## 2013-09-18 NOTE — Anesthesia Procedure Notes (Signed)
Anesthesia Regional Block:  Interscalene brachial plexus block  Pre-Anesthetic Checklist: ,, timeout performed, Correct Patient, Correct Site, Correct Laterality, Correct Procedure, Correct Position, site marked, Risks and benefits discussed,  Surgical consent,  Pre-op evaluation,  At surgeon's request and post-op pain management  Laterality: Left  Prep: chloraprep       Needles:  Injection technique: Single-shot  Needle Type: Echogenic Stimulator Needle          Additional Needles:  Procedures: ultrasound guided (picture in chart) Interscalene brachial plexus block Narrative:  Start time: 09/18/2013 2:00 PM End time: 09/18/2013 2:05 PM Injection made incrementally with aspirations every 5 mL.  Performed by: Personally  Anesthesiologist: Freddie Apley MD

## 2013-09-18 NOTE — Transfer of Care (Signed)
Immediate Anesthesia Transfer of Care Note  Patient: [name]  Procedure(s) Performed: Procedure(s) (LRB): LEFT SHOULDER ARTHROSCOPY WITH DEBRIDEMENT, SUBACROMIAL DECOMPRESSION AND DISTAL CLAVICLE RESECTION (Left)  Patient Location: PACU  Anesthesia Type: GA combined with regional for post-op pain (ISB)  Level of Consciousness: sedated, patient cooperative and responds to stimulation  Airway & Oxygen Therapy: Patient Spontanous Breathing and Patient connected to face mask oxgen  Post-op Assessment: Report given to PACU RN and Post -op Vital signs reviewed and stable  Post vital signs: Reviewed and stable  Complications: No apparent anesthesia complications

## 2013-09-18 NOTE — Anesthesia Postprocedure Evaluation (Signed)
Anesthesia Post Note  Patient: [name]  Procedure(s) Performed: Procedure(s) (LRB): LEFT SHOULDER ARTHROSCOPY WITH DEBRIDEMENT, SUBACROMIAL DECOMPRESSION AND DISTAL CLAVICLE RESECTION (Left)  Anesthesia type: General  Patient location: PACU  Post pain: Pain level controlled  Post assessment: Post-op Vital signs reviewed  Last Vitals:  Filed Vitals:   09/18/13 1745  BP: 115/47  Pulse: 54  Temp:   Resp: 13    Post vital signs: Reviewed  Level of consciousness: sedated  Complications: No apparent anesthesia complications

## 2013-09-18 NOTE — Anesthesia Preprocedure Evaluation (Signed)
Anesthesia Evaluation  Patient identified by MRN, date of birth, ID band Patient awake    Reviewed: Allergy & Precautions, H&P , NPO status , Patient's Chart, lab work & pertinent test results  Airway Mallampati: II TM Distance: >3 FB Neck ROM: Full    Dental  (+) Edentulous Upper, Edentulous Lower   Pulmonary neg pulmonary ROS,  breath sounds clear to auscultation  Pulmonary exam normal       Cardiovascular hypertension, Pt. on medications Rhythm:Regular Rate:Normal     Neuro/Psych negative neurological ROS  negative psych ROS   GI/Hepatic Neg liver ROS, PUD,   Endo/Other  Hypothyroidism   Renal/GU negative Renal ROS  negative genitourinary   Musculoskeletal negative musculoskeletal ROS (+)   Abdominal   Peds negative pediatric ROS (+)  Hematology negative hematology ROS (+)   Anesthesia Other Findings   Reproductive/Obstetrics                           Anesthesia Physical Anesthesia Plan  ASA: III  Anesthesia Plan: General   Post-op Pain Management:    Induction: Intravenous  Airway Management Planned: Oral ETT  Additional Equipment:   Intra-op Plan:   Post-operative Plan: Extubation in OR  Informed Consent: I have reviewed the patients History and Physical, chart, labs and discussed the procedure including the risks, benefits and alternatives for the proposed anesthesia with the patient or authorized representative who has indicated his/her understanding and acceptance.   Dental advisory given  Plan Discussed with: CRNA  Anesthesia Plan Comments:         Anesthesia Quick Evaluation

## 2013-09-19 NOTE — Op Note (Signed)
NAMETINEA, NOBILE             ACCOUNT NO.:  [id number]  MEDICAL RECORD NO.:  [id number]  LOCATION:  [place]                         FACILITY:  [facility]  PHYSICIAN:  Lind Guest. Ninfa Linden, M.D.DATE OF BIRTH:  March 24, 1925  DATE OF PROCEDURE:  09/18/2013 DATE OF DISCHARGE:                              OPERATIVE REPORT   PREOPERATIVE DIAGNOSES:  Severe left shoulder impingement syndrome with acromioclavicular joint arthritis and partial thickness rotator cuff tear.  POSTOPERATIVE DIAGNOSES: 1. Left shoulder severe impingement syndrome with acromioclavicular     joint arthropathy. 2. Full-thickness and retracted rotator cuff tear.  PROCEDURE:  Left shoulder arthroscopy with extensive debridement, subacromial decompression, and arthroscopically assisted distal clavicle resection.  SURGEON:  Lind Guest. Ninfa Linden, MD  ASSISTANT:  Erskine Emery, PA-C  ANESTHESIA: 1. Interscalene block, left shoulder. 2. General.  BLOOD LOSS:  Minimal.  COMPLICATIONS:  None.  INDICATIONS:  Ms. [name] is a pleasant 80 year old female with debilitating pain involving her left shoulder.  This has greatly affected her activities of daily living and her quality of life.  She has had at least 3 steroid injections in her shoulder.  She has tried physical therapy, rest, ice, heat, anti-inflammatories, and pain medications.  She has gotten to the point where the pain has become so debilitating, she wished to proceed with arthroscopic intervention.  She understands at her age rotator cuff may be difficult to repair given soft bone and quality of the tissue.  She understands that we will be going in there to debride the soft tissue, create more of a space to subacromial ______ and debride the distal clavicle AC joint.  The risks of the surgery were explained to her in detail and she does wish to proceed with surgery.  PROCEDURE DESCRIPTION:  After informed consent was obtained, appropriate left  shoulder was marked.  Anesthesia obtained of interscalene block. She was then brought to the operating room, placed supine on the operating table, general anesthesia was then obtained.  She was fashioned in a beach-chair position with appropriate positioning of the head and neck and padding down on the operative right arm.  There was bending at the waist and knees, and her left operative shoulder was then placed in-line skeletal traction device using 10 pounds of traction and sterile drapes.  Forward flexion was about 45 degrees and rotation was neutral.  Time-out was called to identify correct patient, correct left shoulder.  We then made a posterolateral arthroscopy portal off the posterolateral acromion and into the glenohumeral joint and found significant changes throughout the glenohumeral joint.  There is now a full-thickness and retracted rotator cuff tear.  The biceps tendon was intact as well as the subscapularis tendon.  There was significant cartilage loss on the humeral head and there was degenerative tearing of the anterior labrum.  There was significant synovitis throughout the shoulder as well.  We made an anterior portal in the rotator interval with a soft tissue ablation wand, carried out extensive debridement in the glenohumeral joint including capsular tissue that was inflamed.  The anterior labrum and the remaining undersurface of the rotator cuff.  We then entered the subacromial space through the posterior portal and made a separate lateral portal.  We used a high-speed bur underneath the acromion and did remove bone spurs as well as to debride and shaved some of the distal clavicle.  We also used a soft tissue ablation wand to remove thickened tissue from the rotator cuff and removed the bursa.  We found a full-thickness and retracted rotator cuff tear, but the humeral head was too soft to accept any type of screw for fixation.  Given her age, we felt this was not  prudent as well.  We then allowed fluid lavage to the shoulder and removed all instrumentation from the shoulder.  We closed the 3 portal sites with interrupted nylon suture.  A well-padded, sterile dressing was applied and her arm was placed in a sling.  She was awakened, extubated, and taken to recovery room in stable condition. All final counts were correct.  There were no complications noted.     Lind Guest. Ninfa Linden, M.D.     CYB/MEDQ  D:  09/18/2013  T:  09/19/2013  Job:  [id number]

## 2013-09-19 NOTE — Progress Notes (Signed)
Discharged from floor via w/c, family with pt. No changes in assessment. Melodie Ashworth  

## 2013-09-19 NOTE — Discharge Summary (Signed)
Physician Discharge Summary  Patient ID: DANAIJA ESKRIDGE MRN: [id number] DOB/AGE: [birth year] 80 y.o.  Admit date: 09/18/2013 Discharge date: 09/19/2013  Admission Diagnoses: Impingement left shoulder  Discharge Diagnoses: Same Principal Problem:   Impingement syndrome of left shoulder Active Problems:   S/P arthroscopy of shoulder   Discharged Condition: stable  Hospital Course: Patient's hospital course was essentially unremarkable. She underwent arthroscopy for impingement of the left shoulder. Postoperatively she progressed well and was discharged to home in stable condition.  Consults: None  Significant Diagnostic Studies: labs: Routine labs  Treatments: surgery: See operative note  Discharge Exam: Blood pressure 96/55, pulse 67, temperature 98.6 F (37 C), temperature source Oral, resp. rate 16, height 5\' 5"  (1.651 m), weight 72.122 kg (159 lb), SpO2 98.00%. Incision/Wound: dressing clean dry and intact  Disposition:   Discharge Instructions   Call MD / Call 911    Complete by:  As directed   If you experience chest pain or shortness of breath, CALL 911 and be transported to the hospital emergency room.  If you develope a fever above 101 F, pus (white drainage) or increased drainage or redness at the wound, or calf pain, call your surgeon's office.     Constipation Prevention    Complete by:  As directed   Drink plenty of fluids.  Prune juice may be helpful.  You may use a stool softener, such as Colace (over the counter) 100 mg twice a day.  Use MiraLax (over the counter) for constipation as needed.     Diet - low sodium heart healthy    Complete by:  As directed      Increase activity slowly as tolerated    Complete by:  As directed             Medication List         acetaminophen 325 MG tablet  Commonly known as:  TYLENOL  Take 650 mg by mouth every 6 (six) hours as needed (Pain).     acetaminophen-codeine 300-30 MG per tablet  Commonly known as:   TYLENOL #3  Take 1 tablet by mouth every 4 (four) hours as needed for moderate pain.     aluminum & magnesium hydroxide-simethicone 500-450-40 MG/5ML suspension  Commonly known as:  MYLANTA  Take 15 mLs by mouth every 6 (six) hours as needed for indigestion.     atorvastatin 80 MG tablet  Commonly known as:  LIPITOR  Take 80 mg by mouth daily. Takes in PM     calcium citrate-vitamin D 315-200 MG-UNIT per tablet  Commonly known as:  CITRACAL+D  Take 1 tablet by mouth daily.     HYDROcodone-acetaminophen 7.5-325 MG per tablet  Commonly known as:  NORCO  Take 1-2 tablets by mouth every 4 (four) hours as needed for moderate pain (Pain). Tolerating 1/2 tablet as needed     multivitamin with minerals Tabs tablet  Take 1 tablet by mouth daily.     polyvinyl alcohol 1.4 % ophthalmic solution  Commonly known as:  LIQUIFILM TEARS  Place 1 drop into both eyes as needed for dry eyes.     SYNTHROID 150 MCG tablet  Generic drug:  levothyroxine  TAKE 1 TABLET DAILY     valsartan-hydrochlorothiazide 320-12.5 MG per tablet  Commonly known as:  DIOVAN-HCT  Take 1 tablet by mouth every evening.     valsartan-hydrochlorothiazide 320-25 MG per tablet  Commonly known as:  DIOVAN-HCT  TAKE 1 TABLET DAILY     Vitamin D3  2000 UNITS Tabs  Take 1 tablet by mouth daily.     zolpidem 10 MG tablet  Commonly known as:  AMBIEN  Take 5 mg by mouth at bedtime.           Follow-up Information   Follow up with Mcarthur Rossetti, MD In 1 week.   Specialty:  Orthopedic Surgery   Contact information:   [place] [address] [phone]       Signed: Newt Minion 09/19/2013, 10:35 AM

## 2013-10-19 ENCOUNTER — Ambulatory Visit: Payer: Medicare Other | Attending: Orthopaedic Surgery | Admitting: Physical Therapy

## 2013-10-19 DIAGNOSIS — IMO0001 Reserved for inherently not codable concepts without codable children: Secondary | ICD-10-CM | POA: Diagnosis not present

## 2013-10-19 DIAGNOSIS — M25619 Stiffness of unspecified shoulder, not elsewhere classified: Secondary | ICD-10-CM | POA: Insufficient documentation

## 2013-10-19 DIAGNOSIS — M25519 Pain in unspecified shoulder: Secondary | ICD-10-CM | POA: Diagnosis not present

## 2013-10-19 DIAGNOSIS — R5381 Other malaise: Secondary | ICD-10-CM | POA: Diagnosis not present

## 2013-10-22 ENCOUNTER — Ambulatory Visit: Payer: Medicare Other | Admitting: *Deleted

## 2013-10-22 DIAGNOSIS — M25519 Pain in unspecified shoulder: Secondary | ICD-10-CM | POA: Diagnosis not present

## 2013-10-22 DIAGNOSIS — R5381 Other malaise: Secondary | ICD-10-CM | POA: Diagnosis not present

## 2013-10-22 DIAGNOSIS — M25619 Stiffness of unspecified shoulder, not elsewhere classified: Secondary | ICD-10-CM | POA: Diagnosis not present

## 2013-10-22 DIAGNOSIS — IMO0001 Reserved for inherently not codable concepts without codable children: Secondary | ICD-10-CM | POA: Diagnosis not present

## 2013-10-27 ENCOUNTER — Ambulatory Visit: Payer: Medicare Other | Admitting: Physical Therapy

## 2013-10-27 DIAGNOSIS — M25619 Stiffness of unspecified shoulder, not elsewhere classified: Secondary | ICD-10-CM | POA: Diagnosis not present

## 2013-10-27 DIAGNOSIS — R5381 Other malaise: Secondary | ICD-10-CM | POA: Diagnosis not present

## 2013-10-27 DIAGNOSIS — IMO0001 Reserved for inherently not codable concepts without codable children: Secondary | ICD-10-CM | POA: Diagnosis not present

## 2013-10-27 DIAGNOSIS — M25519 Pain in unspecified shoulder: Secondary | ICD-10-CM | POA: Diagnosis not present

## 2013-10-29 ENCOUNTER — Ambulatory Visit: Payer: Medicare Other | Admitting: Physical Therapy

## 2013-10-29 DIAGNOSIS — IMO0001 Reserved for inherently not codable concepts without codable children: Secondary | ICD-10-CM | POA: Diagnosis not present

## 2013-10-29 DIAGNOSIS — R5381 Other malaise: Secondary | ICD-10-CM | POA: Diagnosis not present

## 2013-10-29 DIAGNOSIS — M25619 Stiffness of unspecified shoulder, not elsewhere classified: Secondary | ICD-10-CM | POA: Diagnosis not present

## 2013-10-29 DIAGNOSIS — M25519 Pain in unspecified shoulder: Secondary | ICD-10-CM | POA: Diagnosis not present

## 2013-11-03 ENCOUNTER — Ambulatory Visit: Payer: Medicare Other | Admitting: Physical Therapy

## 2013-11-03 DIAGNOSIS — IMO0001 Reserved for inherently not codable concepts without codable children: Secondary | ICD-10-CM | POA: Diagnosis not present

## 2013-11-03 DIAGNOSIS — R5381 Other malaise: Secondary | ICD-10-CM | POA: Diagnosis not present

## 2013-11-03 DIAGNOSIS — M25619 Stiffness of unspecified shoulder, not elsewhere classified: Secondary | ICD-10-CM | POA: Diagnosis not present

## 2013-11-03 DIAGNOSIS — M25519 Pain in unspecified shoulder: Secondary | ICD-10-CM | POA: Diagnosis not present

## 2013-11-04 DIAGNOSIS — M25519 Pain in unspecified shoulder: Secondary | ICD-10-CM | POA: Diagnosis not present

## 2013-11-05 ENCOUNTER — Ambulatory Visit: Payer: Medicare Other | Admitting: Physical Therapy

## 2013-11-05 DIAGNOSIS — R5381 Other malaise: Secondary | ICD-10-CM | POA: Diagnosis not present

## 2013-11-05 DIAGNOSIS — M25519 Pain in unspecified shoulder: Secondary | ICD-10-CM | POA: Diagnosis not present

## 2013-11-05 DIAGNOSIS — IMO0001 Reserved for inherently not codable concepts without codable children: Secondary | ICD-10-CM | POA: Diagnosis not present

## 2013-11-05 DIAGNOSIS — M25619 Stiffness of unspecified shoulder, not elsewhere classified: Secondary | ICD-10-CM | POA: Diagnosis not present

## 2013-11-10 ENCOUNTER — Encounter: Payer: Medicare Other | Admitting: Physical Therapy

## 2013-11-12 ENCOUNTER — Encounter: Payer: Medicare Other | Admitting: Physical Therapy

## 2013-11-24 ENCOUNTER — Encounter: Payer: Self-pay | Admitting: Family Medicine

## 2013-11-24 ENCOUNTER — Ambulatory Visit (INDEPENDENT_AMBULATORY_CARE_PROVIDER_SITE_OTHER): Payer: Medicare Other | Admitting: Family Medicine

## 2013-11-24 VITALS — BP 91/55 | HR 59 | Temp 96.2°F | Ht 65.0 in | Wt 155.0 lb

## 2013-11-24 DIAGNOSIS — E039 Hypothyroidism, unspecified: Secondary | ICD-10-CM

## 2013-11-24 DIAGNOSIS — E8881 Metabolic syndrome: Secondary | ICD-10-CM | POA: Diagnosis not present

## 2013-11-24 DIAGNOSIS — M25551 Pain in right hip: Secondary | ICD-10-CM

## 2013-11-24 DIAGNOSIS — M25559 Pain in unspecified hip: Secondary | ICD-10-CM | POA: Diagnosis not present

## 2013-11-24 DIAGNOSIS — M5441 Lumbago with sciatica, right side: Secondary | ICD-10-CM

## 2013-11-24 DIAGNOSIS — G4700 Insomnia, unspecified: Secondary | ICD-10-CM

## 2013-11-24 DIAGNOSIS — M5430 Sciatica, unspecified side: Secondary | ICD-10-CM | POA: Diagnosis not present

## 2013-11-24 DIAGNOSIS — E559 Vitamin D deficiency, unspecified: Secondary | ICD-10-CM | POA: Diagnosis not present

## 2013-11-24 DIAGNOSIS — E785 Hyperlipidemia, unspecified: Secondary | ICD-10-CM | POA: Diagnosis not present

## 2013-11-24 DIAGNOSIS — I10 Essential (primary) hypertension: Secondary | ICD-10-CM

## 2013-11-24 LAB — POCT GLYCOSYLATED HEMOGLOBIN (HGB A1C): Hemoglobin A1C: 6

## 2013-11-24 LAB — POCT CBC
GRANULOCYTE PERCENT: 63.8 % (ref 37–80)
HEMATOCRIT: 40.8 % (ref 37.7–47.9)
Hemoglobin: 13.3 g/dL (ref 12.2–16.2)
LYMPH, POC: 2.8 (ref 0.6–3.4)
MCH, POC: 31.1 pg (ref 27–31.2)
MCHC: 32.6 g/dL (ref 31.8–35.4)
MCV: 95.5 fL (ref 80–97)
MPV: 9.8 fL (ref 0–99.8)
POC GRANULOCYTE: 5.2 (ref 2–6.9)
POC LYMPH PERCENT: 34.2 %L (ref 10–50)
Platelet Count, POC: 134 10*3/uL — AB (ref 142–424)
RBC: 4.3 M/uL (ref 4.04–5.48)
RDW, POC: 13.8 %
WBC: 8.2 10*3/uL (ref 4.6–10.2)

## 2013-11-24 MED ORDER — ZOLPIDEM TARTRATE 10 MG PO TABS: 10.0000 mg | ORAL_TABLET | Freq: Every evening | ORAL | Status: DC | PRN

## 2013-11-24 NOTE — Progress Notes (Signed)
Subjective:    Patient ID: [name], female    DOB: 02-29-1926, 80 y.o.   MRN: [id number]  HPI Pt here for follow up and management of chronic medical problems. The patient's main complaint today is that she's not sleeping well at night. She is due to get lab work today. She is up-to-date on her health maintenance parameters. She requests a refill on her Ambien. She is also due to get her lab work today. The patient recently had left shoulder surgery and was in the hospital overnight. She is doing much better from this and resting better and in less pain. She continues to have problems with her back and her right ear and she has a visit with the orthopedists soon for possible injection. She had an EKG when she was in the hospital. The shoulder surgery was done on August 10.        Patient Active Problem List   Diagnosis Date Noted  . Impingement syndrome of left shoulder 09/18/2013  . S/P arthroscopy of shoulder 09/18/2013  . Low back pain 03/17/2013  . Neuropathy 03/17/2013  . Metabolic syndrome 36/46/8032  . Overweight (BMI 25.0-29.9) 03/17/2013  . Vitamin D deficiency 12/01/2012  . Chronic insomnia 12/01/2012  . Fibrocystic breast changes 05/21/2011  . Hypertension   . Hyperlipidemia   . Peptic ulcer disease   . ITP (idiopathic thrombocytopenic purpura)   . Rhinitis   . Elevated blood sugar   . Hypothyroidism   . Goiter   . Colon polyps   . Adhesion of abdominal wall   . Esophageal stricture    Outpatient Encounter Prescriptions as of 11/24/2013  Medication Sig  . acetaminophen (TYLENOL) 325 MG tablet Take 650 mg by mouth every 6 (six) hours as needed (Pain).  Marland Kitchen acetaminophen-codeine (TYLENOL #3) 300-30 MG per tablet Take 1 tablet by mouth every 4 (four) hours as needed for moderate pain.  Marland Kitchen aluminum & magnesium hydroxide-simethicone (MYLANTA) 500-450-40 MG/5ML suspension Take 15 mLs by mouth every 6 (six) hours as needed for indigestion.  Marland Kitchen atorvastatin (LIPITOR)  80 MG tablet Take 80 mg by mouth daily. Takes in PM  . calcium citrate-vitamin D (CITRACAL+D) 315-200 MG-UNIT per tablet Take 1 tablet by mouth daily.  . Cholecalciferol (VITAMIN D3) 2000 UNITS TABS Take 1 tablet by mouth daily.    Marland Kitchen HYDROcodone-acetaminophen (NORCO) 7.5-325 MG per tablet Take 1-2 tablets by mouth every 4 (four) hours as needed for moderate pain (Pain). Tolerating 1/2 tablet as needed  . Multiple Vitamin (MULTIVITAMIN WITH MINERALS) TABS tablet Take 1 tablet by mouth daily.  . polyvinyl alcohol (LIQUIFILM TEARS) 1.4 % ophthalmic solution Place 1 drop into both eyes as needed for dry eyes.  Marland Kitchen SYNTHROID 150 MCG tablet TAKE 1 TABLET DAILY  . valsartan-hydrochlorothiazide (DIOVAN-HCT) 320-25 MG per tablet TAKE 1 TABLET DAILY  . zolpidem (AMBIEN) 10 MG tablet Take 5 mg by mouth at bedtime.  . [DISCONTINUED] valsartan-hydrochlorothiazide (DIOVAN-HCT) 320-12.5 MG per tablet Take 1 tablet by mouth every evening.    Review of Systems  Constitutional: Negative.        Not sleeping well at night  HENT: Negative.   Eyes: Negative.   Respiratory: Negative.   Cardiovascular: Negative.   Gastrointestinal: Negative.   Endocrine: Negative.   Genitourinary: Negative.   Musculoskeletal: Negative.   Skin: Negative.   Allergic/Immunologic: Negative.   Neurological: Negative.   Hematological: Negative.   Psychiatric/Behavioral: Negative.        Objective:   Physical Exam  Nursing note and vitals reviewed. Constitutional: She is oriented to person, place, and time. She appears well-developed and well-nourished. No distress.  The patient is pleasant, cooperative and alert. She seems to be doing much better since her recent left shoulder surgery.  HENT:  Head: Normocephalic and atraumatic.  Right Ear: External ear normal.  Left Ear: External ear normal.  Nose: Nose normal.  Mouth/Throat: Oropharynx is clear and moist.  Eyes: Conjunctivae and EOM are normal. Pupils are equal, round,  and reactive to light. Right eye exhibits no discharge. Left eye exhibits no discharge. No scleral icterus.  Neck: Normal range of motion. Neck supple. No thyromegaly present.  No carotid bruits were audible  Cardiovascular: Normal rate, regular rhythm, normal heart sounds and intact distal pulses.  Exam reveals no gallop and no friction rub.   No murmur heard. At 84 per minute  Pulmonary/Chest: Effort normal and breath sounds normal. No respiratory distress. She has no wheezes. She has no rales. She exhibits no tenderness.  Abdominal: Soft. Bowel sounds are normal. She exhibits no mass. There is no tenderness. There is no rebound and no guarding.  Mild obesity with abdominal surgical scars  Musculoskeletal: Normal range of motion. She exhibits no edema and no tenderness.  Patient uses a cane for walking. She has good mobility of her left shoulder with minimal pain. Leg raising was good bilaterally and hip abduction was good bilaterally with minimal pain  Lymphadenopathy:    She has no cervical adenopathy.  Neurological: She is alert and oriented to person, place, and time. She has normal reflexes. No cranial nerve deficit.  Skin: Skin is warm and dry. No rash noted.  Psychiatric: She has a normal mood and affect. Her behavior is normal. Judgment and thought content normal.    BP 91/55  Pulse 59  Temp(Src) 96.2 F (35.7 C) (Oral)  Ht 5' 5"  (1.651 m)  Wt 155 lb (70.308 kg)  BMI 25.79 kg/m2       Assessment & Plan:  1. Hypertension - POCT CBC - BMP8+EGFR - Hepatic function panel  2. Hyperlipidemia - POCT CBC - NMR, lipoprofile  3. Vitamin D deficiency - POCT CBC - Vit D  25 hydroxy (rtn osteoporosis monitoring)  4. Metabolic syndrome - POCT CBC - POCT glycosylated hemoglobin (Hb A1C)  5. Hypothyroidism, unspecified hypothyroidism type - POCT CBC  6. Insomnia  7. Right hip pain  8. Midline low back pain with right-sided sciatica  Meds ordered this encounter    Medications  . zolpidem (AMBIEN) 10 MG tablet    Sig: Take 1 tablet (10 mg total) by mouth at bedtime as needed for sleep.    Dispense:  30 tablet    Refill:  1   Patient Instructions                       Medicare Annual Wellness Visit  [facility] and the medical providers at [facility] strive to bring you the best medical care.  In doing so we not only want to address your current medical conditions and concerns but also to detect new conditions early and prevent illness, disease and health-related problems.    Medicare offers a yearly Wellness Visit which allows our clinical staff to assess your need for preventative services including immunizations, lifestyle education, counseling to decrease risk of preventable diseases and screening for fall risk and other medical concerns.    This visit is provided free of charge (no  copay) for all Medicare recipients. The clinical pharmacists at [facility] have begun to conduct these Wellness Visits which will also include a thorough review of all your medications.    As you primary medical provider recommend that you make an appointment for your Annual Wellness Visit if you have not done so already this year.  You may set up this appointment before you leave today or you may call back ([phone]) and schedule an appointment.  Please make sure when you call that you mention that you are scheduling your Annual Wellness Visit with the clinical pharmacist so that the appointment may be made for the proper length of time.     Continue current medications. Continue good therapeutic lifestyle changes which include good diet and exercise. Fall precautions discussed with patient. If an FOBT was given today- please return it to our front desk. If you are over 8 years old - you may need Prevnar 53 or the adult Pneumonia vaccine.  Flu Shots will be available at our office starting mid- September. Please call and  schedule a FLU CLINIC APPOINTMENT.   Continue followup with orthopedist Continue to be careful and did not put yourself at risk for falling We will call you with your lab work once those results are available   Arrie Senate MD

## 2013-11-24 NOTE — Patient Instructions (Addendum)
Medicare Annual Wellness Visit  [facility] and the medical providers at [facility] strive to bring you the best medical care.  In doing so we not only want to address your current medical conditions and concerns but also to detect new conditions early and prevent illness, disease and health-related problems.    Medicare offers a yearly Wellness Visit which allows our clinical staff to assess your need for preventative services including immunizations, lifestyle education, counseling to decrease risk of preventable diseases and screening for fall risk and other medical concerns.    This visit is provided free of charge (no copay) for all Medicare recipients. The clinical pharmacists at [facility] have begun to conduct these Wellness Visits which will also include a thorough review of all your medications.    As you primary medical provider recommend that you make an appointment for your Annual Wellness Visit if you have not done so already this year.  You may set up this appointment before you leave today or you may call back ([phone]) and schedule an appointment.  Please make sure when you call that you mention that you are scheduling your Annual Wellness Visit with the clinical pharmacist so that the appointment may be made for the proper length of time.     Continue current medications. Continue good therapeutic lifestyle changes which include good diet and exercise. Fall precautions discussed with patient. If an FOBT was given today- please return it to our front desk. If you are over 73 years old - you may need Prevnar 42 or the adult Pneumonia vaccine.  Flu Shots will be available at our office starting mid- September. Please call and schedule a FLU CLINIC APPOINTMENT.   Continue followup with orthopedist Continue to be careful and did not put yourself at risk for falling We will call you with your lab work once those  results are available

## 2013-11-25 LAB — NMR, LIPOPROFILE
Cholesterol: 135 mg/dL (ref 100–199)
HDL CHOLESTEROL BY NMR: 60 mg/dL (ref >39)
HDL Particle Number: 40.4 umol/L (ref >=30.5)
LDL PARTICLE NUMBER: 587 nmol/L (ref <1000)
LDL Size: 21 nm (ref >20.5)
LDLC SERPL CALC-MCNC: 53 mg/dL (ref 0–99)
LP-IR SCORE: 29 (ref <=45)
Small LDL Particle Number: 322 nmol/L (ref <=527)
Triglycerides by NMR: 108 mg/dL (ref 0–149)

## 2013-11-25 LAB — HEPATIC FUNCTION PANEL
ALK PHOS: 80 IU/L (ref 39–117)
ALT: 8 IU/L (ref 0–32)
AST: 19 IU/L (ref 0–40)
Albumin: 4 g/dL (ref 3.5–4.7)
Bilirubin, Direct: 0.13 mg/dL (ref 0.00–0.40)
Total Bilirubin: 0.4 mg/dL (ref 0.0–1.2)
Total Protein: 7.3 g/dL (ref 6.0–8.5)

## 2013-11-25 LAB — BMP8+EGFR
BUN / CREAT RATIO: 17 (ref 11–26)
BUN: 14 mg/dL (ref 8–27)
CHLORIDE: 101 mmol/L (ref 97–108)
CO2: 27 mmol/L (ref 18–29)
Calcium: 10 mg/dL (ref 8.7–10.3)
Creatinine, Ser: 0.83 mg/dL (ref 0.57–1.00)
GFR calc non Af Amer: 64 mL/min/{1.73_m2} (ref >59)
GFR, EST AFRICAN AMERICAN: 74 mL/min/{1.73_m2} (ref >59)
Glucose: 112 mg/dL — ABNORMAL HIGH (ref 65–99)
POTASSIUM: 3.9 mmol/L (ref 3.5–5.2)
Sodium: 142 mmol/L (ref 134–144)

## 2013-11-25 LAB — VITAMIN D 25 HYDROXY (VIT D DEFICIENCY, FRACTURES): Vit D, 25-Hydroxy: 40.3 ng/mL (ref 30.0–100.0)

## 2013-12-08 DIAGNOSIS — M48061 Spinal stenosis, lumbar region without neurogenic claudication: Secondary | ICD-10-CM | POA: Diagnosis not present

## 2013-12-08 DIAGNOSIS — IMO0002 Reserved for concepts with insufficient information to code with codable children: Secondary | ICD-10-CM | POA: Diagnosis not present

## 2013-12-22 ENCOUNTER — Ambulatory Visit (INDEPENDENT_AMBULATORY_CARE_PROVIDER_SITE_OTHER): Payer: Medicare Other

## 2013-12-22 DIAGNOSIS — Z23 Encounter for immunization: Secondary | ICD-10-CM | POA: Diagnosis not present

## 2014-01-05 DIAGNOSIS — M5417 Radiculopathy, lumbosacral region: Secondary | ICD-10-CM | POA: Diagnosis not present

## 2014-01-05 DIAGNOSIS — M4806 Spinal stenosis, lumbar region: Secondary | ICD-10-CM | POA: Diagnosis not present

## 2014-01-11 DIAGNOSIS — M5417 Radiculopathy, lumbosacral region: Secondary | ICD-10-CM | POA: Diagnosis not present

## 2014-01-11 DIAGNOSIS — M4806 Spinal stenosis, lumbar region: Secondary | ICD-10-CM | POA: Diagnosis not present

## 2014-01-22 ENCOUNTER — Other Ambulatory Visit: Payer: Self-pay | Admitting: Family Medicine

## 2014-01-22 NOTE — Telephone Encounter (Signed)
This prescription is okay to refill 

## 2014-01-22 NOTE — Telephone Encounter (Signed)
Pt aware and med [facility] 

## 2014-01-22 NOTE — Telephone Encounter (Signed)
Last filled 12/24/13, last seen 11/24/13. Route to pool A, nurse call into [facility]

## 2014-01-25 DIAGNOSIS — M4806 Spinal stenosis, lumbar region: Secondary | ICD-10-CM | POA: Diagnosis not present

## 2014-01-25 DIAGNOSIS — M5417 Radiculopathy, lumbosacral region: Secondary | ICD-10-CM | POA: Diagnosis not present

## 2014-02-10 DIAGNOSIS — M5417 Radiculopathy, lumbosacral region: Secondary | ICD-10-CM | POA: Diagnosis not present

## 2014-02-10 DIAGNOSIS — M4806 Spinal stenosis, lumbar region: Secondary | ICD-10-CM | POA: Diagnosis not present

## 2014-03-03 ENCOUNTER — Other Ambulatory Visit: Payer: Self-pay | Admitting: Family Medicine

## 2014-03-13 ENCOUNTER — Other Ambulatory Visit: Payer: Self-pay | Admitting: Family Medicine

## 2014-03-22 ENCOUNTER — Other Ambulatory Visit: Payer: Self-pay | Admitting: Family Medicine

## 2014-03-23 NOTE — Telephone Encounter (Signed)
This is okay to refill for 6 months 

## 2014-03-23 NOTE — Telephone Encounter (Signed)
Last filled 02/20/14, last seen 11/24/13. Call into [facility]

## 2014-03-24 ENCOUNTER — Ambulatory Visit: Payer: Medicare Other | Admitting: Family Medicine

## 2014-03-25 ENCOUNTER — Encounter: Payer: Self-pay | Admitting: Family Medicine

## 2014-03-25 ENCOUNTER — Ambulatory Visit (INDEPENDENT_AMBULATORY_CARE_PROVIDER_SITE_OTHER): Payer: Medicare Other | Admitting: Family Medicine

## 2014-03-25 VITALS — BP 131/59 | HR 75 | Temp 97.4°F | Ht 65.0 in | Wt 162.0 lb

## 2014-03-25 DIAGNOSIS — D2322 Other benign neoplasm of skin of left ear and external auricular canal: Secondary | ICD-10-CM | POA: Diagnosis not present

## 2014-03-25 DIAGNOSIS — H6011 Cellulitis of right external ear: Secondary | ICD-10-CM

## 2014-03-25 MED ORDER — CEPHALEXIN 500 MG PO CAPS: 500.0000 mg | ORAL_CAPSULE | Freq: Three times a day (TID) | ORAL | Status: DC

## 2014-03-25 NOTE — Patient Instructions (Signed)
Take antibiotic as directed Discontinue the cortisone cream Clean behind the right ear and earlobe gently with some peroxide 3 or 4 times daily Clean the left ear pimple with some peroxide gently 3 or 4 times daily

## 2014-03-25 NOTE — Progress Notes (Signed)
Subjective:    Patient ID: [name], female    DOB: 1925-04-04, 81 y.o.   MRN: [id number]  HPI Patient here today for redness and soreness of face and ears. The right side is worse than the left. This is on for several weeks. There is no other rash and the patient has had her shingles shot. She asked he says the right side is more painful than the left.       Patient Active Problem List   Diagnosis Date Noted  . Insomnia 11/24/2013  . Impingement syndrome of left shoulder 09/18/2013  . S/P arthroscopy of shoulder 09/18/2013  . Low back pain 03/17/2013  . Neuropathy 03/17/2013  . Metabolic syndrome 85/63/1497  . Overweight (BMI 25.0-29.9) 03/17/2013  . Vitamin D deficiency 12/01/2012  . Chronic insomnia 12/01/2012  . Fibrocystic breast changes 05/21/2011  . Hypertension   . Hyperlipidemia   . Peptic ulcer disease   . ITP (idiopathic thrombocytopenic purpura)   . Rhinitis   . Elevated blood sugar   . Hypothyroidism   . Goiter   . Colon polyps   . Adhesion of abdominal wall   . Esophageal stricture    Outpatient Encounter Prescriptions as of 03/25/2014  Medication Sig  . acetaminophen (TYLENOL) 325 MG tablet Take 650 mg by mouth every 6 (six) hours as needed (Pain).  Marland Kitchen atorvastatin (LIPITOR) 80 MG tablet TAKE 1 TABLET ONCE A DAY  . calcium citrate-vitamin D (CITRACAL+D) 315-200 MG-UNIT per tablet Take 1 tablet by mouth daily.  . Cholecalciferol (VITAMIN D3) 2000 UNITS TABS Take 1 tablet by mouth daily.    . Multiple Vitamin (MULTIVITAMIN WITH MINERALS) TABS tablet Take 1 tablet by mouth daily.  Marland Kitchen omeprazole (PRILOSEC) 20 MG capsule Take 20 mg by mouth daily.  . polyvinyl alcohol (LIQUIFILM TEARS) 1.4 % ophthalmic solution Place 1 drop into both eyes as needed for dry eyes.  Marland Kitchen SYNTHROID 150 MCG tablet TAKE 1 TABLET DAILY  . valsartan-hydrochlorothiazide (DIOVAN-HCT) 320-25 MG per tablet TAKE 1 TABLET DAILY  . zolpidem (AMBIEN) 10 MG tablet TAKE 1 TABLET AT BEDTIME  AS NEEDED FOR SLEEP  . [DISCONTINUED] acetaminophen-codeine (TYLENOL #3) 300-30 MG per tablet Take 1 tablet by mouth every 4 (four) hours as needed for moderate pain.  . [DISCONTINUED] aluminum & magnesium hydroxide-simethicone (MYLANTA) 500-450-40 MG/5ML suspension Take 15 mLs by mouth every 6 (six) hours as needed for indigestion.  . [DISCONTINUED] atorvastatin (LIPITOR) 80 MG tablet Take 80 mg by mouth daily. Takes in PM  . [DISCONTINUED] HYDROcodone-acetaminophen (NORCO) 7.5-325 MG per tablet Take 1-2 tablets by mouth every 4 (four) hours as needed for moderate pain (Pain). Tolerating 1/2 tablet as needed    Review of Systems  Constitutional: Negative.   HENT: Negative.   Eyes: Negative.   Respiratory: Negative.   Cardiovascular: Negative.   Gastrointestinal: Negative.   Endocrine: Negative.   Genitourinary: Negative.   Musculoskeletal: Negative.   Skin: Positive for color change (red and pain - ears and face).  Allergic/Immunologic: Negative.   Neurological: Negative.   Hematological: Negative.   Psychiatric/Behavioral: Negative.        Objective:   Physical Exam  Constitutional: She appears well-developed and well-nourished. No distress.  The patient is alert and pleasant.  HENT:  Head: Normocephalic and atraumatic.  Nose: Nose normal.  Mouth/Throat: No oropharyngeal exudate.  The patient has what appears to be an wound behind the right earlobe. There is a pimple inflamed lesion of the left external ear. The  right ear is swollen in general. There is wax in the ear canals bilaterally.  Eyes: Conjunctivae and EOM are normal. Pupils are equal, round, and reactive to light. Right eye exhibits no discharge. Left eye exhibits no discharge. No scleral icterus.  Neck: Normal range of motion. Neck supple.  There is anterior cervical adenopathy that is tender on the right side  Cardiovascular: Normal rate, regular rhythm and normal heart sounds.   Pulmonary/Chest: Effort normal and  breath sounds normal. She has no wheezes. She has no rales.  Musculoskeletal: Normal range of motion.  Lymphadenopathy:    She has cervical adenopathy.  Neurological: She is alert.  Skin: Skin is warm and dry. Rash noted. There is erythema. No pallor.  See notes on ears for this section  Psychiatric: She has a normal mood and affect. Her behavior is normal. Judgment and thought content normal.  Nursing note and vitals reviewed.  BP 131/59 mmHg  Pulse 75  Temp(Src) 97.4 F (36.3 C) (Oral)  Ht 5\' 5"  (1.651 m)  Wt 162 lb (73.483 kg)  BMI 26.96 kg/m2        Assessment & Plan:  1. Cellulitis of right ear - cephALEXin (KEFLEX) 500 MG capsule; Take 1 capsule (500 mg total) by mouth 3 (three) times daily.  Dispense: 30 capsule; Refill: 0  2. Dermoid cyst of left ear - cephALEXin (KEFLEX) 500 MG capsule; Take 1 capsule (500 mg total) by mouth 3 (three) times daily.  Dispense: 30 capsule; Refill: 0  Patient Instructions  Take antibiotic as directed Discontinue the cortisone cream Clean behind the right ear and earlobe gently with some peroxide 3 or 4 times daily Clean the left ear pimple with some peroxide gently 3 or 4 times daily    Arrie Senate MD

## 2014-04-01 ENCOUNTER — Ambulatory Visit: Payer: Medicare Other | Admitting: Family Medicine

## 2014-04-06 ENCOUNTER — Encounter: Payer: Self-pay | Admitting: Family Medicine

## 2014-04-06 ENCOUNTER — Ambulatory Visit (INDEPENDENT_AMBULATORY_CARE_PROVIDER_SITE_OTHER): Payer: Medicare Other | Admitting: Family Medicine

## 2014-04-06 VITALS — BP 138/66 | HR 63 | Temp 97.2°F | Ht 65.0 in | Wt 165.0 lb

## 2014-04-06 DIAGNOSIS — R7309 Other abnormal glucose: Secondary | ICD-10-CM

## 2014-04-06 DIAGNOSIS — H9201 Otalgia, right ear: Secondary | ICD-10-CM | POA: Diagnosis not present

## 2014-04-06 DIAGNOSIS — E559 Vitamin D deficiency, unspecified: Secondary | ICD-10-CM | POA: Diagnosis not present

## 2014-04-06 DIAGNOSIS — E785 Hyperlipidemia, unspecified: Secondary | ICD-10-CM

## 2014-04-06 DIAGNOSIS — E039 Hypothyroidism, unspecified: Secondary | ICD-10-CM | POA: Diagnosis not present

## 2014-04-06 DIAGNOSIS — B029 Zoster without complications: Secondary | ICD-10-CM

## 2014-04-06 DIAGNOSIS — I10 Essential (primary) hypertension: Secondary | ICD-10-CM

## 2014-04-06 DIAGNOSIS — R739 Hyperglycemia, unspecified: Secondary | ICD-10-CM

## 2014-04-06 DIAGNOSIS — H9209 Otalgia, unspecified ear: Secondary | ICD-10-CM | POA: Diagnosis not present

## 2014-04-06 MED ORDER — VALACYCLOVIR HCL 1 G PO TABS: 1000.0000 mg | ORAL_TABLET | Freq: Three times a day (TID) | ORAL | Status: DC

## 2014-04-06 MED ORDER — ACETAMINOPHEN-CODEINE #3 300-30 MG PO TABS: ORAL_TABLET | ORAL | Status: DC

## 2014-04-06 NOTE — Patient Instructions (Addendum)
Medicare Annual Wellness Visit  [facility] and the medical providers at [facility] strive to bring you the best medical care.  In doing so we not only want to address your current medical conditions and concerns but also to detect new conditions early and prevent illness, disease and health-related problems.    Medicare offers a yearly Wellness Visit which allows our clinical staff to assess your need for preventative services including immunizations, lifestyle education, counseling to decrease risk of preventable diseases and screening for fall risk and other medical concerns.    This visit is provided free of charge (no copay) for all Medicare recipients. The clinical pharmacists at [facility] have begun to conduct these Wellness Visits which will also include a thorough review of all your medications.    As you primary medical provider recommend that you make an appointment for your Annual Wellness Visit if you have not done so already this year.  You may set up this appointment before you leave today or you may call back ([phone]) and schedule an appointment.  Please make sure when you call that you mention that you are scheduling your Annual Wellness Visit with the clinical pharmacist so that the appointment may be made for the proper length of time.     Continue current medications. Continue good therapeutic lifestyle changes which include good diet and exercise. Fall precautions discussed with patient. If an FOBT was given today- please return it to our front desk. If you are over 72 years old - you may need Prevnar 23 or the adult Pneumonia vaccine.  Flu Shots will be available at our office starting mid- September. Please call and schedule a FLU CLINIC APPOINTMENT.   Take Tylenol No. 3 one half to one to 3 times daily as needed for severe pain Take shingles medication as directed

## 2014-04-06 NOTE — Progress Notes (Signed)
Subjective:    Patient ID: [name], female    DOB: March 10, 1926, 81 y.o.   MRN: [id number]  HPI  Pt here for follow up and management of chronic medical problems which include hypothyroid, hypertension, and hyperlipidemia. She is taking all medications regularly. We will also follow up on her right ear pain and cellulitis. The patient was prescribed cephalexin for this. The patient has continued to have a lot of pain with the ear. She feels like she has had the shingles despite having had the Zostavax in the past. She is emphasizing the severity of the pain and even to the point where she is unable to have her hair washed. She is to get lab work today will be given an FOBT to return.          Patient Active Problem List   Diagnosis Date Noted  . Insomnia 11/24/2013  . Impingement syndrome of left shoulder 09/18/2013  . S/P arthroscopy of shoulder 09/18/2013  . Low back pain 03/17/2013  . Neuropathy 03/17/2013  . Metabolic syndrome 05/39/7673  . Overweight (BMI 25.0-29.9) 03/17/2013  . Vitamin D deficiency 12/01/2012  . Chronic insomnia 12/01/2012  . Fibrocystic breast changes 05/21/2011  . Hypertension   . Hyperlipidemia   . Peptic ulcer disease   . ITP (idiopathic thrombocytopenic purpura)   . Rhinitis   . Elevated blood sugar   . Hypothyroidism   . Goiter   . Colon polyps   . Adhesion of abdominal wall   . Esophageal stricture    Outpatient Encounter Prescriptions as of 04/06/2014  Medication Sig  . acetaminophen (TYLENOL) 325 MG tablet Take 650 mg by mouth every 6 (six) hours as needed (Pain).  Marland Kitchen atorvastatin (LIPITOR) 80 MG tablet TAKE 1 TABLET ONCE A DAY  . calcium citrate-vitamin D (CITRACAL+D) 315-200 MG-UNIT per tablet Take 1 tablet by mouth daily.  . Cholecalciferol (VITAMIN D3) 2000 UNITS TABS Take 1 tablet by mouth daily.    . Multiple Vitamin (MULTIVITAMIN WITH MINERALS) TABS tablet Take 1 tablet by mouth daily.  Marland Kitchen omeprazole (PRILOSEC) 20 MG capsule  Take 20 mg by mouth daily.  . polyvinyl alcohol (LIQUIFILM TEARS) 1.4 % ophthalmic solution Place 1 drop into both eyes as needed for dry eyes.  Marland Kitchen SYNTHROID 150 MCG tablet TAKE 1 TABLET DAILY  . valsartan-hydrochlorothiazide (DIOVAN-HCT) 320-25 MG per tablet TAKE 1 TABLET DAILY  . zolpidem (AMBIEN) 10 MG tablet TAKE 1 TABLET AT BEDTIME AS NEEDED FOR SLEEP  . [DISCONTINUED] cephALEXin (KEFLEX) 500 MG capsule Take 1 capsule (500 mg total) by mouth 3 (three) times daily.    Review of Systems  Constitutional: Negative.   HENT: Positive for ear pain (right ear pain and redness).   Eyes: Negative.   Respiratory: Negative.   Cardiovascular: Negative.   Gastrointestinal: Negative.   Endocrine: Negative.   Genitourinary: Negative.   Musculoskeletal: Negative.   Skin: Negative.   Allergic/Immunologic: Negative.   Neurological: Negative.   Hematological: Negative.   Psychiatric/Behavioral: Negative.        Objective:   Physical Exam  Constitutional: She is oriented to person, place, and time. She appears well-developed and well-nourished. She appears distressed.  HENT:  Head: Normocephalic and atraumatic.  Left Ear: External ear normal.  Nose: Nose normal.  Mouth/Throat: Oropharynx is clear and moist.  The redness and inflammation regarding the entire ear is definitely improved there are 2 scabs one on the back of the earlobe one on the front of the ear lobe.  The ear canal appears normal and that eardrum appears normal.  Eyes: Conjunctivae and EOM are normal. Pupils are equal, round, and reactive to light. Right eye exhibits no discharge. Left eye exhibits no discharge. No scleral icterus.  Neck: Normal range of motion. Neck supple. No thyromegaly present.  There is some tenderness in the right anterior cervical node area but there is no adenopathy.  Cardiovascular: Normal rate, regular rhythm and normal heart sounds.  Exam reveals no gallop and no friction rub.   No murmur heard. The  heart had a regular rate and rhythm at 72/m  Pulmonary/Chest: Effort normal and breath sounds normal. No respiratory distress. She has no wheezes. She has no rales. She exhibits no tenderness.  Abdominal: Soft. Bowel sounds are normal. She exhibits no mass. There is no tenderness. There is no rebound and no guarding.  Slight abdominal tenderness in the scar line  Musculoskeletal: Normal range of motion. She exhibits no edema.  Lymphadenopathy:    She has no cervical adenopathy.  Neurological: She is alert and oriented to person, place, and time.  Skin: Skin is warm and dry. Rash noted. There is erythema. No pallor.  The erythema regarding the right ear is improved there are 2 eschars one on the anterior earlobe along the posterior earlobe and there is actually some scarring of the right mandible which appears to be healing  Psychiatric: She has a normal mood and affect. Her behavior is normal. Judgment and thought content normal.  Nursing note and vitals reviewed.  BP 138/66 mmHg  Pulse 63  Temp(Src) 97.2 F (36.2 C) (Oral)  Ht 5' 5"  (1.651 m)  Wt 165 lb (74.844 kg)  BMI 27.46 kg/m2        Assessment & Plan:  1. Vitamin D deficiency -Continue vitamin D as doing and any changes in his treatment will be discussed once the lab work is returned - POCT CBC - Vit D  25 hydroxy (rtn osteoporosis monitoring)  2. Hypothyroidism, unspecified hypothyroidism type -Continue current treatment - POCT CBC  3. Essential hypertension -Blood pressure is good today continue current treatment - POCT CBC - BMP8+EGFR - Hepatic function panel  4. Hyperlipemia -Continue current cholesterol treatment and any changes will be determined pending lab work returned - POCT CBC - NMR, lipoprofile  5. Elevated blood sugar -Continue to watch diet as closely as possible and get as much exercise as tolerated - POCT CBC - BMP8+EGFR  6. Shingles rash - valACYclovir (VALTREX) 1000 MG tablet; Take 1  tablet (1,000 mg total) by mouth 3 (three) times daily.  Dispense: 21 tablet; Refill: 0 - acetaminophen-codeine (TYLENOL #3) 300-30 MG per tablet; 1/2 tab 3-4 times daily as needed for pain  Dispense: 30 tablet; Refill: 0  7. Ear pain, right - acetaminophen-codeine (TYLENOL #3) 300-30 MG per tablet; 1/2 tab 3-4 times daily as needed for pain  Dispense: 30 tablet; Refill: 0  Patient Instructions                       Medicare Annual Wellness Visit  [facility] and the medical providers at [facility] strive to bring you the best medical care.  In doing so we not only want to address your current medical conditions and concerns but also to detect new conditions early and prevent illness, disease and health-related problems.    Medicare offers a yearly Wellness Visit which allows our clinical staff to assess your need for preventative services including  immunizations, lifestyle education, counseling to decrease risk of preventable diseases and screening for fall risk and other medical concerns.    This visit is provided free of charge (no copay) for all Medicare recipients. The clinical pharmacists at [facility] have begun to conduct these Wellness Visits which will also include a thorough review of all your medications.    As you primary medical provider recommend that you make an appointment for your Annual Wellness Visit if you have not done so already this year.  You may set up this appointment before you leave today or you may call back ([phone]) and schedule an appointment.  Please make sure when you call that you mention that you are scheduling your Annual Wellness Visit with the clinical pharmacist so that the appointment may be made for the proper length of time.     Continue current medications. Continue good therapeutic lifestyle changes which include good diet and exercise. Fall precautions discussed with patient. If an FOBT was given  today- please return it to our front desk. If you are over 94 years old - you may need Prevnar 76 or the adult Pneumonia vaccine.  Flu Shots will be available at our office starting mid- September. Please call and schedule a FLU CLINIC APPOINTMENT.   Take Tylenol No. 3 one half to one to 3 times daily as needed for severe pain Take shingles medication as directed   Arrie Senate MD

## 2014-04-06 NOTE — Addendum Note (Signed)
Addended by: Selmer Dominion on: 04/06/2014 02:52 PM   Modules accepted: Orders

## 2014-04-07 LAB — BMP8+EGFR
BUN/Creatinine Ratio: 16 (ref 11–26)
BUN: 13 mg/dL (ref 8–27)
CO2: 29 mmol/L (ref 18–29)
Calcium: 9.4 mg/dL (ref 8.7–10.3)
Chloride: 100 mmol/L (ref 97–108)
Creatinine, Ser: 0.82 mg/dL (ref 0.57–1.00)
GFR calc Af Amer: 75 mL/min/{1.73_m2} (ref >59)
GFR, EST NON AFRICAN AMERICAN: 65 mL/min/{1.73_m2} (ref >59)
Glucose: 139 mg/dL — ABNORMAL HIGH (ref 65–99)
Potassium: 3.8 mmol/L (ref 3.5–5.2)
Sodium: 144 mmol/L (ref 134–144)

## 2014-04-07 LAB — NMR, LIPOPROFILE
Cholesterol: 136 mg/dL (ref 100–199)
HDL Cholesterol by NMR: 56 mg/dL (ref >39)
HDL PARTICLE NUMBER: 39.4 umol/L (ref >=30.5)
LDL Particle Number: 632 nmol/L (ref <1000)
LDL SIZE: 20.8 nm (ref >20.5)
LDL-C: 60 mg/dL (ref 0–99)
LP-IR SCORE: 46 — AB (ref <=45)
SMALL LDL PARTICLE NUMBER: 344 nmol/L (ref <=527)
TRIGLYCERIDES BY NMR: 102 mg/dL (ref 0–149)

## 2014-04-07 LAB — CBC WITH DIFFERENTIAL
Basophils Absolute: 0.1 10*3/uL (ref 0.0–0.2)
Basos: 1 %
EOS ABS: 0.4 10*3/uL (ref 0.0–0.4)
EOS: 5 %
HEMATOCRIT: 39.4 % (ref 34.0–46.6)
HEMOGLOBIN: 12.7 g/dL (ref 11.1–15.9)
Immature Grans (Abs): 0 10*3/uL (ref 0.0–0.1)
Immature Granulocytes: 0 %
Lymphocytes Absolute: 3.8 10*3/uL — ABNORMAL HIGH (ref 0.7–3.1)
Lymphs: 43 %
MCH: 31.3 pg (ref 26.6–33.0)
MCHC: 32.2 g/dL (ref 31.5–35.7)
MCV: 97 fL (ref 79–97)
Monocytes Absolute: 0.7 10*3/uL (ref 0.1–0.9)
Monocytes: 7 %
Neutrophils Absolute: 3.9 10*3/uL (ref 1.4–7.0)
Neutrophils Relative %: 44 %
RBC: 4.06 x10E6/uL (ref 3.77–5.28)
RDW: 13.1 % (ref 12.3–15.4)
WBC: 8.8 10*3/uL (ref 3.4–10.8)

## 2014-04-07 LAB — HEPATIC FUNCTION PANEL
ALBUMIN: 3.8 g/dL (ref 3.5–4.7)
ALK PHOS: 66 IU/L (ref 39–117)
ALT: 11 IU/L (ref 0–32)
AST: 23 IU/L (ref 0–40)
BILIRUBIN DIRECT: 0.17 mg/dL (ref 0.00–0.40)
TOTAL PROTEIN: 6.7 g/dL (ref 6.0–8.5)
Total Bilirubin: 0.4 mg/dL (ref 0.0–1.2)

## 2014-04-07 LAB — VITAMIN D 25 HYDROXY (VIT D DEFICIENCY, FRACTURES): VIT D 25 HYDROXY: 37.6 ng/mL (ref 30.0–100.0)

## 2014-04-07 LAB — SPECIMEN STATUS REPORT

## 2014-04-08 LAB — SPECIMEN STATUS REPORT

## 2014-04-08 LAB — PLATELET COUNT: Platelets: 184 10*3/uL (ref 150–379)

## 2014-04-20 ENCOUNTER — Encounter: Payer: Self-pay | Admitting: Family Medicine

## 2014-04-20 ENCOUNTER — Ambulatory Visit (INDEPENDENT_AMBULATORY_CARE_PROVIDER_SITE_OTHER): Payer: Medicare Other | Admitting: Family Medicine

## 2014-04-20 VITALS — BP 147/74 | HR 69 | Temp 97.3°F | Ht 65.0 in | Wt 164.0 lb

## 2014-04-20 DIAGNOSIS — B029 Zoster without complications: Secondary | ICD-10-CM

## 2014-04-20 NOTE — Progress Notes (Signed)
Subjective:    Patient ID: [name], female    DOB: [birth year], 81 y.o.   MRN: [id number]  HPI Patient here today for 2 week follow up on Shingles. The patient has finished a course of Valtrex and says that her ear and pain are feeling better. She still has some  discomfort. This medicine was not started until sometime after the initial problem with the rash.         Patient Active Problem List   Diagnosis Date Noted  . Insomnia 11/24/2013  . Impingement syndrome of left shoulder 09/18/2013  . S/P arthroscopy of shoulder 09/18/2013  . Low back pain 03/17/2013  . Neuropathy 03/17/2013  . Metabolic syndrome 60/73/7106  . Overweight (BMI 25.0-29.9) 03/17/2013  . Vitamin D deficiency 12/01/2012  . Chronic insomnia 12/01/2012  . Fibrocystic breast changes 05/21/2011  . Hypertension   . Hyperlipidemia   . Peptic ulcer disease   . ITP (idiopathic thrombocytopenic purpura)   . Rhinitis   . Elevated blood sugar   . Hypothyroidism   . Goiter   . Colon polyps   . Adhesion of abdominal wall   . Esophageal stricture    Outpatient Encounter Prescriptions as of 04/20/2014  Medication Sig  . acetaminophen (TYLENOL) 325 MG tablet Take 650 mg by mouth every 6 (six) hours as needed (Pain).  Marland Kitchen acetaminophen-codeine (TYLENOL #3) 300-30 MG per tablet 1/2 tab 3-4 times daily as needed for pain  . atorvastatin (LIPITOR) 80 MG tablet TAKE 1 TABLET ONCE A DAY  . calcium citrate-vitamin D (CITRACAL+D) 315-200 MG-UNIT per tablet Take 1 tablet by mouth daily.  . Cholecalciferol (VITAMIN D3) 2000 UNITS TABS Take 1 tablet by mouth daily.    . Multiple Vitamin (MULTIVITAMIN WITH MINERALS) TABS tablet Take 1 tablet by mouth daily.  Marland Kitchen omeprazole (PRILOSEC) 20 MG capsule Take 20 mg by mouth daily.  . polyvinyl alcohol (LIQUIFILM TEARS) 1.4 % ophthalmic solution Place 1 drop into both eyes as needed for dry eyes.  Marland Kitchen SYNTHROID 150 MCG tablet TAKE 1 TABLET DAILY  . valsartan-hydrochlorothiazide  (DIOVAN-HCT) 320-25 MG per tablet TAKE 1 TABLET DAILY  . zolpidem (AMBIEN) 10 MG tablet TAKE 1 TABLET AT BEDTIME AS NEEDED FOR SLEEP  . [DISCONTINUED] valACYclovir (VALTREX) 1000 MG tablet Take 1 tablet (1,000 mg total) by mouth 3 (three) times daily.    Review of Systems  Constitutional: Negative.   HENT: Negative.   Eyes: Negative.   Respiratory: Negative.   Cardiovascular: Negative.   Gastrointestinal: Negative.   Endocrine: Negative.   Genitourinary: Negative.   Musculoskeletal: Negative.   Skin: Negative.        shingles  Allergic/Immunologic: Negative.   Neurological: Negative.   Hematological: Negative.   Psychiatric/Behavioral: Negative.        Objective:   Physical Exam BP 147/74 mmHg  Pulse 69  Temp(Src) 97.3 F (36.3 C) (Oral)  Ht 5\' 5"  (1.651 m)  Wt 164 lb (74.39 kg)  BMI 27.29 kg/m2 The ear is more normal and the one blister that was on the pinnae has completely healed. The ear is still somewhat sensitive to touch but minimally so. There are no nodes in the anterior cervical chain and the ear canals are clear except for some wax. The chest was clear today and the heart had a regular rate and rhythm.       Assessment & Plan:  1. Shingles  Patient Instructions  No additional treatment is necessary The patient was reminded that if  she has a virus or something that she may hurt more around this area where the shingles was located.   Arrie Senate MD

## 2014-04-20 NOTE — Patient Instructions (Signed)
No additional treatment is necessary The patient was reminded that if she has a virus or something that she may hurt more around this area where the shingles was located.

## 2014-06-04 ENCOUNTER — Other Ambulatory Visit: Payer: Self-pay | Admitting: Family Medicine

## 2014-06-11 ENCOUNTER — Other Ambulatory Visit: Payer: Self-pay

## 2014-06-11 DIAGNOSIS — Z1231 Encounter for screening mammogram for malignant neoplasm of breast: Secondary | ICD-10-CM

## 2014-07-12 DIAGNOSIS — E119 Type 2 diabetes mellitus without complications: Secondary | ICD-10-CM | POA: Diagnosis not present

## 2014-07-12 DIAGNOSIS — H3531 Nonexudative age-related macular degeneration: Secondary | ICD-10-CM | POA: Diagnosis not present

## 2014-07-12 DIAGNOSIS — H43813 Vitreous degeneration, bilateral: Secondary | ICD-10-CM | POA: Diagnosis not present

## 2014-07-12 DIAGNOSIS — Z961 Presence of intraocular lens: Secondary | ICD-10-CM | POA: Diagnosis not present

## 2014-07-12 LAB — HM DIABETES EYE EXAM

## 2014-07-15 ENCOUNTER — Encounter: Payer: Self-pay | Admitting: *Deleted

## 2014-08-16 ENCOUNTER — Other Ambulatory Visit: Payer: Medicare Other

## 2014-08-16 DIAGNOSIS — Z1212 Encounter for screening for malignant neoplasm of rectum: Secondary | ICD-10-CM

## 2014-08-16 NOTE — Progress Notes (Signed)
Lab only 

## 2014-08-18 ENCOUNTER — Encounter: Payer: Self-pay | Admitting: Family Medicine

## 2014-08-18 ENCOUNTER — Ambulatory Visit (INDEPENDENT_AMBULATORY_CARE_PROVIDER_SITE_OTHER): Payer: Medicare Other | Admitting: Family Medicine

## 2014-08-18 VITALS — BP 134/68 | HR 69 | Temp 97.3°F | Ht 65.0 in | Wt 170.0 lb

## 2014-08-18 DIAGNOSIS — E039 Hypothyroidism, unspecified: Secondary | ICD-10-CM | POA: Diagnosis not present

## 2014-08-18 DIAGNOSIS — M25551 Pain in right hip: Secondary | ICD-10-CM

## 2014-08-18 DIAGNOSIS — E559 Vitamin D deficiency, unspecified: Secondary | ICD-10-CM | POA: Diagnosis not present

## 2014-08-18 DIAGNOSIS — R739 Hyperglycemia, unspecified: Secondary | ICD-10-CM

## 2014-08-18 DIAGNOSIS — G629 Polyneuropathy, unspecified: Secondary | ICD-10-CM

## 2014-08-18 DIAGNOSIS — R7309 Other abnormal glucose: Secondary | ICD-10-CM | POA: Diagnosis not present

## 2014-08-18 DIAGNOSIS — M791 Myalgia, unspecified site: Secondary | ICD-10-CM

## 2014-08-18 DIAGNOSIS — G4700 Insomnia, unspecified: Secondary | ICD-10-CM

## 2014-08-18 DIAGNOSIS — D696 Thrombocytopenia, unspecified: Secondary | ICD-10-CM

## 2014-08-18 DIAGNOSIS — I10 Essential (primary) hypertension: Secondary | ICD-10-CM

## 2014-08-18 DIAGNOSIS — E785 Hyperlipidemia, unspecified: Secondary | ICD-10-CM

## 2014-08-18 DIAGNOSIS — R109 Unspecified abdominal pain: Secondary | ICD-10-CM | POA: Diagnosis not present

## 2014-08-18 DIAGNOSIS — M25552 Pain in left hip: Secondary | ICD-10-CM | POA: Diagnosis not present

## 2014-08-18 DIAGNOSIS — F5104 Psychophysiologic insomnia: Secondary | ICD-10-CM

## 2014-08-18 LAB — POCT CBC
GRANULOCYTE PERCENT: 63.1 % (ref 37–80)
HEMATOCRIT: 41.1 % (ref 37.7–47.9)
Hemoglobin: 13.4 g/dL (ref 12.2–16.2)
Lymph, poc: 3.2 (ref 0.6–3.4)
MCH, POC: 30.3 pg (ref 27–31.2)
MCHC: 32.5 g/dL (ref 31.8–35.4)
MCV: 93.1 fL (ref 80–97)
MPV: 8.5 fL (ref 0–99.8)
PLATELET COUNT, POC: 126 10*3/uL — AB (ref 142–424)
POC GRANULOCYTE: 6.3 (ref 2–6.9)
POC LYMPH PERCENT: 31.8 %L (ref 10–50)
RBC: 4.41 M/uL (ref 4.04–5.48)
RDW, POC: 13 %
WBC: 10 10*3/uL (ref 4.6–10.2)

## 2014-08-18 LAB — FECAL OCCULT BLOOD, IMMUNOCHEMICAL: Fecal Occult Bld: NEGATIVE

## 2014-08-18 MED ORDER — ZOLPIDEM TARTRATE 10 MG PO TABS: 10.0000 mg | ORAL_TABLET | Freq: Every evening | ORAL | Status: DC | PRN

## 2014-08-18 NOTE — Progress Notes (Signed)
Subjective:    Patient ID: [name], female    DOB: January 04, 1926, 81 y.o.   MRN: [id number]  HPI   The patient comes in today with complaints of bilateral hip and thigh pain. She is concerned that some of this may be related to her statin drug use. Looking back through her records she has a long history of degenerative disc disease in the back and most likely the pain in her hips is coming from her back. She is also complaining of some congestion and sneezing and cough. She does have a history of inhalant allergies. She is using nasal saline, Flonase and has Mucinex at home to take as needed. She denies fever or purulence. She does also complain of abdominal pain but thinks this is mostly from scar tissue from multiple surgeries. She denies any diarrhea nausea vomiting or heartburn. She has not seen any blood in the stool. She does have plans to get her mammogram soon and will have a follow-up visit with the surgeon after this.  Patient Active Problem List   Diagnosis Date Noted  . Insomnia 11/24/2013  . Impingement syndrome of left shoulder 09/18/2013  . S/P arthroscopy of shoulder 09/18/2013  . Low back pain 03/17/2013  . Neuropathy 03/17/2013  . Metabolic syndrome 28/36/6294  . Overweight (BMI 25.0-29.9) 03/17/2013  . Vitamin D deficiency 12/01/2012  . Chronic insomnia 12/01/2012  . Fibrocystic breast changes 05/21/2011  . Essential hypertension   . Hyperlipidemia   . Peptic ulcer disease   . ITP (idiopathic thrombocytopenic purpura)   . Rhinitis   . Elevated blood sugar   . Hypothyroidism   . Goiter   . Colon polyps   . Adhesion of abdominal wall   . Esophageal stricture    Outpatient Encounter Prescriptions as of 08/18/2014  Medication Sig  . acetaminophen (TYLENOL) 325 MG tablet Take 650 mg by mouth every 6 (six) hours as needed (Pain).  Marland Kitchen atorvastatin (LIPITOR) 80 MG tablet TAKE 1 TABLET ONCE A DAY  . calcium citrate-vitamin D (CITRACAL+D) 315-200 MG-UNIT per tablet  Take 1 tablet by mouth daily.  . Cholecalciferol (VITAMIN D3) 2000 UNITS TABS Take 1 tablet by mouth daily.    . Multiple Vitamin (MULTIVITAMIN WITH MINERALS) TABS tablet Take 1 tablet by mouth daily.  Marland Kitchen omeprazole (PRILOSEC) 20 MG capsule Take 20 mg by mouth daily.  . polyvinyl alcohol (LIQUIFILM TEARS) 1.4 % ophthalmic solution Place 1 drop into both eyes as needed for dry eyes.  Marland Kitchen SYNTHROID 150 MCG tablet TAKE 1 TABLET DAILY  . valsartan-hydrochlorothiazide (DIOVAN-HCT) 320-25 MG per tablet TAKE 1 TABLET DAILY  . zolpidem (AMBIEN) 10 MG tablet Take 1 tablet (10 mg total) by mouth at bedtime as needed. for sleep  . [DISCONTINUED] acetaminophen-codeine (TYLENOL #3) 300-30 MG per tablet 1/2 tab 3-4 times daily as needed for pain  . [DISCONTINUED] zolpidem (AMBIEN) 10 MG tablet TAKE 1 TABLET AT BEDTIME AS NEEDED FOR SLEEP   No facility-administered encounter medications on file as of 08/18/2014.       Review of Systems  Constitutional: Negative.   HENT: Positive for congestion and sneezing.   Eyes: Negative.   Respiratory: Positive for cough (dry intermittent cough). Negative for chest tightness and wheezing.   Cardiovascular: Negative for chest pain, palpitations and leg swelling.  Gastrointestinal: Positive for abdominal pain (chronic upper abd pain due scar tissue). Negative for vomiting, diarrhea, constipation and blood in stool.  Endocrine: Negative.   Genitourinary: Negative.   Musculoskeletal: Positive  for myalgias (bilateral thigh pain and at times the pain is in the entire leg) and arthralgias (bilateral hip pain).  Allergic/Immunologic: Positive for environmental allergies. Food allergies: grass.  Neurological: Negative.   Hematological: Negative.   Psychiatric/Behavioral: Negative.        Difficulty sleeping but takes Ambien to help        Objective:   Physical Exam  Constitutional: She is oriented to person, place, and time. She appears well-developed and well-nourished.  No distress.  Elderly, pleasant and alert  HENT:  Head: Normocephalic and atraumatic.  Mouth/Throat: Oropharynx is clear and moist.  The throat is clear without drainage and there is some slight nasal congestion bilaterally and slight cerumen buildup in both ear canals  Eyes: Conjunctivae and EOM are normal. Pupils are equal, round, and reactive to light. Right eye exhibits no discharge. Left eye exhibits no discharge. No scleral icterus.  The patient recently had an eye exam and was told everything was stable with her eyes.  Neck: Normal range of motion. Neck supple. No thyromegaly present.  Cardiovascular: Normal rate, regular rhythm, normal heart sounds and intact distal pulses.   No murmur heard. At 72/m  Pulmonary/Chest: Effort normal and breath sounds normal. No respiratory distress. She has no wheezes. She has no rales. She exhibits no tenderness.  The patient has a dry cough and lungs were clear anteriorly and posteriorly  Abdominal: Soft. Bowel sounds are normal. She exhibits no mass. There is tenderness. There is no rebound and no guarding.  Slight epigastric tenderness and generalized abdominal tenderness to palpation with slightly increased bowel sounds  Musculoskeletal: Normal range of motion. She exhibits no edema or tenderness.  Lymphadenopathy:    She has no cervical adenopathy.  Neurological: She is alert and oriented to person, place, and time. She has normal reflexes. No cranial nerve deficit.  Skin: Skin is warm and dry. No rash noted.  Psychiatric: She has a normal mood and affect. Her behavior is normal. Judgment and thought content normal.  Nursing note and vitals reviewed.    BP 134/68 mmHg  Pulse 69  Temp(Src) 97.3 F (36.3 C) (Oral)  Ht _0  (1.651 m)  Wt 170 lb (77.111 kg)  BMI 28.29 kg/m2      Assessment & Plan:  1. Hypothyroidism, unspecified hypothyroidism type -Continue current treatment pending results of lab work - Thyroid Panel With TSH  2.  Essential hypertension -Blood pressure is under good control today and patient should continue with current treatment - POCT CBC - BMP8+EGFR  3. Vitamin D deficiency -She should continue with her vitamin D replacement pending results of lab work - Vit D  25 hydroxy (rtn osteoporosis monitoring)  4. Neuropathy -She will follow-up with orthopedic surgeon if she continues to have problems with her back and hips and thighs as a lot of this discomfort is most likely neuropathy from degenerative disc disease  5. Hyperlipidemia -Cholesterol numbers have been good in the past but with concerns about the hip and back pain she will hold the atorvastatin for 1 month to see if this relieves her discomfort and we'll restart the medication in one month. - NMR, lipoprofile - Hepatic function panel  6. Chronic insomnia -Her Ambien will be refilled today.  7. Elevated blood sugar -We will recheck this again today to make sure that her blood sugar is stable.  8. Myalgia -She will continue to take Tylenol as needed for pain and she will hold the atorvastatin for 1 month -  CK  9. Bilateral hip pain -If the pain in her hips continues she will call the orthopedic surgeon back and make arrange to see him again. - DG Lumbar Spine 2-3 Views; Future  10. Abdominal pain, unspecified abdominal location -She should continue to drink plenty of fluids and if the pain continues she will call us back and we will arrange to get an ultrasound of her abdomen and pelvis.  Meds ordered this encounter  Medications  . zolpidem (AMBIEN) 10 MG tablet    Sig: Take 1 tablet (10 mg total) by mouth at bedtime as needed. for sleep    Dispense:  30 tablet    Refill:  1   Patient Instructions                                 Continue current medications. Continue good therapeutic lifestyle changes which include good diet and exercise. Fall precautions discussed with patient. If an FOBT was given today- please  return it to our front desk. If you are over 42 years old - you may need Prevnar 61 or the adult Pneumonia vaccine. Keep appointment for mammogram.   After your visit with [place] today you will receive a survey in the mail or online from [employer] regarding your care with [place]. Please take a moment to fill this out. Your feedback is very important to [place] as you can help [place] better understand your patient needs as well as improve your experience and satisfaction. WE CARE ABOUT YOU!!!     Medicare Annual Wellness Visit  [facility] and the medical providers at [facility] strive to bring you the best medical care.  In doing so we not only want to address your current medical conditions and concerns but also to detect new conditions early and prevent illness, disease and health-related problems.    Medicare offers a yearly Wellness Visit which allows our clinical staff to assess your need for preventative services including immunizations, lifestyle education, counseling to decrease risk of preventable diseases and screening for fall risk and other medical concerns.    This visit is provided free of charge (no copay) for all Medicare recipients. The clinical pharmacists at [facility] have begun to conduct these Wellness Visits which will also include a thorough review of all your medications.    As you primary medical provider recommend that you make an appointment for your Annual Wellness Visit if you have not done so already this year.  You may set up this appointment before you leave today or you may call back ([phone]) and schedule an appointment.  Please make sure when you call that you mention that you are scheduling your Annual Wellness Visit with the clinical pharmacist so that the appointment may be made for the proper length of time.    Continue with Flonase, Benadryl, nasal saline and Mucinex over-the-counter for cough and congestion----continue to  drink plenty of fluids Hold the atorvastatin for one month to see if this makes any difference with your back pain and hip pain If you continue to have problems with your back and hips, please call your orthopedic surgeon as he may want to consider giving you some more injections to help control the pain Continue to be careful and do not put yourself at risk for falling If the abdominal pain does not get better with lots of fluids over the next few days,  please call us back and we will arrange to get an ultrasound of your abdomen and pelvis   Arrie Senate MD

## 2014-08-18 NOTE — Patient Instructions (Addendum)
           Continue current medications. Continue good therapeutic lifestyle changes which include good diet and exercise. Fall precautions discussed with patient. If an FOBT was given today- please return it to our front desk. If you are over 79 years old - you may need Prevnar 32 or the adult Pneumonia vaccine. Keep appointment for mammogram.   After your visit with [place] today you will receive a survey in the mail or online from [employer] regarding your care with [place]. Please take a moment to fill this out. Your feedback is very important to [place] as you can help [place] better understand your patient needs as well as improve your experience and satisfaction. WE CARE ABOUT YOU!!!     Medicare Annual Wellness Visit  [facility] and the medical providers at [facility] strive to bring you the best medical care.  In doing so we not only want to address your current medical conditions and concerns but also to detect new conditions early and prevent illness, disease and health-related problems.    Medicare offers a yearly Wellness Visit which allows our clinical staff to assess your need for preventative services including immunizations, lifestyle education, counseling to decrease risk of preventable diseases and screening for fall risk and other medical concerns.    This visit is provided free of charge (no copay) for all Medicare recipients. The clinical pharmacists at [facility] have begun to conduct these Wellness Visits which will also include a thorough review of all your medications.    As you primary medical provider recommend that you make an appointment for your Annual Wellness Visit if you have not done so already this year.  You may set up this appointment before you leave today or you may call back ([phone]) and schedule an appointment.  Please make sure when you call that you mention that you are scheduling your Annual  Wellness Visit with the clinical pharmacist so that the appointment may be made for the proper length of time.    Continue with Flonase, Benadryl, nasal saline and Mucinex over-the-counter for cough and congestion----continue to drink plenty of fluids Hold the atorvastatin for one month to see if this makes any difference with your back pain and hip pain If you continue to have problems with your back and hips, please call your orthopedic surgeon as he may want to consider giving you some more injections to help control the pain Continue to be careful and do not put yourself at risk for falling If the abdominal pain does not get better with lots of fluids over the next few days, please call us back and we will arrange to get an ultrasound of your abdomen and pelvis

## 2014-08-19 ENCOUNTER — Telehealth: Payer: Self-pay | Admitting: *Deleted

## 2014-08-19 ENCOUNTER — Other Ambulatory Visit: Payer: Self-pay | Admitting: *Deleted

## 2014-08-19 DIAGNOSIS — E039 Hypothyroidism, unspecified: Secondary | ICD-10-CM

## 2014-08-19 DIAGNOSIS — M791 Myalgia, unspecified site: Secondary | ICD-10-CM

## 2014-08-19 LAB — BMP8+EGFR
BUN / CREAT RATIO: 13 (ref 11–26)
BUN: 12 mg/dL (ref 8–27)
CALCIUM: 9.7 mg/dL (ref 8.7–10.3)
CHLORIDE: 100 mmol/L (ref 97–108)
CO2: 28 mmol/L (ref 18–29)
CREATININE: 0.9 mg/dL (ref 0.57–1.00)
GFR calc non Af Amer: 58 mL/min/{1.73_m2} — ABNORMAL LOW (ref >59)
GFR, EST AFRICAN AMERICAN: 66 mL/min/{1.73_m2} (ref >59)
Glucose: 141 mg/dL — ABNORMAL HIGH (ref 65–99)
Potassium: 3.8 mmol/L (ref 3.5–5.2)
Sodium: 143 mmol/L (ref 134–144)

## 2014-08-19 LAB — NMR, LIPOPROFILE
CHOLESTEROL: 140 mg/dL (ref 100–199)
HDL Cholesterol by NMR: 57 mg/dL (ref >39)
HDL PARTICLE NUMBER: 39.3 umol/L (ref >=30.5)
LDL Particle Number: 711 nmol/L (ref <1000)
LDL SIZE: 20.6 nm (ref >20.5)
LDL-C: 55 mg/dL (ref 0–99)
LP-IR Score: 36 (ref <=45)
Small LDL Particle Number: 474 nmol/L (ref <=527)
Triglycerides by NMR: 141 mg/dL (ref 0–149)

## 2014-08-19 LAB — THYROID PANEL WITH TSH
Free Thyroxine Index: 2 (ref 1.2–4.9)
T3 Uptake Ratio: 27 % (ref 24–39)
T4, Total: 7.4 ug/dL (ref 4.5–12.0)
TSH: 9.85 u[IU]/mL — ABNORMAL HIGH (ref 0.450–4.500)

## 2014-08-19 LAB — HEPATIC FUNCTION PANEL
ALBUMIN: 4.1 g/dL (ref 3.5–4.7)
ALT: 13 IU/L (ref 0–32)
AST: 23 IU/L (ref 0–40)
Alkaline Phosphatase: 74 IU/L (ref 39–117)
BILIRUBIN TOTAL: 0.6 mg/dL (ref 0.0–1.2)
Bilirubin, Direct: 0.16 mg/dL (ref 0.00–0.40)
Total Protein: 7.2 g/dL (ref 6.0–8.5)

## 2014-08-19 LAB — POCT GLYCOSYLATED HEMOGLOBIN (HGB A1C): Hemoglobin A1C: 6.4

## 2014-08-19 LAB — VITAMIN D 25 HYDROXY (VIT D DEFICIENCY, FRACTURES): VIT D 25 HYDROXY: 35.1 ng/mL (ref 30.0–100.0)

## 2014-08-19 LAB — CK: CK TOTAL: 282 U/L — AB (ref 24–173)

## 2014-08-19 NOTE — Telephone Encounter (Signed)
Pt notified of results Verbalizes understanding appt scheduled with [facility]

## 2014-08-19 NOTE — Addendum Note (Signed)
Addended by: Earlene Plater on: 08/19/2014 09:38 AM   Modules accepted: Orders

## 2014-08-19 NOTE — Telephone Encounter (Signed)
-----   Message from Chipper Herb, MD sent at 08/19/2014 10:09 AM EDT ----- The hemoglobin A1c is 6.4% and anything greater than this means diabetes. The patient should be scheduled for an appointment with the clinical pharmacist to discuss better management of diabetes and possible initiation of metformin 500 once daily

## 2014-08-23 ENCOUNTER — Ambulatory Visit
Admission: RE | Admit: 2014-08-23 | Discharge: 2014-08-23 | Disposition: A | Discharge status: Home / Self Care | Payer: Medicare Other | Source: Ambulatory Visit

## 2014-08-23 DIAGNOSIS — N6011 Diffuse cystic mastopathy of right breast: Secondary | ICD-10-CM | POA: Diagnosis not present

## 2014-08-23 DIAGNOSIS — Z1231 Encounter for screening mammogram for malignant neoplasm of breast: Secondary | ICD-10-CM

## 2014-08-23 DIAGNOSIS — N6012 Diffuse cystic mastopathy of left breast: Secondary | ICD-10-CM | POA: Diagnosis not present

## 2014-09-10 ENCOUNTER — Other Ambulatory Visit: Payer: Self-pay | Admitting: Family Medicine

## 2014-09-20 ENCOUNTER — Ambulatory Visit: Payer: Medicare Other

## 2014-09-22 ENCOUNTER — Ambulatory Visit: Payer: BLUE CROSS/BLUE SHIELD

## 2014-10-05 ENCOUNTER — Other Ambulatory Visit: Payer: Self-pay | Admitting: Family Medicine

## 2014-10-05 ENCOUNTER — Other Ambulatory Visit (INDEPENDENT_AMBULATORY_CARE_PROVIDER_SITE_OTHER): Payer: Medicare Other

## 2014-10-05 DIAGNOSIS — Z780 Asymptomatic menopausal state: Secondary | ICD-10-CM

## 2014-10-05 DIAGNOSIS — E038 Other specified hypothyroidism: Secondary | ICD-10-CM

## 2014-10-05 NOTE — Progress Notes (Signed)
Lab only 

## 2014-10-06 LAB — THYROID PANEL WITH TSH
Free Thyroxine Index: 2.2 (ref 1.2–4.9)
T3 Uptake Ratio: 28 % (ref 24–39)
T4, Total: 8 ug/dL (ref 4.5–12.0)
TSH: 4.63 u[IU]/mL — AB (ref 0.450–4.500)

## 2014-10-21 ENCOUNTER — Other Ambulatory Visit: Payer: Self-pay | Admitting: Family Medicine

## 2014-10-22 NOTE — Telephone Encounter (Signed)
Last seen 08/18/14 DWM  If approved route to nurse to call into [facility]

## 2014-11-01 ENCOUNTER — Other Ambulatory Visit: Payer: Self-pay | Admitting: Family Medicine

## 2014-11-27 ENCOUNTER — Other Ambulatory Visit: Payer: Self-pay | Admitting: Family Medicine

## 2014-11-29 NOTE — Telephone Encounter (Signed)
Last seen 08/18/14  DWM  If approved route to nurse to call into [facility]

## 2014-12-08 DIAGNOSIS — M25551 Pain in right hip: Secondary | ICD-10-CM | POA: Diagnosis not present

## 2014-12-08 DIAGNOSIS — M4806 Spinal stenosis, lumbar region: Secondary | ICD-10-CM | POA: Diagnosis not present

## 2014-12-21 DIAGNOSIS — M4806 Spinal stenosis, lumbar region: Secondary | ICD-10-CM | POA: Diagnosis not present

## 2014-12-24 ENCOUNTER — Encounter: Payer: Self-pay | Admitting: Family Medicine

## 2014-12-24 ENCOUNTER — Ambulatory Visit (INDEPENDENT_AMBULATORY_CARE_PROVIDER_SITE_OTHER): Payer: Medicare Other | Admitting: Family Medicine

## 2014-12-24 VITALS — BP 132/60 | HR 57 | Temp 97.8°F | Ht 65.0 in | Wt 164.0 lb

## 2014-12-24 DIAGNOSIS — I10 Essential (primary) hypertension: Secondary | ICD-10-CM | POA: Diagnosis not present

## 2014-12-24 DIAGNOSIS — E559 Vitamin D deficiency, unspecified: Secondary | ICD-10-CM

## 2014-12-24 DIAGNOSIS — Z23 Encounter for immunization: Secondary | ICD-10-CM

## 2014-12-24 DIAGNOSIS — E785 Hyperlipidemia, unspecified: Secondary | ICD-10-CM

## 2014-12-24 DIAGNOSIS — G629 Polyneuropathy, unspecified: Secondary | ICD-10-CM | POA: Diagnosis not present

## 2014-12-24 DIAGNOSIS — M5441 Lumbago with sciatica, right side: Secondary | ICD-10-CM | POA: Diagnosis not present

## 2014-12-24 DIAGNOSIS — D696 Thrombocytopenia, unspecified: Secondary | ICD-10-CM | POA: Diagnosis not present

## 2014-12-24 DIAGNOSIS — E039 Hypothyroidism, unspecified: Secondary | ICD-10-CM

## 2014-12-24 MED ORDER — ZOLPIDEM TARTRATE 10 MG PO TABS: 10.0000 mg | ORAL_TABLET | Freq: Every evening | ORAL | Status: DC | PRN

## 2014-12-24 NOTE — Progress Notes (Signed)
Subjective:    Patient ID: [name], female    DOB: [birth year], 81 y.o.   MRN: [id number]  HPI Pt here for follow up and management of chronic medical problems which includes hypertension, hyperlipidemia, and hypothyroid. She is taking medications regularly. Patient today does complain of some back pain and abdominal pain and blood in the stool. She does have a history of diverticulosis and diverticulitis. The blood in the stool that she had was just on the toilet tissue and only on a couple of occasions. She is also has trouble with her back. She is requesting refills on her Ambien. She is due to get a DEXA scan but we will schedule this at some point in the future. She will get her lab work today and a flu shot today. She is requesting that we refill the Synthroid with generic. The patient denies chest pain or shortness of breath. She is not having trouble with swallowing heartburn indigestion nausea vomiting or diarrhea. She is passing her water without problems. Her biggest issues once again are the back pain.      Patient Active Problem List   Diagnosis Date Noted  . Thrombocytopenia ([place]) 08/18/2014  . Insomnia 11/24/2013  . Impingement syndrome of left shoulder 09/18/2013  . S/P arthroscopy of shoulder 09/18/2013  . Low back pain 03/17/2013  . Neuropathy ([place]) 03/17/2013  . Metabolic syndrome 95/32/0233  . Overweight (BMI 25.0-29.9) 03/17/2013  . Vitamin D deficiency 12/01/2012  . Chronic insomnia 12/01/2012  . Fibrocystic breast changes 05/21/2011  . Essential hypertension   . Hyperlipidemia   . Peptic ulcer disease   . ITP (idiopathic thrombocytopenic purpura)   . Rhinitis   . Elevated blood sugar   . Hypothyroidism   . Goiter   . Colon polyps   . Adhesion of abdominal wall   . Esophageal stricture    Outpatient Encounter Prescriptions as of 12/24/2014  Medication Sig  . acetaminophen (TYLENOL) 325 MG tablet Take 650 mg by mouth every 6 (six) hours as needed  (Pain).  Marland Kitchen atorvastatin (LIPITOR) 80 MG tablet TAKE 1 TABLET ONCE A DAY  . calcium citrate-vitamin D (CITRACAL+D) 315-200 MG-UNIT per tablet Take 1 tablet by mouth daily.  . Cholecalciferol (VITAMIN D3) 2000 UNITS TABS Take 1 tablet by mouth daily.    . Multiple Vitamin (MULTIVITAMIN WITH MINERALS) TABS tablet Take 1 tablet by mouth daily.  Marland Kitchen omeprazole (PRILOSEC) 20 MG capsule Take 20 mg by mouth daily.  . polyvinyl alcohol (LIQUIFILM TEARS) 1.4 % ophthalmic solution Place 1 drop into both eyes as needed for dry eyes.  Marland Kitchen SYNTHROID 150 MCG tablet TAKE 1 TABLET DAILY  . valsartan-hydrochlorothiazide (DIOVAN-HCT) 320-25 MG per tablet TAKE 1 TABLET DAILY  . zolpidem (AMBIEN) 10 MG tablet TAKE 1 TABLET AT BEDTIME AS NEEDED FOR SLEEP   No facility-administered encounter medications on file as of 12/24/2014.      Review of Systems  Constitutional: Negative.   HENT: Negative.   Eyes: Negative.   Respiratory: Negative.   Cardiovascular: Negative.   Gastrointestinal: Positive for abdominal pain and blood in stool.  Endocrine: Negative.   Genitourinary: Negative.   Musculoskeletal: Positive for back pain.  Skin: Negative.   Allergic/Immunologic: Negative.   Neurological: Negative.   Hematological: Negative.   Psychiatric/Behavioral: Negative.        Objective:   Physical Exam  Constitutional: She is oriented to person, place, and time. She appears well-developed and well-nourished. No distress.  HENT:  Head: Normocephalic and  atraumatic.  Left Ear: External ear normal.  Nose: Nose normal.  Mouth/Throat: Oropharynx is clear and moist.  The skin over the right external ear and has an irritated area and this is where she is recently had the shingles. The patient was reassured about this.  Eyes: Conjunctivae and EOM are normal. Pupils are equal, round, and reactive to light. Right eye exhibits no discharge. Left eye exhibits no discharge. No scleral icterus.  Neck: Normal range of  motion. Neck supple. No thyromegaly present.  Cardiovascular: Normal rate, regular rhythm, normal heart sounds and intact distal pulses.  Exam reveals no gallop and no friction rub.   No murmur heard. At 72/m  Pulmonary/Chest: Effort normal and breath sounds normal. No respiratory distress. She has no wheezes. She has no rales. She exhibits no tenderness.  Clear anteriorly and posteriorly  Abdominal: Soft. Bowel sounds are normal. She exhibits no mass. There is no tenderness. There is no rebound and no guarding.  The abdomen was soft without tenderness or masses. The left lower quadrant and the right lower quadrant were negative for tenderness.  Musculoskeletal: She exhibits tenderness. She exhibits no edema.  The patient's range of motion was somewhat limited especially with laying on the table and arising from the table. She also had increased pain with leg raising on the right side.  Lymphadenopathy:    She has no cervical adenopathy.  Neurological: She is alert and oriented to person, place, and time. She has normal reflexes. No cranial nerve deficit.  Skin: Skin is warm and dry. No rash noted.  Psychiatric: She has a normal mood and affect. Her behavior is normal. Judgment and thought content normal.  Nursing note and vitals reviewed.  BP 132/60 mmHg  Pulse 57  Temp(Src) 97.8 F (36.6 C) (Oral)  Ht 5' 5"  (1.651 m)  Wt 164 lb (74.39 kg)  BMI 27.29 kg/m2        Assessment & Plan:  1. Hypothyroidism, unspecified hypothyroidism type -At the patient's request we will change her to levothyroxine 1 she completes her current Synthroid prescription. This was done to save money. We will make sure that we check another thyroid profile at her next visit. - CBC with Differential/Platelet - Thyroid Panel With TSH  2. Essential hypertension -Blood pressure is good today she will continue with current treatment - BMP8+EGFR - CBC with Differential/Platelet - Hepatic function panel  3.  Vitamin D deficiency -Continue with current treatment pending results of lab work - CBC with Differential/Platelet - Vit D  25 hydroxy (rtn osteoporosis monitoring)  4. Hyperlipidemia -Continue with current treatment pending results of lab work - CBC with Differential/Platelet - NMR, lipoprofile  5. Thrombocytopenia ([place]) -The patient has had no bruising or bleeding issues other than a couple episodes of bright red blood per rectum recently. - CBC with Differential/Platelet  6. Neuropathy ([place]) -The patient will continue to follow-up with the orthopedic surgeon  7. 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 zolpidem (AMBIEN) 10 MG tablet    Sig: Take 1 tablet (10 mg total) by mouth at bedtime as needed. for sleep    Dispense:  30 tablet    Refill:  4   Patient Instructions                       Medicare Annual Wellness Visit  [facility] and the medical providers at [facility] strive to bring you the best medical care.  In doing so we not only want to address your current medical conditions and concerns but also to detect new conditions early and prevent illness, disease and health-related problems.    Medicare offers a yearly Wellness Visit which allows our clinical staff to assess your need for preventative services including immunizations, lifestyle education, counseling to decrease risk of preventable diseases and screening for fall risk and other medical concerns.    This visit is provided free of charge (no copay) for all Medicare recipients. The clinical pharmacists at [facility] have begun to conduct these Wellness Visits which will also include a thorough review of all your medications.    As you primary medical provider recommend that you make an appointment for your Annual Wellness Visit if you have not done so already this  year.  You may set up this appointment before you leave today or you may call back [phone]) and schedule an appointment.  Please make sure when you call that you mention that you are scheduling your Annual Wellness Visit with the clinical pharmacist so that the appointment may be made for the proper length of time.     Continue current medications. Continue good therapeutic lifestyle changes which include good diet and exercise. Fall precautions discussed with patient. If an FOBT was given today- please return it to our front desk. If you are over 2 years old - you may need Prevnar 73 or the adult Pneumonia vaccine.  **Flu shots will be available soon--- please call and schedule a FLU-CLINIC appointment**  After your visit with [place] today you will receive a survey in the mail or online from [employer] regarding your care with [place]. Please take a moment to fill this out. Your feedback is very important to [place] as you can help [place] better understand your patient needs as well as improve your experience and satisfaction. WE CARE ABOUT YOU!!!    The patient will be changed to the generic levothyroxine because of the expense of the Synthroid. We will check a thyroid profile today and check another one at the next visit to make sure that her levels are being maintained on the generic. She should continue to follow-up with the orthopedic surgeon regarding her back problems and pain This winter she should drink plenty of fluids and stay well hydrated and continue to be careful and did not put yourself at risk for falling   Arrie Senate MD

## 2014-12-24 NOTE — Patient Instructions (Addendum)
Medicare Annual Wellness Visit  [facility] and the medical providers at [facility] strive to bring you the best medical care.  In doing so we not only want to address your current medical conditions and concerns but also to detect new conditions early and prevent illness, disease and health-related problems.    Medicare offers a yearly Wellness Visit which allows our clinical staff to assess your need for preventative services including immunizations, lifestyle education, counseling to decrease risk of preventable diseases and screening for fall risk and other medical concerns.    This visit is provided free of charge (no copay) for all Medicare recipients. The clinical pharmacists at [facility] have begun to conduct these Wellness Visits which will also include a thorough review of all your medications.    As you primary medical provider recommend that you make an appointment for your Annual Wellness Visit if you have not done so already this year.  You may set up this appointment before you leave today or you may call back ([phone]) and schedule an appointment.  Please make sure when you call that you mention that you are scheduling your Annual Wellness Visit with the clinical pharmacist so that the appointment may be made for the proper length of time.     Continue current medications. Continue good therapeutic lifestyle changes which include good diet and exercise. Fall precautions discussed with patient. If an FOBT was given today- please return it to our front desk. If you are over 83 years old - you may need Prevnar 20 or the adult Pneumonia vaccine.  **Flu shots will be available soon--- please call and schedule a FLU-CLINIC appointment**  After your visit with [place] today you will receive a survey in the mail or online from [employer] regarding your care with [place]. Please take a moment to fill this out. Your feedback is  very important to [place] as you can help [place] better understand your patient needs as well as improve your experience and satisfaction. WE CARE ABOUT YOU!!!    The patient will be changed to the generic levothyroxine because of the expense of the Synthroid. We will check a thyroid profile today and check another one at the next visit to make sure that her levels are being maintained on the generic. She should continue to follow-up with the orthopedic surgeon regarding her back problems and pain This winter she should drink plenty of fluids and stay well hydrated and continue to be careful and did not put yourself at risk for falling

## 2014-12-25 LAB — CBC WITH DIFFERENTIAL/PLATELET
BASOS ABS: 0.1 10*3/uL (ref 0.0–0.2)
Basos: 0 %
EOS (ABSOLUTE): 0.2 10*3/uL (ref 0.0–0.4)
EOS: 2 %
HEMOGLOBIN: 13 g/dL (ref 11.1–15.9)
Hematocrit: 39.3 % (ref 34.0–46.6)
Immature Grans (Abs): 0 10*3/uL (ref 0.0–0.1)
Immature Granulocytes: 0 %
LYMPHS ABS: 4.3 10*3/uL — AB (ref 0.7–3.1)
Lymphs: 37 %
MCH: 30.7 pg (ref 26.6–33.0)
MCHC: 33.1 g/dL (ref 31.5–35.7)
MCV: 93 fL (ref 79–97)
MONOCYTES: 6 %
Monocytes Absolute: 0.7 10*3/uL (ref 0.1–0.9)
Neutrophils Absolute: 6.3 10*3/uL (ref 1.4–7.0)
Neutrophils: 55 %
PLATELETS: 155 10*3/uL (ref 150–379)
RBC: 4.24 x10E6/uL (ref 3.77–5.28)
RDW: 13.5 % (ref 12.3–15.4)
WBC: 11.5 10*3/uL — AB (ref 3.4–10.8)

## 2014-12-25 LAB — BMP8+EGFR
BUN / CREAT RATIO: 18 (ref 11–26)
BUN: 17 mg/dL (ref 8–27)
CO2: 26 mmol/L (ref 18–29)
CREATININE: 0.92 mg/dL (ref 0.57–1.00)
Calcium: 9.4 mg/dL (ref 8.7–10.3)
Chloride: 98 mmol/L (ref 97–108)
GFR calc non Af Amer: 56 mL/min/{1.73_m2} — ABNORMAL LOW (ref >59)
GFR, EST AFRICAN AMERICAN: 65 mL/min/{1.73_m2} (ref >59)
GLUCOSE: 113 mg/dL — AB (ref 65–99)
Potassium: 3.3 mmol/L — ABNORMAL LOW (ref 3.5–5.2)
Sodium: 142 mmol/L (ref 134–144)

## 2014-12-25 LAB — THYROID PANEL WITH TSH
FREE THYROXINE INDEX: 2.1 (ref 1.2–4.9)
T3 UPTAKE RATIO: 29 % (ref 24–39)
T4 TOTAL: 7.4 ug/dL (ref 4.5–12.0)
TSH: 12.68 u[IU]/mL — AB (ref 0.450–4.500)

## 2014-12-25 LAB — NMR, LIPOPROFILE
Cholesterol: 142 mg/dL (ref 100–199)
HDL Cholesterol by NMR: 56 mg/dL (ref >39)
HDL Particle Number: 37.3 umol/L (ref >=30.5)
LDL PARTICLE NUMBER: 635 nmol/L (ref <1000)
LDL SIZE: 20.3 nm (ref >20.5)
LDL-C: 49 mg/dL (ref 0–99)
LP-IR SCORE: 25 (ref <=45)
Small LDL Particle Number: 446 nmol/L (ref <=527)
Triglycerides by NMR: 184 mg/dL — ABNORMAL HIGH (ref 0–149)

## 2014-12-25 LAB — HEPATIC FUNCTION PANEL
ALBUMIN: 4.1 g/dL (ref 3.5–4.7)
ALK PHOS: 67 IU/L (ref 39–117)
ALT: 11 IU/L (ref 0–32)
AST: 26 IU/L (ref 0–40)
Bilirubin Total: 0.7 mg/dL (ref 0.0–1.2)
Bilirubin, Direct: 0.19 mg/dL (ref 0.00–0.40)
TOTAL PROTEIN: 7.1 g/dL (ref 6.0–8.5)

## 2014-12-25 LAB — VITAMIN D 25 HYDROXY (VIT D DEFICIENCY, FRACTURES): VIT D 25 HYDROXY: 33.4 ng/mL (ref 30.0–100.0)

## 2014-12-28 ENCOUNTER — Telehealth: Payer: Self-pay | Admitting: *Deleted

## 2014-12-28 DIAGNOSIS — E876 Hypokalemia: Secondary | ICD-10-CM

## 2014-12-28 MED ORDER — LEVOTHYROXINE SODIUM 25 MCG PO TABS: ORAL_TABLET | ORAL | Status: DC

## 2014-12-28 NOTE — Telephone Encounter (Signed)
Patient aware of results.

## 2014-12-28 NOTE — Telephone Encounter (Signed)
-----   Message from Zannie Cove, LPN sent at 92/44/6286 12:56 PM EDT -----   ----- Message -----    From: Chipper Herb, MD    Sent: 12/25/2014   9:07 PM      To: Cleda Clarks Bullins, LPN  The blood sugar is elevated at 113. The creatinine, the most important kidney function test is within normal limits. The potassium is 3.3 and this is slightly decreased. The remainder of the electrolytes are within normal limits. Because of the low potassium the patient should have her BMP repeated in the next week at her convenience and she does not have to be fasting.++++++ The CBC has a white blood cell count that is slightly elevated and we will continue to monitor this. The hemoglobin is good at 13.0 and the platelet count is adequate. All liver function tests are within normal limits The TSH is elevated at 12.680. This means that she is not getting enough thyroid medicine. First, please confirm with the patient the current thyroid dose that she is taking. If it is 150 g we will need to increase this to a higher dose. Please add 25 g one half pill daily to the current dosage if it is 150 g. This would make 162.5 g daily. She should continue this dose and should have a repeat thyroid profile 6 weeks later.+++++ this is important please make sure that she understands that she is now to be taking 162.5 g daily. We should use the brand name medicine to get the correct dosage in place before we changed to the generic medication.++++++ All cholesterol numbers with advanced lipid testing are excellent and at goal except the triglycerides are elevated and we will continue to monitor this. Hitting the thyroid under better control will also help the cholesterol numbers. She should continue with her current cholesterol treatment and with as aggressive therapeutic lifestyle changes as possible. The vitamin D level is within normal limits and she should continue with her current dose of vitamin D at this time. This  appears to be 2000 units daily.

## 2014-12-28 NOTE — Telephone Encounter (Signed)
-----   Message from Zannie Cove, LPN sent at 58/85/0277 12:56 PM EDT -----   ----- Message -----    From: Chipper Herb, MD    Sent: 12/25/2014   9:07 PM      To: Cleda Clarks Bullins, LPN  The blood sugar is elevated at 113. The creatinine, the most important kidney function test is within normal limits. The potassium is 3.3 and this is slightly decreased. The remainder of the electrolytes are within normal limits. Because of the low potassium the patient should have her BMP repeated in the next week at her convenience and she does not have to be fasting.++++++ The CBC has a white blood cell count that is slightly elevated and we will continue to monitor this. The hemoglobin is good at 13.0 and the platelet count is adequate. All liver function tests are within normal limits The TSH is elevated at 12.680. This means that she is not getting enough thyroid medicine. First, please confirm with the patient the current thyroid dose that she is taking. If it is 150 g we will need to increase this to a higher dose. Please add 25 g one half pill daily to the current dosage if it is 150 g. This would make 162.5 g daily. She should continue this dose and should have a repeat thyroid profile 6 weeks later.+++++ this is important please make sure that she understands that she is now to be taking 162.5 g daily. We should use the brand name medicine to get the correct dosage in place before we changed to the generic medication.++++++ All cholesterol numbers with advanced lipid testing are excellent and at goal except the triglycerides are elevated and we will continue to monitor this. Hitting the thyroid under better control will also help the cholesterol numbers. She should continue with her current cholesterol treatment and with as aggressive therapeutic lifestyle changes as possible. The vitamin D level is within normal limits and she should continue with her current dose of vitamin D at this time. This  appears to be 2000 units daily.

## 2014-12-30 DIAGNOSIS — M4806 Spinal stenosis, lumbar region: Secondary | ICD-10-CM | POA: Diagnosis not present

## 2015-01-01 ENCOUNTER — Other Ambulatory Visit: Payer: Self-pay | Admitting: Specialist

## 2015-01-01 DIAGNOSIS — M545 Low back pain: Secondary | ICD-10-CM

## 2015-01-12 ENCOUNTER — Other Ambulatory Visit (INDEPENDENT_AMBULATORY_CARE_PROVIDER_SITE_OTHER): Payer: Medicare Other

## 2015-01-12 DIAGNOSIS — E875 Hyperkalemia: Secondary | ICD-10-CM

## 2015-01-13 LAB — BMP8+EGFR
BUN / CREAT RATIO: 17 (ref 11–26)
BUN: 14 mg/dL (ref 8–27)
CHLORIDE: 99 mmol/L (ref 97–106)
CO2: 26 mmol/L (ref 18–29)
Calcium: 9.5 mg/dL (ref 8.7–10.3)
Creatinine, Ser: 0.84 mg/dL (ref 0.57–1.00)
GFR calc non Af Amer: 63 mL/min/{1.73_m2} (ref >59)
GFR, EST AFRICAN AMERICAN: 72 mL/min/{1.73_m2} (ref >59)
Glucose: 126 mg/dL — ABNORMAL HIGH (ref 65–99)
POTASSIUM: 3.8 mmol/L (ref 3.5–5.2)
SODIUM: 143 mmol/L (ref 136–144)

## 2015-01-19 ENCOUNTER — Inpatient Hospital Stay: Admission: RE | Admit: 2015-01-19 | Payer: Medicare Other | Source: Ambulatory Visit

## 2015-02-02 ENCOUNTER — Ambulatory Visit
Admission: RE | Admit: 2015-02-02 | Discharge: 2015-02-02 | Disposition: A | Discharge status: Home / Self Care | Payer: Medicare Other | Source: Ambulatory Visit | Attending: Specialist | Admitting: Specialist

## 2015-02-02 DIAGNOSIS — M4806 Spinal stenosis, lumbar region: Secondary | ICD-10-CM | POA: Diagnosis not present

## 2015-02-02 DIAGNOSIS — M545 Low back pain: Secondary | ICD-10-CM

## 2015-02-02 MED ORDER — GADOBENATE DIMEGLUMINE 529 MG/ML IV SOLN
15.0000 mL | Freq: Once | INTRAVENOUS | Status: AC | PRN
  Administered 2015-02-02: 15 mL via INTRAVENOUS

## 2015-02-10 DIAGNOSIS — M4806 Spinal stenosis, lumbar region: Secondary | ICD-10-CM | POA: Diagnosis not present

## 2015-02-10 DIAGNOSIS — M25551 Pain in right hip: Secondary | ICD-10-CM | POA: Diagnosis not present

## 2015-03-14 ENCOUNTER — Other Ambulatory Visit: Payer: Self-pay | Admitting: Family Medicine

## 2015-03-26 ENCOUNTER — Other Ambulatory Visit: Payer: Self-pay | Admitting: Family Medicine

## 2015-04-21 ENCOUNTER — Encounter: Payer: Self-pay | Admitting: Cardiology

## 2015-04-21 ENCOUNTER — Ambulatory Visit (INDEPENDENT_AMBULATORY_CARE_PROVIDER_SITE_OTHER): Payer: Medicare Other | Admitting: Cardiology

## 2015-04-21 VITALS — BP 160/60 | HR 66 | Ht 65.0 in | Wt 162.1 lb

## 2015-04-21 DIAGNOSIS — R9431 Abnormal electrocardiogram [ECG] [EKG]: Secondary | ICD-10-CM

## 2015-04-21 NOTE — Patient Instructions (Signed)
Your physician recommends that you schedule a follow-up appointment in: as needed  

## 2015-04-21 NOTE — Progress Notes (Signed)
Cardiology Office Note   Date:  04/21/2015   ID:  [name], DOB 12-17-1925, MRN [id number]  PCP:  Redge Gainer, MD  Cardiologist:   Minus Breeding, MD   Chief Complaint  Patient presents with  . Pre-op Exam      History of Present Illness: [name] is a 82 y.o. female who presents for evaluation of an abnormal EKG. She's been back surgery and preoperative clearance is requested. She has had no prior cardiac history. However, she's found to have a right bundle branch block on her EKG. I was able to review previous EKG from 2015 and she had right bundle branch block at that time. She has severe back pain and is requiring surgery for management of this. She does get around doing some activities minimally at home. She walks with a cane or a walker. With her levels of activity she never had any chest pressure, neck or arm discomfort. He does not have any palpitations, presyncope or syncope. She has no shortness of breath, PND or orthopnea. She's had no weight gain or edema.   Past Medical History  Diagnosis Date  . Breast cyst   . Unspecified essential hypertension   . Other and unspecified hyperlipidemia   . Peptic ulcer disease   . ITP (idiopathic thrombocytopenic purpura)     not a problem now.  . Rhinitis   . Elevated blood sugar   . Unspecified hypothyroidism   . Goiter   . Colon polyps   . Adhesion of abdominal wall     "continues to have abdominal pain pain intermittent right abdominal side. "scar tissue"  . Esophageal stricture     "occ. problems with swallowing water"    Past Surgical History  Procedure Laterality Date  . Abdominal hysterectomy    . Cataract surgery Bilateral   . Thyroid surgery      Total "goiter removal"  . 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 Breast surgery      x3 biopsy  . Back surgery      x1 Lumbar, x2 cervical anterior and posterior .  Marland Kitchen Hemorrhoid surgery       Current Outpatient  Prescriptions  Medication Sig Dispense Refill  . acetaminophen (TYLENOL) 325 MG tablet Take 650 mg by mouth every 6 (six) hours as needed (Pain).    Marland Kitchen atorvastatin (LIPITOR) 80 MG tablet TAKE 1 TABLET ONCE A DAY 30 tablet 2  . calcium citrate-vitamin D (CITRACAL+D) 315-200 MG-UNIT per tablet Take 1 tablet by mouth daily.    . Cholecalciferol (VITAMIN D3) 2000 UNITS TABS Take 1 tablet by mouth daily.      Marland Kitchen HYDROcodone-acetaminophen (NORCO) 7.5-325 MG tablet Take 1 tablet by mouth 2 (two) times daily. Take 1 tab by mouth twice a day  4  . levothyroxine (LEVOTHROID) 25 MCG tablet Take 1/2 tablet (12.57mcg) daily.  Along with 120mcg of Synthroid 30 tablet 0  . Multiple Vitamin (MULTIVITAMIN WITH MINERALS) TABS tablet Take 1 tablet by mouth daily.    Marland Kitchen omeprazole (PRILOSEC) 20 MG capsule Take 20 mg by mouth daily.    . polyvinyl alcohol (LIQUIFILM TEARS) 1.4 % ophthalmic solution Place 1 drop into both eyes as needed for dry eyes.    Marland Kitchen SYNTHROID 150 MCG tablet TAKE 1 TABLET DAILY 90 tablet 1  . valsartan-hydrochlorothiazide (DIOVAN-HCT) 320-25 MG tablet TAKE 1 TABLET DAILY 30 tablet 2  . zolpidem (AMBIEN) 10 MG tablet Take  1 tablet (10 mg total) by mouth at bedtime as needed. for sleep 30 tablet 4   No current facility-administered medications for this visit.    Allergies:   Hydrocodone and Prednisone    Social History:  The patient  reports that she has been passively smoking.  She started smoking about 70 years ago. She does not have any smokeless tobacco history on file. She reports that she does not drink alcohol or use illicit drugs.   Family History:  The patient's family history includes Emphysema in her sister; Heart disease in her mother; Lung cancer in her father.    ROS:  Please see the history of present illness.   Otherwise, review of systems are positive for none.   All other systems are reviewed and negative.    PHYSICAL EXAM: VS:  BP 160/60 mmHg  Pulse 66  Ht 5\' 5"  (1.651  m)  Wt 162 lb 1 oz (73.511 kg)  BMI 26.97 kg/m2 , BMI Body mass index is 26.97 kg/(m^2). GENERAL:  Well appearing HEENT:  Pupils equal round and reactive, fundi not visualized, oral mucosa unremarkable NECK:  No jugular venous distention, waveform within normal limits, carotid upstroke brisk and symmetric, no bruits, no thyromegaly LYMPHATICS:  No cervical, inguinal adenopathy LUNGS:  Clear to auscultation bilaterally BACK:  No CVA tenderness CHEST:  Unremarkable HEART:  PMI not displaced or sustained,S1 and S2 within normal limits, no S3, no S4, no clicks, no rubs, no murmurs ABD:  Flat, positive bowel sounds normal in frequency in pitch, no bruits, no rebound, no guarding, no midline pulsatile mass, no hepatomegaly, no splenomegaly EXT:  2 plus pulses throughout, no edema, no cyanosis no clubbing SKIN:  No rashes no nodules NEURO:  Cranial nerves II through XII grossly intact, motor grossly intact throughout PSYCH:  Cognitively intact, oriented to person place and time    EKG:  EKG is ordered today. The ekg ordered today demonstrates  sinus rhythm, rate 67, right bundle branch block, left anterior fascicular block, premature ectopic complex.   Recent Labs: 08/18/2014: Hemoglobin 13.4 12/24/2014: ALT 11; Platelets 155; TSH 12.680* 01/12/2015: BUN 14; Creatinine, Ser 0.84; Potassium 3.8; Sodium 143    Lipid Panel    Component Value Date/Time   CHOL 142 12/24/2014 1111   CHOL 130 07/13/2013 0905   CHOL 122 07/24/2012 0902   TRIG 184* 12/24/2014 1111   TRIG 80 07/13/2013 0905   TRIG 89 07/24/2012 0902   HDL 56 12/24/2014 1111   HDL 68 07/13/2013 0905   HDL 47 07/24/2012 0902   CHOLHDL 1.9 07/13/2013 0905   LDLCALC 53 11/24/2013 0931   LDLCALC 46 07/13/2013 0905   LDLCALC 57 07/24/2012 0902      Wt Readings from Last 3 Encounters:  04/21/15 162 lb 1 oz (73.511 kg)  12/24/14 164 lb (74.39 kg)  08/18/14 170 lb (77.111 kg)      Other studies Reviewed: Additional  studies/ records that were reviewed today include: Old EKG. Review of the above records demonstrates:  Please see elsewhere in the note.     ASSESSMENT AND PLAN:  ABNORMAL EKG:  She has a chronic right bundle branch block with left anterior fascicular block but no symptoms. No change in therapy is indicated.  PREOP:  The patient has no high-risk historical or physical findings. Surgery she is going to have is not high risk from a surgical standpoint. She does have a reduced functional capacity because of age and back pain. However, there are  no clear contraindications to surgery.  Based on ACC/AHA guidelines, the patient would be at acceptable risk for the planned procedure without further cardiovascular testing. .   Current medicines are reviewed at length with the patient today.  The patient does not have concerns regarding medicines.  The following changes have been made:  no change  Labs/ tests ordered today include:   Orders Placed This Encounter  Procedures  . EKG 12-Lead     Disposition:   FU with me as needed.      Signed, Minus Breeding, MD  04/21/2015 4:01 PM    [facility]

## 2015-05-11 ENCOUNTER — Ambulatory Visit (HOSPITAL_COMMUNITY)
Admission: RE | Admit: 2015-05-11 | Discharge: 2015-05-11 | Disposition: A | Discharge status: Home / Self Care | Payer: Medicare Other | Source: Ambulatory Visit | Attending: Surgery | Admitting: Surgery

## 2015-05-11 ENCOUNTER — Encounter (HOSPITAL_COMMUNITY): Payer: Self-pay

## 2015-05-11 ENCOUNTER — Encounter (HOSPITAL_COMMUNITY)
Admission: RE | Admit: 2015-05-11 | Discharge: 2015-05-11 | Disposition: A | Discharge status: Home / Self Care | Payer: Medicare Other | Source: Ambulatory Visit | Attending: Specialist | Admitting: Specialist

## 2015-05-11 ENCOUNTER — Ambulatory Visit: Payer: Medicare Other | Admitting: Family Medicine

## 2015-05-11 DIAGNOSIS — R918 Other nonspecific abnormal finding of lung field: Secondary | ICD-10-CM | POA: Insufficient documentation

## 2015-05-11 DIAGNOSIS — Z01818 Encounter for other preprocedural examination: Secondary | ICD-10-CM

## 2015-05-11 DIAGNOSIS — J984 Other disorders of lung: Secondary | ICD-10-CM | POA: Insufficient documentation

## 2015-05-11 DIAGNOSIS — I10 Essential (primary) hypertension: Secondary | ICD-10-CM | POA: Diagnosis not present

## 2015-05-11 DIAGNOSIS — Z01812 Encounter for preprocedural laboratory examination: Secondary | ICD-10-CM | POA: Insufficient documentation

## 2015-05-11 DIAGNOSIS — D696 Thrombocytopenia, unspecified: Secondary | ICD-10-CM | POA: Insufficient documentation

## 2015-05-11 DIAGNOSIS — Z79899 Other long term (current) drug therapy: Secondary | ICD-10-CM | POA: Insufficient documentation

## 2015-05-11 DIAGNOSIS — E785 Hyperlipidemia, unspecified: Secondary | ICD-10-CM | POA: Diagnosis not present

## 2015-05-11 DIAGNOSIS — M519 Unspecified thoracic, thoracolumbar and lumbosacral intervertebral disc disorder: Secondary | ICD-10-CM | POA: Diagnosis not present

## 2015-05-11 DIAGNOSIS — M25551 Pain in right hip: Secondary | ICD-10-CM | POA: Diagnosis not present

## 2015-05-11 DIAGNOSIS — M4806 Spinal stenosis, lumbar region: Secondary | ICD-10-CM | POA: Diagnosis not present

## 2015-05-11 HISTORY — DX: Personal history of other medical treatment: Z92.89

## 2015-05-11 HISTORY — DX: Complete loss of teeth, unspecified cause, unspecified class: Z97.2

## 2015-05-11 HISTORY — DX: Gastrointestinal hemorrhage, unspecified: K92.2

## 2015-05-11 HISTORY — DX: Complete loss of teeth, unspecified cause, unspecified class: K08.109

## 2015-05-11 HISTORY — DX: Unspecified osteoarthritis, unspecified site: M19.90

## 2015-05-11 HISTORY — DX: Personal history of other diseases of the digestive system: Z87.19

## 2015-05-11 HISTORY — DX: Unspecified asthma, uncomplicated: J45.909

## 2015-05-11 LAB — CBC
HCT: 43.1 % (ref 36.0–46.0)
HEMOGLOBIN: 13.3 g/dL (ref 12.0–15.0)
MCH: 30.2 pg (ref 26.0–34.0)
MCHC: 30.9 g/dL (ref 30.0–36.0)
MCV: 98 fL (ref 78.0–100.0)
Platelets: 136 10*3/uL — ABNORMAL LOW (ref 150–400)
RBC: 4.4 MIL/uL (ref 3.87–5.11)
RDW: 13.6 % (ref 11.5–15.5)
WBC: 12.5 10*3/uL — ABNORMAL HIGH (ref 4.0–10.5)

## 2015-05-11 LAB — URINE MICROSCOPIC-ADD ON

## 2015-05-11 LAB — COMPREHENSIVE METABOLIC PANEL
ALK PHOS: 61 U/L (ref 38–126)
ALT: 11 U/L — AB (ref 14–54)
AST: 25 U/L (ref 15–41)
Albumin: 3.7 g/dL (ref 3.5–5.0)
Anion gap: 13 (ref 5–15)
BUN: 14 mg/dL (ref 6–20)
CALCIUM: 9.7 mg/dL (ref 8.9–10.3)
CO2: 26 mmol/L (ref 22–32)
CREATININE: 1.13 mg/dL — AB (ref 0.44–1.00)
Chloride: 105 mmol/L (ref 101–111)
GFR calc non Af Amer: 42 mL/min — ABNORMAL LOW (ref >60)
GFR, EST AFRICAN AMERICAN: 49 mL/min — AB (ref >60)
GLUCOSE: 94 mg/dL (ref 65–99)
Potassium: 3.6 mmol/L (ref 3.5–5.1)
SODIUM: 144 mmol/L (ref 135–145)
Total Bilirubin: 0.9 mg/dL (ref 0.3–1.2)
Total Protein: 7.9 g/dL (ref 6.5–8.1)

## 2015-05-11 LAB — PROTIME-INR
INR: 1.03 (ref 0.00–1.49)
Prothrombin Time: 13.7 seconds (ref 11.6–15.2)

## 2015-05-11 LAB — URINALYSIS, ROUTINE W REFLEX MICROSCOPIC
Bilirubin Urine: NEGATIVE
GLUCOSE, UA: NEGATIVE mg/dL
Ketones, ur: NEGATIVE mg/dL
Nitrite: NEGATIVE
PH: 5 (ref 5.0–8.0)
Protein, ur: NEGATIVE mg/dL
SPECIFIC GRAVITY, URINE: 1.017 (ref 1.005–1.030)

## 2015-05-11 LAB — SURGICAL PCR SCREEN
MRSA, PCR: NEGATIVE
Staphylococcus aureus: NEGATIVE

## 2015-05-11 LAB — APTT: aPTT: 27 seconds (ref 24–37)

## 2015-05-11 NOTE — Pre-Procedure Instructions (Signed)
[name]  05/11/2015      [place], [address] [place] [address] Phone: [phone] Fax: [phone]  [place], [address] [place] [address] Phone: [phone] Fax: [phone]    Your procedure is scheduled on Monday March 6th.  Report to [facility] Admitting at 530 A.M.  Call this number if you have problems the morning of surgery:  [phone]   Remember:  Do not eat food or drink liquids after midnight.  Take these medicines the morning of surgery with A SIP OF WATER acetaminophen (tylenol) if needed, hydrocodone (norco) if needed, levothyroxine (levothroid) and synthroid, omeprazole (prilosec)  STOP: ALL Vitamins, Supplements, Effient and Herbal Medications, Fish Oils, Aspirins, NSAIDs (Nonsteroidal Anti-inflammatories such as Ibuprofen, Aleve, or Advil), and Goody's/BC Powders today until after surgery as directed by your physician.    Do not wear jewelry, make-up or nail polish.  Do not wear lotions, powders, or perfumes.  You may wear deodorant.  Do not shave 48 hours prior to surgery.    Do not bring valuables to the hospital.  [facility] is not responsible for any belongings or valuables.  Contacts, dentures or bridgework may not be worn into surgery.  Leave your suitcase in the car.  After surgery it may be brought to your room.  For patients admitted to the hospital, discharge time will be determined by your treatment team.  Patients discharged the day of surgery will not be allowed to drive home.        Preparing for Surgery at [facility]  Before surgery, you can play an important role.  Because skin is not sterile, your skin needs to be as free of germs as possible.  You can reduce the number of germs on your skin by washing with CHG (chlorahexidine gluconate) Soap before surgery.  CHG is an antiseptic cleaner with kills germs and  bonds with the skin to continue killing germs even after washing.   Please do not use if you have an allergy to CHG or antibacterial soaps.  If your skin becomes reddened/irritated stop using the CHG.  Do not shave (including legs and underarms) for at least 48 hours prior to first CHG shower.  It is okay to shave your face.  Please follow these instructions carefully:  1. Shower with CHG Soap the night before surgery and the morning of Surgery. 2. If you choose to wash your hair, wash your hair first as usual with your normal shampoo. 3. After you shampoo, rinse your hair and body thoroughly to remove the Shampoo. 4. Use CHG as you would any other liquid soap. You can apply chg directly to the skin and wash gently with scrungie or a clean washcloth. 5. Apply the CHG Soap to your body ONLY FROM THE NECK DOWN. Do not use on open wounds or open sores. Avoid contact with your eyes, ears, mouth and genitals (private parts). Wash genitals (private parts) with your normal soap. 6. Wash thoroughly, paying special attention to the area where your surgery will be performed. 7. Thoroughly rinse your body with warm water from the neck down. 8. DO NOT shower/wash with your normal soap after using and rinsing off the CHG Soap. 9. Pat yourself dry with a clean towel.  10. Wear clean pajamas.  11. Place clean sheets on your bed the night of your first shower and do  not sleep with pets.  Day of Surgery  Do not apply any lotions/deodorants the morning of surgery. Please wear clean clothes to the hospital/surgery center.   Please read over the following fact sheets that you were given. Pain Booklet, Coughing and Deep Breathing, MRSA Information and Surgical Site Infection Prevention

## 2015-05-13 MED ORDER — CEFAZOLIN SODIUM-DEXTROSE 2-3 GM-% IV SOLR
2.0000 g | INTRAVENOUS | Status: AC
  Administered 2015-05-16: 2 g via INTRAVENOUS
  Filled 2015-05-13: qty 50

## 2015-05-13 MED ORDER — CHLORHEXIDINE GLUCONATE 4 % EX LIQD: 60.0000 mL | Freq: Once | CUTANEOUS | Status: DC

## 2015-05-15 NOTE — H&P (Signed)
[name] is an 82 y.o. female.    CHIEF COMPLAINT:  Low back pain.   HISTORY OF PRESENT ILLNESS:  The patient is an 82 year old female.  She is seen today, with her last visit being over a month prior to her being scheduled for intervention surgically for a disc protrusion along the right side at the L3-4 level.  She has a spondylolisthesis at this segment that is grade 1.  With flexion and extension, the spondylolisthesis does not seem to worsen.  She is experiencing some pain into her right buttock with some radiation along the right thigh, but no below the knee.  Pain is worse with sitting, bending and stooping.  She reports she is not really interested in going to a nursing home after surgery.  Instead, she would rather go home with some physical therapy, which I think is okay, especially if she has somebody at home to help her and she reports her son would help her.  She has concerns about using some kind of blood thinner following her back surgery, as she did have this following her knee surgery and I have explained to [name] that generally we do not cover patients for DVT prophylaxis following lumbar or cervical spine surgery, mainly because the patient's are up and walking and this decreases her tendency to develop a DVT.     Past Medical History  Diagnosis Date  . Breast cyst   . Unspecified essential hypertension   . Other and unspecified hyperlipidemia   . Peptic ulcer disease   . ITP (idiopathic thrombocytopenic purpura)     not a problem now.  . Rhinitis   . Elevated blood sugar   . Unspecified hypothyroidism   . Goiter   . Colon polyps   . Adhesion of abdominal wall     "continues to have abdominal pain pain intermittent right abdominal side. "scar tissue"  . Esophageal stricture     "occ. problems with swallowing water"  . Bronchitis, asthmatic     'bronchitis that will go into an asthma like attack but haven't had it in years'  . History of hiatal hernia      repaired over 20 years ago  . Arthritis   . Full dentures   . History of blood transfusion   . GI bleed     Past Surgical History  Procedure Laterality Date  . Abdominal hysterectomy    . Cataract surgery Bilateral   . Thyroid surgery      Total "goiter removal"  . 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 Breast surgery      x3 biopsy  . Back surgery      x1 Lumbar, x2 cervical anterior and posterior .  Marland Kitchen Hemorrhoid surgery    . Hernia repair    . Cholecystectomy    . Appendectomy    . Joint replacement    . Total knee arthroplasty Right 2010  . Eye surgery Bilateral 2007    cataracts     Family History  Problem Relation Age of Onset  . Lung cancer Father   . Heart disease Mother     Unkown.  Died age 50  . Emphysema Sister    Social History:  reports that she has been passively smoking.  She started smoking about 70 years ago. She does not have any smokeless tobacco history on file. She reports that she does not drink alcohol or use illicit drugs.  Allergies:  Allergies  Allergen Reactions  . Prednisone Other (See Comments)    Bloated. Unable to determine if intolerance with water retention, or Possible Allergic response with swelling.  Marland Kitchen Hydrocodone Other (See Comments)    Felt like "I didn't like myself or nobody" 09-08-13 States "can take in low doses"    No prescriptions prior to admission    No results found for this or any previous visit (from the past 48 hour(s)). No results found.  Review of Systems  Constitutional: Negative.   HENT: Negative.   Eyes: Negative.   Respiratory: Negative.   Cardiovascular: Negative.   Gastrointestinal: Negative.   Musculoskeletal: Positive for back pain.  Skin: Negative.   Psychiatric/Behavioral: Negative.     There were no vitals taken for this visit. Physical Exam  Constitutional: She is oriented to person, place, and time. No distress.  HENT:  Head: Atraumatic.  Eyes: EOM are normal.   Neck: Normal range of motion.  Cardiovascular: Normal rate.   Respiratory: No respiratory distress.  GI: She exhibits no distension.  Musculoskeletal: She exhibits tenderness.  Neurological: She is alert and oriented to person, place, and time.  Skin: Skin is warm and dry.  Psychiatric: She has a normal mood and affect.  She is weak in right foot dorsiflexion.  It is 4/5.  Right knee extension strength is 5-/5.  Dangling straight leg raise is negative.  Reverse straight leg raise is positive.     RADIOGRAPHS:  Her studies are reviewed.  They show grade 1 spondylolisthesis at L3-4.  There is some triangulation at the thecal sac due to ligamentum flavum hypertrophy in both the right and left side.  Most of the disc protrusion, though, is superior to the L3-4 level extending over the back of the L3 vertebral body within the neural foramen at this level.    ASSESSMENT:  Right-sided L3-4 HNP with spondylolisthesis at this L3-4 segment.  She does not have a great deal of motion with flexion and extension, and at age 24 I am concerned about morbidity associated with prolonged surgery and fusion in an 82 year old female.   PLAN:  I have recommended that we go with a decompression alone in order to relieve pain in her legs and improve her standing and walking tolerance.  I have explained to [name] that we could go and decompress and fuse this segment, but the risks are greater with fusion.  I think that at this point decompression alone should be adequate.  If we have to go back and fuse, we would do it at a later date, but I do think that it is worth considering an operation that may provide relief of pain into her legs without necessarily causing a great deal of risks with surgery for a prolonged period of time with blood loss concerns.  This surgery would be expected to last about 45 minutes to an hour with decompression primarily on the right side at L3-4, removing a portion of the ventral portion of the  spinous process to allow [place] to decompress across the midline to the opposite side and remove ligamentum flavum there.  The surgery would involve excision of the fragments that extend above the disc space along the right side, causing right-sided L3 and L4 nerve root compression.  Will go ahead and schedule her for surgery.  The risks and benefits of the surgery were discussed with this patient.  They include risks of infection, risks of bleeding and risks of neurologic compromise.  Lanae Crumbly,  PA-C 05/15/2015, 8:28 PM

## 2015-05-16 ENCOUNTER — Observation Stay (HOSPITAL_COMMUNITY)
Admission: RE | Admit: 2015-05-16 | Discharge: 2015-05-17 | Disposition: A | Discharge status: Home Health | Payer: Medicare Other | Source: Ambulatory Visit | Attending: Specialist | Admitting: Specialist

## 2015-05-16 ENCOUNTER — Encounter (HOSPITAL_COMMUNITY): Payer: Self-pay | Admitting: *Deleted

## 2015-05-16 ENCOUNTER — Inpatient Hospital Stay (HOSPITAL_COMMUNITY): Payer: Medicare Other

## 2015-05-16 ENCOUNTER — Encounter (HOSPITAL_COMMUNITY)
Admission: RE | Disposition: A | Discharge status: Home Health | Payer: Self-pay | Source: Ambulatory Visit | Attending: Specialist

## 2015-05-16 ENCOUNTER — Other Ambulatory Visit: Payer: Self-pay | Admitting: Family Medicine

## 2015-05-16 ENCOUNTER — Inpatient Hospital Stay (HOSPITAL_COMMUNITY): Payer: Medicare Other | Admitting: Anesthesiology

## 2015-05-16 DIAGNOSIS — M5126 Other intervertebral disc displacement, lumbar region: Secondary | ICD-10-CM | POA: Diagnosis not present

## 2015-05-16 DIAGNOSIS — M5020 Other cervical disc displacement, unspecified cervical region: Secondary | ICD-10-CM | POA: Diagnosis present

## 2015-05-16 DIAGNOSIS — E785 Hyperlipidemia, unspecified: Secondary | ICD-10-CM | POA: Diagnosis not present

## 2015-05-16 DIAGNOSIS — Z79891 Long term (current) use of opiate analgesic: Secondary | ICD-10-CM | POA: Insufficient documentation

## 2015-05-16 DIAGNOSIS — M4316 Spondylolisthesis, lumbar region: Secondary | ICD-10-CM | POA: Insufficient documentation

## 2015-05-16 DIAGNOSIS — Z9889 Other specified postprocedural states: Secondary | ICD-10-CM | POA: Diagnosis not present

## 2015-05-16 DIAGNOSIS — E039 Hypothyroidism, unspecified: Secondary | ICD-10-CM | POA: Diagnosis not present

## 2015-05-16 DIAGNOSIS — M1990 Unspecified osteoarthritis, unspecified site: Secondary | ICD-10-CM | POA: Diagnosis not present

## 2015-05-16 DIAGNOSIS — M519 Unspecified thoracic, thoracolumbar and lumbosacral intervertebral disc disorder: Secondary | ICD-10-CM | POA: Diagnosis not present

## 2015-05-16 DIAGNOSIS — Z419 Encounter for procedure for purposes other than remedying health state, unspecified: Secondary | ICD-10-CM

## 2015-05-16 DIAGNOSIS — R531 Weakness: Secondary | ICD-10-CM | POA: Insufficient documentation

## 2015-05-16 DIAGNOSIS — I10 Essential (primary) hypertension: Secondary | ICD-10-CM | POA: Insufficient documentation

## 2015-05-16 DIAGNOSIS — Z79899 Other long term (current) drug therapy: Secondary | ICD-10-CM | POA: Diagnosis not present

## 2015-05-16 DIAGNOSIS — M4806 Spinal stenosis, lumbar region: Secondary | ICD-10-CM | POA: Diagnosis not present

## 2015-05-16 HISTORY — PX: LUMBAR LAMINECTOMY: SHX95

## 2015-05-16 SURGERY — MICRODISCECTOMY LUMBAR LAMINECTOMY
Anesthesia: General | Site: Back

## 2015-05-16 MED ORDER — PROMETHAZINE HCL 25 MG/ML IJ SOLN: 6.2500 mg | INTRAMUSCULAR | Status: DC | PRN

## 2015-05-16 MED ORDER — EPHEDRINE SULFATE 50 MG/ML IJ SOLN
INTRAMUSCULAR | Status: AC
  Filled 2015-05-16: qty 1

## 2015-05-16 MED ORDER — KETOROLAC TROMETHAMINE 30 MG/ML IJ SOLN
INTRAMUSCULAR | Status: AC
  Administered 2015-05-16: 30 mg via INTRAVENOUS
  Filled 2015-05-16: qty 1

## 2015-05-16 MED ORDER — METHOCARBAMOL 500 MG PO TABS
500.0000 mg | ORAL_TABLET | Freq: Four times a day (QID) | ORAL | Status: DC | PRN
  Administered 2015-05-16 – 2015-05-17 (×3): 500 mg via ORAL
  Filled 2015-05-16 (×3): qty 1

## 2015-05-16 MED ORDER — GLYCOPYRROLATE 0.2 MG/ML IJ SOLN
INTRAMUSCULAR | Status: AC
  Filled 2015-05-16: qty 1

## 2015-05-16 MED ORDER — BUPIVACAINE HCL 0.5 % IJ SOLN
INTRAMUSCULAR | Status: DC | PRN
  Administered 2015-05-16: 20 mL

## 2015-05-16 MED ORDER — CALCIUM CITRATE-VITAMIN D 315-200 MG-UNIT PO TABS: 1.0000 | ORAL_TABLET | Freq: Every day | ORAL | Status: DC

## 2015-05-16 MED ORDER — LACTATED RINGERS IV SOLN
INTRAVENOUS | Status: DC | PRN
  Administered 2015-05-16 (×2): via INTRAVENOUS

## 2015-05-16 MED ORDER — BUPIVACAINE HCL (PF) 0.5 % IJ SOLN
INTRAMUSCULAR | Status: AC
  Filled 2015-05-16: qty 30

## 2015-05-16 MED ORDER — MORPHINE SULFATE (PF) 2 MG/ML IV SOLN
1.0000 mg | INTRAVENOUS | Status: DC | PRN
  Administered 2015-05-16: 2 mg via INTRAVENOUS
  Filled 2015-05-16: qty 1

## 2015-05-16 MED ORDER — ACETAMINOPHEN 325 MG PO TABS: 650.0000 mg | ORAL_TABLET | ORAL | Status: DC | PRN

## 2015-05-16 MED ORDER — POLYVINYL ALCOHOL 1.4 % OP SOLN
1.0000 [drp] | OPHTHALMIC | Status: DC | PRN
  Filled 2015-05-16: qty 15

## 2015-05-16 MED ORDER — THROMBIN 20000 UNITS EX KIT
PACK | CUTANEOUS | Status: DC | PRN
  Administered 2015-05-16: 20 mL via TOPICAL

## 2015-05-16 MED ORDER — SODIUM CHLORIDE 0.45 % IV SOLN
INTRAVENOUS | Status: DC
  Administered 2015-05-16: 15:00:00 via INTRAVENOUS

## 2015-05-16 MED ORDER — FLEET ENEMA 7-19 GM/118ML RE ENEM: 1.0000 | ENEMA | Freq: Once | RECTAL | Status: DC | PRN

## 2015-05-16 MED ORDER — SODIUM CHLORIDE 0.9% FLUSH: 3.0000 mL | Freq: Two times a day (BID) | INTRAVENOUS | Status: DC

## 2015-05-16 MED ORDER — ADULT MULTIVITAMIN W/MINERALS CH
1.0000 | ORAL_TABLET | Freq: Every day | ORAL | Status: DC
  Administered 2015-05-17: 1 via ORAL
  Filled 2015-05-16: qty 1

## 2015-05-16 MED ORDER — ONDANSETRON HCL 4 MG/2ML IJ SOLN
INTRAMUSCULAR | Status: DC | PRN
  Administered 2015-05-16: 4 mg via INTRAVENOUS

## 2015-05-16 MED ORDER — CALCIUM CARBONATE-VITAMIN D 500-200 MG-UNIT PO TABS
1.0000 | ORAL_TABLET | Freq: Every day | ORAL | Status: DC
  Administered 2015-05-17: 1 via ORAL
  Filled 2015-05-16 (×2): qty 1

## 2015-05-16 MED ORDER — PHENOL 1.4 % MT LIQD: 1.0000 | OROMUCOSAL | Status: DC | PRN

## 2015-05-16 MED ORDER — ZOLPIDEM TARTRATE 5 MG PO TABS: 5.0000 mg | ORAL_TABLET | Freq: Every evening | ORAL | Status: DC | PRN

## 2015-05-16 MED ORDER — THROMBIN 20000 UNITS EX SOLR
CUTANEOUS | Status: AC
  Filled 2015-05-16: qty 20000

## 2015-05-16 MED ORDER — HYDROCHLOROTHIAZIDE 25 MG PO TABS
25.0000 mg | ORAL_TABLET | Freq: Every day | ORAL | Status: DC
  Administered 2015-05-17: 25 mg via ORAL
  Filled 2015-05-16 (×2): qty 1

## 2015-05-16 MED ORDER — ROCURONIUM BROMIDE 100 MG/10ML IV SOLN
INTRAVENOUS | Status: DC | PRN
  Administered 2015-05-16 (×2): 10 mg via INTRAVENOUS
  Administered 2015-05-16: 30 mg via INTRAVENOUS

## 2015-05-16 MED ORDER — LEVOTHYROXINE SODIUM 25 MCG PO TABS
162.5000 ug | ORAL_TABLET | Freq: Every day | ORAL | Status: DC
Start: 2015-05-17 — End: 2015-05-17

## 2015-05-16 MED ORDER — ESMOLOL HCL 100 MG/10ML IV SOLN
INTRAVENOUS | Status: DC | PRN
  Administered 2015-05-16 (×2): 20 mg via INTRAVENOUS

## 2015-05-16 MED ORDER — BUPIVACAINE LIPOSOME 1.3 % IJ SUSP
20.0000 mL | INTRAMUSCULAR | Status: AC
  Filled 2015-05-16: qty 20

## 2015-05-16 MED ORDER — ACETAMINOPHEN 650 MG RE SUPP: 650.0000 mg | RECTAL | Status: DC | PRN

## 2015-05-16 MED ORDER — LIDOCAINE HCL (CARDIAC) 20 MG/ML IV SOLN
INTRAVENOUS | Status: AC
  Filled 2015-05-16: qty 5

## 2015-05-16 MED ORDER — HYDROCODONE-ACETAMINOPHEN 5-325 MG PO TABS
1.0000 | ORAL_TABLET | ORAL | Status: DC | PRN
  Administered 2015-05-16 – 2015-05-17 (×5): 2 via ORAL
  Filled 2015-05-16 (×5): qty 2

## 2015-05-16 MED ORDER — SUGAMMADEX SODIUM 200 MG/2ML IV SOLN
INTRAVENOUS | Status: AC
  Filled 2015-05-16: qty 2

## 2015-05-16 MED ORDER — ROCURONIUM BROMIDE 50 MG/5ML IV SOLN
INTRAVENOUS | Status: AC
  Filled 2015-05-16: qty 1

## 2015-05-16 MED ORDER — METHOCARBAMOL 1000 MG/10ML IJ SOLN
500.0000 mg | Freq: Four times a day (QID) | INTRAVENOUS | Status: DC | PRN
  Filled 2015-05-16: qty 5

## 2015-05-16 MED ORDER — PHENYLEPHRINE HCL 10 MG/ML IJ SOLN
10.0000 mg | INTRAVENOUS | Status: DC | PRN
  Administered 2015-05-16: 10 ug/min via INTRAVENOUS

## 2015-05-16 MED ORDER — CEFAZOLIN SODIUM 1-5 GM-% IV SOLN
1.0000 g | Freq: Three times a day (TID) | INTRAVENOUS | Status: AC
  Administered 2015-05-16 (×2): 1 g via INTRAVENOUS
  Filled 2015-05-16 (×2): qty 50

## 2015-05-16 MED ORDER — PHENYLEPHRINE 40 MCG/ML (10ML) SYRINGE FOR IV PUSH (FOR BLOOD PRESSURE SUPPORT)
PREFILLED_SYRINGE | INTRAVENOUS | Status: AC
  Filled 2015-05-16: qty 10

## 2015-05-16 MED ORDER — 0.9 % SODIUM CHLORIDE (POUR BTL) OPTIME
TOPICAL | Status: DC | PRN
  Administered 2015-05-16: 1000 mL

## 2015-05-16 MED ORDER — ONDANSETRON HCL 4 MG/2ML IJ SOLN: 4.0000 mg | INTRAMUSCULAR | Status: DC | PRN

## 2015-05-16 MED ORDER — ATORVASTATIN CALCIUM 80 MG PO TABS
80.0000 mg | ORAL_TABLET | Freq: Every day | ORAL | Status: DC
  Administered 2015-05-16 – 2015-05-17 (×2): 80 mg via ORAL
  Filled 2015-05-16 (×2): qty 1

## 2015-05-16 MED ORDER — FENTANYL CITRATE (PF) 250 MCG/5ML IJ SOLN
INTRAMUSCULAR | Status: AC
  Filled 2015-05-16: qty 5

## 2015-05-16 MED ORDER — SUCCINYLCHOLINE CHLORIDE 20 MG/ML IJ SOLN
INTRAMUSCULAR | Status: AC
  Filled 2015-05-16: qty 1

## 2015-05-16 MED ORDER — IRBESARTAN 300 MG PO TABS
300.0000 mg | ORAL_TABLET | Freq: Every day | ORAL | Status: DC
  Administered 2015-05-17: 300 mg via ORAL
  Filled 2015-05-16: qty 1
  Filled 2015-05-16: qty 2

## 2015-05-16 MED ORDER — ESMOLOL HCL 100 MG/10ML IV SOLN: INTRAVENOUS | Status: DC | PRN

## 2015-05-16 MED ORDER — BUPIVACAINE LIPOSOME 1.3 % IJ SUSP
INTRAMUSCULAR | Status: DC | PRN
  Administered 2015-05-16: 20 mL

## 2015-05-16 MED ORDER — PANTOPRAZOLE SODIUM 40 MG PO TBEC
40.0000 mg | DELAYED_RELEASE_TABLET | Freq: Every day | ORAL | Status: DC
Start: 2015-05-16 — End: 2015-05-17
  Administered 2015-05-16 – 2015-05-17 (×2): 40 mg via ORAL
  Filled 2015-05-16 (×2): qty 1

## 2015-05-16 MED ORDER — FENTANYL CITRATE (PF) 100 MCG/2ML IJ SOLN
INTRAMUSCULAR | Status: DC | PRN
  Administered 2015-05-16 (×5): 50 ug via INTRAVENOUS

## 2015-05-16 MED ORDER — HYDROMORPHONE HCL 1 MG/ML IJ SOLN
0.2500 mg | INTRAMUSCULAR | Status: DC | PRN
  Administered 2015-05-16 (×4): 0.25 mg via INTRAVENOUS

## 2015-05-16 MED ORDER — LEVOTHYROXINE SODIUM 75 MCG PO TABS: 150.0000 ug | ORAL_TABLET | Freq: Every day | ORAL | Status: DC

## 2015-05-16 MED ORDER — HYDROMORPHONE HCL 1 MG/ML IJ SOLN
INTRAMUSCULAR | Status: AC
  Administered 2015-05-16: 0.25 mg via INTRAVENOUS
  Filled 2015-05-16: qty 1

## 2015-05-16 MED ORDER — SUGAMMADEX SODIUM 200 MG/2ML IV SOLN
INTRAVENOUS | Status: DC | PRN
  Administered 2015-05-16: 200 mg via INTRAVENOUS

## 2015-05-16 MED ORDER — GLYCOPYRROLATE 0.2 MG/ML IJ SOLN
INTRAMUSCULAR | Status: DC | PRN
  Administered 2015-05-16: 0.2 mg via INTRAVENOUS

## 2015-05-16 MED ORDER — VALSARTAN-HYDROCHLOROTHIAZIDE 320-25 MG PO TABS: 1.0000 | ORAL_TABLET | Freq: Every day | ORAL | Status: DC

## 2015-05-16 MED ORDER — SODIUM CHLORIDE 0.9 % IV SOLN: 250.0000 mL | INTRAVENOUS | Status: DC

## 2015-05-16 MED ORDER — VITAMIN D 1000 UNITS PO TABS
1000.0000 [IU] | ORAL_TABLET | Freq: Every day | ORAL | Status: DC
  Administered 2015-05-17: 1000 [IU] via ORAL
  Filled 2015-05-16: qty 1

## 2015-05-16 MED ORDER — PROPOFOL 10 MG/ML IV BOLUS
INTRAVENOUS | Status: DC | PRN
  Administered 2015-05-16: 110 mg via INTRAVENOUS
  Administered 2015-05-16: 40 mg via INTRAVENOUS

## 2015-05-16 MED ORDER — ACETAMINOPHEN 325 MG PO TABS: 650.0000 mg | ORAL_TABLET | Freq: Four times a day (QID) | ORAL | Status: DC | PRN

## 2015-05-16 MED ORDER — LIDOCAINE HCL (CARDIAC) 20 MG/ML IV SOLN
INTRAVENOUS | Status: DC | PRN
Start: 2015-05-16 — End: 2015-05-16
  Administered 2015-05-16: 60 mg via INTRAVENOUS

## 2015-05-16 MED ORDER — DOCUSATE SODIUM 100 MG PO CAPS
100.0000 mg | ORAL_CAPSULE | Freq: Two times a day (BID) | ORAL | Status: DC
  Administered 2015-05-16 – 2015-05-17 (×2): 100 mg via ORAL
  Filled 2015-05-16 (×2): qty 1

## 2015-05-16 MED ORDER — SODIUM CHLORIDE 0.9% FLUSH: 3.0000 mL | INTRAVENOUS | Status: DC | PRN

## 2015-05-16 MED ORDER — PROPOFOL 10 MG/ML IV BOLUS
INTRAVENOUS | Status: AC
  Filled 2015-05-16: qty 20

## 2015-05-16 MED ORDER — POLYETHYLENE GLYCOL 3350 17 G PO PACK: 17.0000 g | PACK | Freq: Every day | ORAL | Status: DC | PRN

## 2015-05-16 MED ORDER — BISACODYL 10 MG RE SUPP: 10.0000 mg | Freq: Every day | RECTAL | Status: DC | PRN

## 2015-05-16 MED ORDER — KETOROLAC TROMETHAMINE 30 MG/ML IJ SOLN
30.0000 mg | Freq: Once | INTRAMUSCULAR | Status: AC
  Administered 2015-05-16: 30 mg via INTRAVENOUS
  Filled 2015-05-16: qty 1

## 2015-05-16 MED ORDER — MENTHOL 3 MG MT LOZG: 1.0000 | LOZENGE | OROMUCOSAL | Status: DC | PRN

## 2015-05-16 SURGICAL SUPPLY — 53 items
ADH SKN CLS APL DERMABOND .7 (GAUZE/BANDAGES/DRESSINGS) ×1
BUR ROUND FLUTED 4 SOFT TCH (BURR) IMPLANT
BUR ROUND FLUTED 4MM SOFT TCH (BURR)
BUR SABER RD CUTTING 3.0 (BURR) ×2 IMPLANT
BUR SABER RD CUTTING 3.0MM (BURR) ×1
CANISTER SUCTION 2500CC (MISCELLANEOUS) ×3 IMPLANT
COVER MAYO STAND STRL (DRAPES) ×3 IMPLANT
COVER SURGICAL LIGHT HANDLE (MISCELLANEOUS) ×3 IMPLANT
DERMABOND ADVANCED (GAUZE/BANDAGES/DRESSINGS) ×2
DERMABOND ADVANCED .7 DNX12 (GAUZE/BANDAGES/DRESSINGS) ×1 IMPLANT
DRAPE C-ARM 42X72 X-RAY (DRAPES) IMPLANT
DRAPE MICROSCOPE LEICA (MISCELLANEOUS) ×3 IMPLANT
DRAPE PROXIMA HALF (DRAPES) ×2 IMPLANT
DRAPE SURG 17X23 STRL (DRAPES) ×12 IMPLANT
DRSG MEPILEX BORDER 4X4 (GAUZE/BANDAGES/DRESSINGS) ×2 IMPLANT
DRSG MEPILEX BORDER 4X8 (GAUZE/BANDAGES/DRESSINGS) IMPLANT
DURAPREP 26ML APPLICATOR (WOUND CARE) ×3 IMPLANT
ELECT BLADE 4.0 EZ CLEAN MEGAD (MISCELLANEOUS) ×3
ELECT CAUTERY BLADE 6.4 (BLADE) ×3 IMPLANT
ELECT REM PT RETURN 9FT ADLT (ELECTROSURGICAL) ×3
ELECTRODE BLDE 4.0 EZ CLN MEGD (MISCELLANEOUS) ×1 IMPLANT
ELECTRODE REM PT RTRN 9FT ADLT (ELECTROSURGICAL) ×1 IMPLANT
GLOVE BIOGEL PI IND STRL 8 (GLOVE) ×1 IMPLANT
GLOVE BIOGEL PI INDICATOR 8 (GLOVE) ×2
GLOVE ECLIPSE 8.5 STRL (GLOVE) ×3 IMPLANT
GLOVE ORTHO TXT STRL SZ7.5 (GLOVE) ×3 IMPLANT
GLOVE SURG 8.5 LATEX PF (GLOVE) ×3 IMPLANT
GOWN STRL REUS W/ TWL LRG LVL3 (GOWN DISPOSABLE) ×1 IMPLANT
GOWN STRL REUS W/TWL 2XL LVL3 (GOWN DISPOSABLE) ×6 IMPLANT
GOWN STRL REUS W/TWL LRG LVL3 (GOWN DISPOSABLE) ×3
KIT BASIN OR (CUSTOM PROCEDURE TRAY) ×3 IMPLANT
KIT ROOM TURNOVER OR (KITS) ×3 IMPLANT
NDL SPNL 18GX3.5 QUINCKE PK (NEEDLE) ×2 IMPLANT
NEEDLE SPNL 18GX3.5 QUINCKE PK (NEEDLE) ×6 IMPLANT
NS IRRIG 1000ML POUR BTL (IV SOLUTION) ×3 IMPLANT
PACK LAMINECTOMY ORTHO (CUSTOM PROCEDURE TRAY) ×3 IMPLANT
PAD ARMBOARD 7.5X6 YLW CONV (MISCELLANEOUS) ×6 IMPLANT
PATTIES SURGICAL .5 X.5 (GAUZE/BANDAGES/DRESSINGS) IMPLANT
PATTIES SURGICAL .75X.75 (GAUZE/BANDAGES/DRESSINGS) IMPLANT
SPONGE LAP 4X18 X RAY DECT (DISPOSABLE) IMPLANT
SPONGE SURGIFOAM ABS GEL 100 (HEMOSTASIS) ×3 IMPLANT
SUT VIC AB 1 CTX 36 (SUTURE) ×3
SUT VIC AB 1 CTX36XBRD ANBCTR (SUTURE) ×1 IMPLANT
SUT VIC AB 2-0 CT1 27 (SUTURE) ×3
SUT VIC AB 2-0 CT1 TAPERPNT 27 (SUTURE) ×1 IMPLANT
SUT VIC AB 3-0 X1 27 (SUTURE) ×3 IMPLANT
SUT VICRYL 0 UR6 27IN ABS (SUTURE) ×3 IMPLANT
SYR 20CC LL (SYRINGE) ×3 IMPLANT
SYR CONTROL 10ML LL (SYRINGE) ×3 IMPLANT
TOWEL OR 17X24 6PK STRL BLUE (TOWEL DISPOSABLE) ×3 IMPLANT
TOWEL OR 17X26 10 PK STRL BLUE (TOWEL DISPOSABLE) ×3 IMPLANT
TRAY FOLEY CATH 16FRSI W/METER (SET/KITS/TRAYS/PACK) IMPLANT
WATER STERILE IRR 1000ML POUR (IV SOLUTION) ×3 IMPLANT

## 2015-05-16 NOTE — Anesthesia Postprocedure Evaluation (Signed)
Anesthesia Post Note  Patient: ASYIA CIHLAR  Procedure(s) Performed: Procedure(s) (LRB): MICRODISCECTOMY Lumbar 3-Lumbar 4 (N/A)  Patient location during evaluation: PACU Anesthesia Type: General Level of consciousness: awake and alert Pain management: pain level controlled Vital Signs Assessment: post-procedure vital signs reviewed and stable Respiratory status: spontaneous breathing, nonlabored ventilation, respiratory function stable and patient connected to nasal cannula oxygen Cardiovascular status: blood pressure returned to baseline and stable Postop Assessment: no signs of nausea or vomiting Anesthetic complications: no    Last Vitals:  Filed Vitals:   05/16/15 1000 05/16/15 1002  BP:  127/89  Pulse: 72 72  Temp:    Resp: 15 13    Last Pain:  Filed Vitals:   05/16/15 1008  PainSc: 8                  Uriah Trueba S

## 2015-05-16 NOTE — Anesthesia Preprocedure Evaluation (Addendum)
Anesthesia Evaluation  Patient identified by MRN, date of birth, ID band Patient awake    Reviewed: Allergy & Precautions, NPO status , Patient's Chart, lab work & pertinent test results, reviewed documented beta blocker date and time   Airway Mallampati: II  TM Distance: >3 FB Neck ROM: Full    Dental no notable dental hx. (+) Edentulous Upper, Edentulous Lower, Dental Advisory Given   Pulmonary neg pulmonary ROS, asthma ,    Pulmonary exam normal breath sounds clear to auscultation       Cardiovascular hypertension, Pt. on medications Normal cardiovascular exam Rhythm:Regular Rate:Normal     Neuro/Psych negative neurological ROS  negative psych ROS   GI/Hepatic negative GI ROS, Neg liver ROS, hiatal hernia, PUD,   Endo/Other  Hypothyroidism   Renal/GU negative Renal ROS  negative genitourinary   Musculoskeletal negative musculoskeletal ROS (+) Arthritis ,   Abdominal (+)  Abdomen: soft. Bowel sounds: normal.  Peds negative pediatric ROS (+)  Hematology negative hematology ROS (+)   Anesthesia Other Findings   Reproductive/Obstetrics negative OB ROS                           Anesthesia Physical Anesthesia Plan  ASA: II  Anesthesia Plan: General   Post-op Pain Management:    Induction: Intravenous  Airway Management Planned: Oral ETT  Additional Equipment:   Intra-op Plan:   Post-operative Plan: Extubation in OR  Informed Consent: I have reviewed the patients History and Physical, chart, labs and discussed the procedure including the risks, benefits and alternatives for the proposed anesthesia with the patient or authorized representative who has indicated his/her understanding and acceptance.   Dental advisory given  Plan Discussed with: CRNA and Surgeon  Anesthesia Plan Comments:         Anesthesia Quick Evaluation

## 2015-05-16 NOTE — Anesthesia Procedure Notes (Addendum)
Spinal Patient location during procedure: OR Preanesthetic Checklist Completed: patient identified, site marked, surgical consent, pre-op evaluation, timeout performed, IV checked, risks and benefits discussed and monitors and equipment checked Spinal Block Patient position: sitting Prep: DuraPrep Patient monitoring: heart rate, cardiac monitor, continuous pulse ox and blood pressure Approach: midline Location: L3-4 Injection technique: single-shot Needle Needle type: Sprotte  Needle gauge: 22 G Needle length: 9 cm Assessment Sensory level: T4 Additional Notes Attempt x 4 no csf, 5 inch 22G used   Procedure Name: Intubation Date/Time: 05/16/2015 7:42 AM Performed by: Lavell Luster Pre-anesthesia Checklist: Patient identified, Emergency Drugs available, Suction available and Patient being monitored Patient Re-evaluated:Patient Re-evaluated prior to inductionOxygen Delivery Method: Circle system utilized Preoxygenation: Pre-oxygenation with 100% oxygen Intubation Type: IV induction Ventilation: Mask ventilation without difficulty and Oral airway inserted - appropriate to patient size Laryngoscope Size: Mac and 3 Grade View: Grade III Tube type: Oral Tube size: 7.0 mm Number of attempts: 2 Airway Equipment and Method: Stylet Placement Confirmation: positive ETCO2 and breath sounds checked- equal and bilateral Secured at: 22 cm Tube secured with: Tape Dental Injury: Teeth and Oropharynx as per pre-operative assessment  Difficulty Due To: Difficult Airway- due to immobile epiglottis Future Recommendations: Recommend- induction with short-acting agent, and alternative techniques readily available Comments: Intubated by T. Joelyn Oms, [place]; Placement confirmed by Dr. Julious Payer   First attempt with Sabra Heck 2 - unable to visual vocal cords/stiff epiglottis

## 2015-05-16 NOTE — Op Note (Signed)
05/16/2015  9:18 AM  PATIENT:  [name]  82 y.o. female  MRN: [id number]  OPERATIVE REPORT  PRE-OPERATIVE DIAGNOSIS:  L3-4 herniated nucleus pulposus  POST-OPERATIVE DIAGNOSIS:  Lumbar 3- Lumbar 4 herniated nucleus pulposis   PROCEDURE:  Procedure(s): MICRODISCECTOMY Lumbar 3-Lumbar 4    SURGEON:  Jessy Oto, MD     ASSISTANT:  Benjiman Core, PA-C  (Present throughout the entire procedure and necessary for completion of procedure in a timely manner)     ANESTHESIA:  General,    COMPLICATIONS:  None.     COMPONENTS:   PROCEDURE:The patient was met in the holding area, and the appropriate right Lumbar level L3-4 identified and marked with "x" and my initials.The patient was then transported to OR and was placed under general anesthesia without difficulty. The patient received appropriate preoperative antibiotic prophylaxis. The patient after intubation atraumatically was transferred to the operating room table, prone position, Wilson frame, sliding OR table. All pressure points were well padded. The arms in 90-90 well-padded at the elbows. Standard prep with Iodophor solution lower dorsal spine to the mid sacral segment. Draped in the usual manner clear Vi-Drape was used.  Time-out procedure was called and correct.   2x 18-gauge spinal needle was then inserted at the expected L3-4 level. Cross table lateral xray  used to identify the spinal needles positions. The lower needle was at the lower aspect of the lamina of L4. Skin superior to this was then infiltrated with Marcaine 0.5% 1:1 exparel 1.3% total of 20cc used. An incision approximately an inch inch and a half in length was then made through skin and subcutaneous layers in the midline superior to the spinal needle entry point. An incision made into the right lumbosacral fascia approximately an inch in length.  Cobb introduced into the incision site and used to carefully form subperiosteal dissection of the paralumbar  muscles off of the posterior lamina of the expected L3-4 level. Boss McCollough retractor blades placed docking on the posterior aspect of the lamina at the expected L3-4 level. This was sterilely attached to the articulating arm and it's up right which had been attached the OR table sterilely.Cross table lateral xray  used to identify a Penfield #4 medial to the L3-4 facet.  The L3-4 interspace carefully debrided the small amount of muscle attachment here and high-speed bur used to drill the medial aspect of the inferior articular process of L3 approximately 30%. The operating room microscope sterilely draped brought into the field. Under the operating room microscope 3 mm Kerrison then used to enter the spinal canal over the superior aspect of the L4 lamina carefully using the Kerrison to debris the attachment as a curet. Foraminotomy was then performed over the right L4  nerve root. The medial 10% superior articular process of L4 and then resected using 3 mm Kerrison. This allowed for identification of the thecal sac. Penfield 4 was then used to carefully mobilize the thecal sac medially and the L4 nerve root identified within the lateral recess flattened over the posterior aspect of the protruded disc. Carefully the lateral aspect of the L4 nerve root was identified and a Penfield 4 was used to mobilize the nerve medially such that the protruded disc was visible with microscope. Using a Penfield 4 for retraction and a 15 blade scalpel was used to incise the posterior longitudinal ligament within the lateral recess on the right side longitudinally. Disc material was removed using micropituitary rongeurs and nerve hook nerve root and  then more easily able to be mobilized medially and retracted using a Derricho retractor. Further foraminotomies was performed over the L4 nerve root the nerve root was noted to be well decompressed , free without further compression. The nerve root able to be retracted along the  medial aspect of the L4 pedicle and no disc material found to be subligamentous at this level.  Ligamentum flavum was further debrided superior to the level L3-4 disc. A moderate amount of further resection of the L3 lamina inferiorly and medially was performed. With this then the disc space at L3-4 was easily visualized and entry into the disc at the sided disc herniation was possible using a Penfield 4 intraoperative Lateral radiograph was used to identify the L3-4 disc with the Penfield 4 In place just below the disc space. Micropituitary was used to further debride this material superficially from the posterior aspect of the intervertebral disc is posterior lateral aspect of the disc. Large amount of further disc material was found subligamentous extending superior and laterally from the disc this was removed using micropituitary rongeurs into the left L3 neuroforamen additionally epstein  currettes were used to decompress the left L4 neuroforamen. Upbiting currettes were used to further remove reflected portions of the reflected portions of the medial L3-4 facet and portions of the superior portion of the L4 superior articular facet. . The opening associated with the extruded fragment was found at the superior attachment of the midline annulus fibrosus.The disc was further debrided at the L3-4 level in the midline. Ligamentum flavum was debrided and lateral recess along the medial aspect L3-4 facet no further decompression was necessary. Ball tip nerve probe was then able to carefully palpate the neuroforamen for L3 and L4 finding these to be well decompressed.  At this point the retractors were readjusted and high-speed bur used to remove a small portion of the inferior right side of the ventral aspect of the spinous process superficial to the ligamentum flavum in the midline and this was carried superiorly to the knot of medial opening along the right side. Bur was then used to carefully remove bone down  to ligamentum flavum and expose superficial extent of ligamentum flavum was then resected using 3 mm Kerrisons and this was carried out centrally into the left side using the central and left-sided the thecal sac. These were found to be well decompressed. Irrigation was carried out.   Bleeding was then controlled using thrombin-soaked Gelfoam small cottonoids Small amount of bleeding within the soft tissue mass the laminotomy area was controlled using bipolar electrocautery. Irrigation was carried out using copious amounts of irrigant solution. All Gelfoam were then removed. No significant active bleeding present at the time of removal. All instruments sponge counts were correct. The retractor system was then carefully removed, with this withdrawal only bipolar electrocautery of any small bleeders. Lumbodorsal fascia was then carefully approximated with interrupted 0 Vicryl sutures, UR 6 needle deep subcutaneous layers were approximated with interrupted 0 Vicryl sutures on UR 6 the appear subcutaneous layers approximated with interrupted 2-0 Vicryl sutures and the skin closed with a running subcutaneous stitch of 4-0 Vicryl. Dermabond was applied allowed to dry and then Mepilex bandage applied. Patient was then carefully returned to supine position on a stretcher, reactivated and extubated. She was then returned to recovery room in satisfactory condition.  Benjiman Core PA-C perform the duties of assistant surgeon during this case. he was present from the beginning of the case to the end of the case assisting  in transfer the patient from her stretcher to the OR table and back to the stretcher at the end of the case. Assisted in careful retraction and suction of the laminectomy site delicate neural structures operating under the operating room microscope. he performed closure of the incision from the fascia to the skin applying the dressing.     Jaylen Knope E  05/16/2015, 9:18 AM

## 2015-05-16 NOTE — Discharge Instructions (Signed)
° ° °  No lifting greater than 10 lbs. °Avoid bending, stooping and twisting. °Walk in house for first week them may start to get out slowly increasing distance up to one quarter mile by 3 weeks post op. °Keep incision dry for 3 days, may use tegaderm or similar water impervious dressing. ° °

## 2015-05-16 NOTE — Care Management Note (Signed)
Case Management Note  Patient Details  Name: LIZAMARIE KEVORKIAN MRN: [id number] Date of Birth: [birth year]  Subjective/Objective:   82 yr old female s/p L3-4 microdiscectomy  .                Action/Plan: Case manager spoke with patient concerning discharge plan and needs. Choice for home health agency was offered. Patient states she has used [facility] in the past, will do so now. Case manager contacted Estrella Myrtle, [employer] with referral. Patient states she has a rolling walker and 3in1 already, states her husband, sons and daughter-in-law will assist her at discharge. Case manager will continue to monitor.  Expected Discharge Date:   05/17/15               Expected Discharge Plan:  [place]  In-House Referral:     Discharge planning Services  CM Consult  Post Acute Care Choice:  Home Health Choice offered to:     DME Arranged:  N/A DME Agency:     HH Arranged:  PT [place] Agency:  [facility]  Status of Service:  In process, will continue to follow  Medicare Important Message Given:    Date Medicare IM Given:    Medicare IM give by:    Date Additional Medicare IM Given:    Additional Medicare Important Message give by:     If discussed at [place] of Stay Meetings, dates discussed:    Additional Comments:  Ninfa Meeker, RN 05/16/2015, 4:15 PM

## 2015-05-16 NOTE — Brief Op Note (Signed)
PATIENT ID:      [name]  MRN:     [id number] DOB/AGE:    [birth year] / 82 y.o.       OPERATIVE REPORT   DATE OF PROCEDURE:  05/16/2015      PREOPERATIVE DIAGNOSIS:   L3-4 herniated nucleus pulposus                                                       Body mass index is 26.79 kg/(m^2).    POSTOPERATIVE DIAGNOSIS:   * No post-op diagnosis entered *                                                                     Body mass index is 26.79 kg/(m^2).    PROCEDURE:  Procedure(s): MICRODISCECTOMY L3-4     SURGEON: NITKA,JAMES E    ASSISTANT:  Benjiman Core PA-C          ANESTHESIA:   General and supplemented with local marcaine 0.5% 1:1 Exparel 1.3% total of 25 cc. Jennette Kettle, CRNA  COMPLICATIONS:  None     EBL: 75CC  DRAINS: None.  CONDITION:  Stable extubated and returned to the recovery room.    NITKA,JAMES E 05/16/2015, 7:33 AM

## 2015-05-16 NOTE — OR Nursing (Addendum)
Pt brought to 5N14 via bed, oxygen and IV pump.  Granddaughter at bedside and concerned if pt was on any medicines for anticoagulation.  I expressed TED and SCDs were on for mechanical prevention of DVT.  From Dr. Otho Ket note regarding pts concerns "She has concerns about using some kind of blood thinner following her back surgery, as she did have this following her knee surgery and I have explained to [facility] that generally we do not cover patients for DVT prophylaxis following lumbar or cervical spine surgery, mainly because the patient's are up and walking and this decreases her tendency to develop a DVT."

## 2015-05-16 NOTE — Interval H&P Note (Signed)
The patient has been re-examined, and the chart reviewed, and there have been no interval changes to the documented history and physical.    The risks, benefits, and alternatives have been discussed at length, and the patient is willing to proceed.   

## 2015-05-16 NOTE — Transfer of Care (Signed)
Immediate Anesthesia Transfer of Care Note  Patient: [name]  Procedure(s) Performed: Procedure(s): MICRODISCECTOMY Lumbar 3-Lumbar 4 (N/A)  Patient Location: PACU  Anesthesia Type:General  Level of Consciousness: awake, alert  and sedated  Airway & Oxygen Therapy: Patient connected to face mask oxygen  Post-op Assessment: Post -op Vital signs reviewed and stable  Post vital signs: stable  Last Vitals:  Filed Vitals:   05/16/15 0626  BP: 129/50  Pulse: 61  Temp: [id number] C    Complications: No apparent anesthesia complications

## 2015-05-17 ENCOUNTER — Encounter (HOSPITAL_COMMUNITY): Payer: Self-pay | Admitting: Specialist

## 2015-05-17 DIAGNOSIS — R531 Weakness: Secondary | ICD-10-CM | POA: Diagnosis not present

## 2015-05-17 DIAGNOSIS — M5126 Other intervertebral disc displacement, lumbar region: Secondary | ICD-10-CM | POA: Diagnosis not present

## 2015-05-17 DIAGNOSIS — I10 Essential (primary) hypertension: Secondary | ICD-10-CM | POA: Diagnosis not present

## 2015-05-17 DIAGNOSIS — E785 Hyperlipidemia, unspecified: Secondary | ICD-10-CM | POA: Diagnosis not present

## 2015-05-17 DIAGNOSIS — E039 Hypothyroidism, unspecified: Secondary | ICD-10-CM | POA: Diagnosis not present

## 2015-05-17 DIAGNOSIS — M5020 Other cervical disc displacement, unspecified cervical region: Secondary | ICD-10-CM | POA: Diagnosis not present

## 2015-05-17 DIAGNOSIS — M4316 Spondylolisthesis, lumbar region: Secondary | ICD-10-CM | POA: Diagnosis not present

## 2015-05-17 MED ORDER — HYDROCODONE-ACETAMINOPHEN 5-325 MG PO TABS: 1.0000 | ORAL_TABLET | ORAL | Status: DC | PRN

## 2015-05-17 MED ORDER — METHOCARBAMOL 500 MG PO TABS: 500.0000 mg | ORAL_TABLET | Freq: Three times a day (TID) | ORAL | Status: DC | PRN

## 2015-05-17 MED ORDER — LEVOTHYROXINE SODIUM 75 MCG PO TABS
162.5000 ug | ORAL_TABLET | Freq: Every day | ORAL | Status: DC
  Administered 2015-05-17: 162.5 ug via ORAL
  Filled 2015-05-17 (×2): qty 2

## 2015-05-17 MED FILL — Thrombin For Soln 20000 Unit: CUTANEOUS | Qty: 1 | Status: AC

## 2015-05-17 NOTE — Progress Notes (Signed)
Awake, alert and oriented, up in chair at bedside. Has been working with PT walking in hallway without difficulty. No leg nu mbness or weakness. C/o of some left buttock pain likely referred from Lam and discectomy Site. Wishes to be discharged today. Legs NV normal. Will discharge this afternoon with [facility] referral for PT.

## 2015-05-17 NOTE — Evaluation (Addendum)
Occupational Therapy Evaluation Patient Details Name: [name] MRN: [id number] DOB: June 21, 1925 Today's Date: 05/17/2015    History of Present Illness 82 yr old female s/p L3-4 microdiscectomy .   Clinical Impression   Patient presenting with decreased ADL and functional mobility independence secondary to above. Patient independent to mod I PTA. Patient currently functioning at an overall min guard to min assist level. Patient will benefit from acute OT to increase overall independence in the areas of ADLs, functional mobility, and overall safety in order to safely discharge home with family support and assistance.   Pt's 02 sats remained greater than 90% entire session on RA, during rest and during activity.     Follow Up Recommendations  No OT follow up;Supervision/Assistance - 24 hour    Equipment Recommendations  None recommended by OT    Recommendations for Other Services  None at this time    Precautions / Restrictions Precautions Precautions: Back;Fall Precaution Comments: reviewed back precautions, handout given Restrictions Weight Bearing Restrictions: No    Mobility Bed Mobility General bed mobility comments: Pt found seated EOB upon OT entering room and left seated in recliner upon OT exiting room   Transfers Overall transfer level: Needs assistance Equipment used: Rolling walker (2 wheeled);Straight cane Transfers: Sit to/from Stand Sit to Stand: Min assist General transfer comment: Sit to/from stand from EOB, sit to/from stand from elevated toilet seat. Pt more unsteady using straight cane, recommending RW at this time. Discussed this with patient and family.     Balance Overall balance assessment: Needs assistance Sitting-balance support: No upper extremity supported;Feet supported Sitting balance-Leahy Scale: Good     Standing balance support: No upper extremity supported;During functional activity Standing balance-Leahy Scale: Poor Standing  balance comment: Fair with BUE support using RW    ADL Overall ADL's : Needs assistance/impaired Eating/Feeding: Set up;Sitting   Grooming: Min guard;Standing Grooming Details (indicate cue type and reason): at sink Upper Body Bathing: Set up;Sitting   Lower Body Bathing: Min guard;Sit to/from stand   Upper Body Dressing : Set up;Sitting   Lower Body Dressing: Min guard;Sit to/from stand   Toilet Transfer: Minimal assistance;RW;Ambulation;Comfort height toilet;Grab bars   Toileting- Clothing Manipulation and Hygiene: Supervision/safety;Sit to/from stand;Min guard     Tub/Shower Transfer Details (indicate cue type and reason): did not occur Functional mobility during ADLs: Minimal assistance;Min guard;Cueing for safety;Rolling walker General ADL Comments: Educated pt on use of reacher for assist with LB ADLs and picking up light objects off the floor. Extensively went over back precautions and demonstrated do's and don'ts. Educated pt and granddaughter on recommendation for patient to perform sponge baths when first going home and performing "dry-run" shower stall transfer. Recommending 24/7 upon patient initially going home.     Vision Additional Comments: no change from baseline          Pertinent Vitals/Pain Pain Assessment: 0-10 Pain Score: 7  Pain Location: back Pain Descriptors / Indicators: Aching;Guarding;Sore Pain Intervention(s): Monitored during session;Limited activity within patient's tolerance;Premedicated before session;Repositioned     Hand Dominance Right   Extremity/Trunk Assessment Upper Extremity Assessment Upper Extremity Assessment: Overall WFL for tasks assessed   Lower Extremity Assessment Lower Extremity Assessment: Defer to PT evaluation   Cervical / Trunk Assessment Cervical / Trunk Assessment: Normal   Communication Communication Communication: No difficulties   Cognition Arousal/Alertness: Awake/alert Behavior During Therapy: WFL for  tasks assessed/performed Overall Cognitive Status: Within Functional Limits for tasks assessed  Home Living Family/patient expects to be discharged to:: Private residence Living Arrangements: Spouse/significant other;Children Available Help at Discharge: Family;Available 24 hours/day Type of Home: House Home Access: Stairs to enter   Entrance Stairs-Rails: Right;Left;Can reach both Home Layout: Multi-level;Able to live on main level with bedroom/bathroom Alternate Level Stairs-Number of Steps: flight   Bathroom Shower/Tub: Tub/shower unit;Curtain   Bathroom Toilet: Handicapped height     Home Equipment: Shower seat;Hand held Tourist information centre manager - 2 wheels;Cane - single point;Bedside commode;Adaptive equipment Adaptive Equipment: Reacher        Prior Functioning/Environment Level of Independence: Independent with assistive device(s)  Comments: Pt reports she ambulates with cane outside and in community, no AD inside     OT Diagnosis: Generalized weakness;Acute pain   OT Problem List: Decreased strength;Decreased activity tolerance;Impaired balance (sitting and/or standing);Decreased safety awareness;Decreased knowledge of use of DME or AE;Decreased knowledge of precautions;Pain   OT Treatment/Interventions: Self-care/ADL training;Energy conservation;DME and/or AE instruction;Therapeutic activities;Patient/family education;Balance training    OT Goals(Current goals can be found in the care plan section) Acute Rehab OT Goals Patient Stated Goal: go home today OT Goal Formulation: With patient/family Time For Goal Achievement: 05/31/15 Potential to Achieve Goals: Good ADL Goals Pt Will Perform Grooming: with modified independence;standing Pt Will Perform Lower Body Bathing: with modified independence;sit to/from stand (using AE prn) Pt Will Perform Lower Body Dressing: with modified independence;sit to/from stand (using AE prn) Pt Will Transfer to Toilet: with  modified independence;ambulating;bedside commode Pt Will Perform Tub/Shower Transfer: with supervision;ambulating;shower seat;rolling walker;Tub transfer Additional ADL Goal #1: Pt will be mod I for functional mobility during ADL using LRAD   OT Frequency: Min 2X/week   Barriers to D/C: none known at this time    End of Session Equipment Utilized During Treatment: Gait belt;Rolling walker Nurse Communication: Mobility status  Activity Tolerance: Patient tolerated treatment well Patient left: in chair;with call bell/phone within reach;with family/visitor present   Time: 0751-0829 OT Time Calculation (min): 38 min Charges:  OT General Charges $OT Visit: 1 Procedure OT Evaluation $OT Eval Low Complexity: 1 Procedure OT Treatments $Self Care/Home Management : 23-37 mins G-Codes: OT G-codes **NOT FOR INPATIENT CLASS** Functional Limitation: Self care Self Care Current Status [id number]): At least 1 percent but less than 20 percent impaired, limited or restricted (min guard/min assist) Self Care Goal Status [id number]): At least 1 percent but less than 20 percent impaired, limited or restricted (mod I)  Chrys Racer , MS, OTR/L, CLT Pager: [phone]  05/17/2015, 8:50 AM

## 2015-05-17 NOTE — Evaluation (Signed)
Physical Therapy Evaluation Patient Details Name: [name] MRN: [id number] DOB: September 17, 1925 Today's Date: 05/17/2015   History of Present Illness  82 yr old female s/p L3-4 microdiscectomy .  Clinical Impression  Patient demonstrates deficits in functional mobility as indicated below. Will need continued skilled PT to address deficits and maximize function. Will see as indicated and progress as tolerated.    Follow Up Recommendations Supervision for mobility/OOB    Equipment Recommendations  Rolling walker with 5" wheels    Recommendations for Other Services       Precautions / Restrictions Precautions Precautions: Back;Fall Precaution Comments: reviewed back precautions Restrictions Weight Bearing Restrictions: No      Mobility  Bed Mobility Overal bed mobility: Needs Assistance Bed Mobility: Rolling;Sidelying to Sit;Sit to Sidelying Rolling: Min guard Sidelying to sit: Min assist     Sit to sidelying: Min assist General bed mobility comments: performed x2 during this session. Cued for technique and sequencing, min assist for positioning.  Transfers Overall transfer level: Needs assistance Equipment used: Rolling walker (2 wheeled);Straight cane Transfers: Sit to/from Stand Sit to Stand: Min assist         General transfer comment: performed from bed x3, chair x1, and toilet x1. min assist initially, min guard with cues for hand placement and positioning with precautions.  Ambulation/Gait Ambulation/Gait assistance: Supervision;Min guard Ambulation Distance (Feet): 140 Feet Assistive device: Rolling walker (2 wheeled) Gait Pattern/deviations: Step-through pattern;Decreased stride length;Drifts right/left Gait velocity: decreased Gait velocity interpretation: Below normal speed for age/gender General Gait Details: educated on increased cadence and normalized gait pattern. VCs for use of rw with relaxation of UE.   Stairs Stairs: Yes Stairs  assistance: Min assist Stair Management: One rail Right;Step to pattern;Forwards Number of Stairs: 2 General stair comments: min assist dur to LE weakness and RLE hip pain  Wheelchair Mobility    Modified Rankin (Stroke Patients Only)       Balance Overall balance assessment: Needs assistance Sitting-balance support: No upper extremity supported;Feet supported Sitting balance-Leahy Scale: Good     Standing balance support: Bilateral upper extremity supported Standing balance-Leahy Scale: Poor Standing balance comment: Reliance on UE support                             Pertinent Vitals/Pain Pain Assessment: 0-10 Pain Score: 7  Pain Location: back and hip Pain Descriptors / Indicators: Aching;Grimacing;Sore Pain Intervention(s): Monitored during session;Repositioned;Patient requesting pain meds-RN notified    Home Living Family/patient expects to be discharged to:: Private residence Living Arrangements: Spouse/significant other;Children Available Help at Discharge: Family;Available 24 hours/day Type of Home: House Home Access: Stairs to enter Entrance Stairs-Rails: Right;Left;Can reach both   Home Layout: Multi-level;Able to live on main level with bedroom/bathroom Home Equipment: [place] held Tourist information centre manager - 2 wheels;Cane - single point;Bedside commode;Adaptive equipment      Prior Function Level of Independence: Independent with assistive device(s)         Comments: Pt reports she ambulates with cane outside and in community, no AD inside      Hand Dominance   Dominant Hand: Right    Extremity/Trunk Assessment   Upper Extremity Assessment: Overall WFL for tasks assessed           Lower Extremity Assessment: Generalized weakness;RLE deficits/detail      Cervical / Trunk Assessment: Normal  Communication   Communication: No difficulties  Cognition Arousal/Alertness: Awake/alert Behavior During Therapy: WFL for tasks  assessed/performed Overall  Cognitive Status: Within Functional Limits for tasks assessed                      General Comments      Exercises        Assessment/Plan    PT Assessment    PT Diagnosis Difficulty walking;Abnormality of gait   PT Problem List    PT Treatment Interventions     PT Goals (Current goals can be found in the Care Plan section) Acute Rehab PT Goals Patient Stated Goal: go home today PT Goal Formulation: With patient Time For Goal Achievement: 05/31/15 Potential to Achieve Goals: Good    Frequency     Barriers to discharge        Co-evaluation               End of Session Equipment Utilized During Treatment: Gait belt Activity Tolerance: Patient limited by fatigue Patient left: in bed;with call bell/phone within reach;with family/visitor present Nurse Communication: Mobility status    Functional Assessment Tool Used: clinical judgement Functional Limitation: Mobility: Walking and moving around Mobility: Walking and Moving Around Current Status [phone]): At least 20 percent but less than 40 percent impaired, limited or restricted Mobility: Walking and Moving Around Goal Status [phone]): At least 1 percent but less than 20 percent impaired, limited or restricted    Time: 1010-1036 PT Time Calculation (min) (ACUTE ONLY): 26 min   Charges:   PT Evaluation $PT Eval Moderate Complexity: 1 Procedure     PT G Codes:   PT G-Codes **NOT FOR INPATIENT CLASS** Functional Assessment Tool Used: clinical judgement Functional Limitation: Mobility: Walking and moving around Mobility: Walking and Moving Around Current Status [id number]): At least 20 percent but less than 40 percent impaired, limited or restricted Mobility: Walking and Moving Around Goal Status [phone]): At least 1 percent but less than 20 percent impaired, limited or restricted    Duncan Dull 05/17/2015, 10:55 AM Alben Deeds, PT DPT  [phone]

## 2015-05-17 NOTE — Discharge Summary (Signed)
Physician Discharge Summary      Patient ID: [name] MRN: [id number] DOB/AGE: [birth year] 80 y.o.  Admit date: 05/16/2015 Discharge date:05/17/2015 Admission Diagnoses:  Principal Problem:   HNP (herniated nucleus pulposus), cervical Active Problems:   Herniated nucleus pulposus, lumbar   Discharge Diagnoses:  Same  Past Medical History  Diagnosis Date  . Breast cyst   . Unspecified essential hypertension   . Other and unspecified hyperlipidemia   . Peptic ulcer disease   . ITP (idiopathic thrombocytopenic purpura)     not a problem now.  . Rhinitis   . Elevated blood sugar   . Unspecified hypothyroidism   . Goiter   . Colon polyps   . Adhesion of abdominal wall     "continues to have abdominal pain pain intermittent right abdominal side. "scar tissue"  . Esophageal stricture     "occ. problems with swallowing water"  . Bronchitis, asthmatic     'bronchitis that will go into an asthma like attack but haven't had it in years'  . History of hiatal hernia     repaired over 20 years ago  . Arthritis   . Full dentures   . History of blood transfusion   . GI bleed     Surgeries: Procedure(s): MICRODISCECTOMY Lumbar 3-Lumbar 4 on 05/16/2015   Consultants:    Discharged Condition: Improved  Hospital Course: [name] is an 82 y.o. female who was admitted 05/16/2015 with a chief complaint of No chief complaint on file. , and found to have a diagnosis of HNP (herniated nucleus pulposus), cervical.  She was brought to the operating room on 05/16/2015 and underwent the above named procedures.    She was given perioperative antibiotics:  Anti-infectives    Start     Dose/Rate Route Frequency Ordered Stop   05/16/15 1600  ceFAZolin (ANCEF) IVPB 1 g/50 mL premix     1 g 100 mL/hr over 30 Minutes Intravenous Every 8 hours 05/16/15 1200 05/16/15 2225   05/16/15 0700  ceFAZolin (ANCEF) IVPB 2 g/50 mL premix     2 g 100 mL/hr over 30 Minutes Intravenous To  [facility] 05/13/15 1114 05/16/15 0835    Recovered in the PACU and then transferred to [place] [facility] med surg floor For post operative care. Unfortunately transfer occurred later in the day She tolerated transition to regular diet and transitioned to  Oral narcotics. She was required to use a bedpan during initial period post op. POD#1 awake, alert and oriented x 4. Dressing changed and incision was dry, without erythrema or drainage. Demonstrated normal  Motor and sensory. Worked with PT and did well with ambulation into the halllway. In the afternoon she was awake alert and ready to go home. She was discharged home on POD#1.  She was given sequential compression devices and early ambulation for DVT prophylaxis.  She benefited maximally from their hospital stay and there were no complications.    Recent vital signs:  Filed Vitals:   05/17/15 0045 05/17/15 0447  BP: 130/52 100/35  Pulse: 59 59  Temp: 98.9 F (37.2 C) 98.7 F (37.1 C)  Resp: 15 15    Recent laboratory studies:  Results for orders placed or performed during the hospital encounter of 05/11/15  Surgical pcr screen  Result Value Ref Range   MRSA, PCR NEGATIVE NEGATIVE   Staphylococcus aureus NEGATIVE NEGATIVE  APTT  Result Value Ref Range   aPTT 27 24 - 37 seconds  CBC  Result Value  Ref Range   WBC 12.5 (H) 4.0 - 10.5 K/uL   RBC 4.40 3.87 - 5.11 MIL/uL   Hemoglobin 13.3 12.0 - 15.0 g/dL   HCT 43.1 36.0 - 46.0 %   MCV 98.0 78.0 - 100.0 fL   MCH 30.2 26.0 - 34.0 pg   MCHC 30.9 30.0 - 36.0 g/dL   RDW 13.6 11.5 - 15.5 %   Platelets 136 (L) 150 - 400 K/uL  Comprehensive metabolic panel  Result Value Ref Range   Sodium 144 135 - 145 mmol/L   Potassium 3.6 3.5 - 5.1 mmol/L   Chloride 105 101 - 111 mmol/L   CO2 26 22 - 32 mmol/L   Glucose, Bld 94 65 - 99 mg/dL   BUN 14 6 - 20 mg/dL   Creatinine, Ser 1.13 (H) 0.44 - 1.00 mg/dL   Calcium 9.7 8.9 - 10.3 mg/dL   Total Protein 7.9 6.5 - 8.1 g/dL   Albumin 3.7 3.5  - 5.0 g/dL   AST 25 15 - 41 U/L   ALT 11 (L) 14 - 54 U/L   Alkaline Phosphatase 61 38 - 126 U/L   Total Bilirubin 0.9 0.3 - 1.2 mg/dL   GFR calc non Af Amer 42 (L) >60 mL/min   GFR calc Af Amer 49 (L) >60 mL/min   Anion gap 13 5 - 15  Protime-INR  Result Value Ref Range   Prothrombin Time 13.7 11.6 - 15.2 seconds   INR 1.03 0.00 - 1.49  Urinalysis, Routine w reflex microscopic (not at [facility])  Result Value Ref Range   Color, Urine YELLOW YELLOW   APPearance CLOUDY (A) CLEAR   Specific Gravity, Urine 1.017 1.005 - 1.030   pH 5.0 5.0 - 8.0   Glucose, UA NEGATIVE NEGATIVE mg/dL   Hgb urine dipstick MODERATE (A) NEGATIVE   Bilirubin Urine NEGATIVE NEGATIVE   Ketones, ur NEGATIVE NEGATIVE mg/dL   Protein, ur NEGATIVE NEGATIVE mg/dL   Nitrite NEGATIVE NEGATIVE   Leukocytes, UA SMALL (A) NEGATIVE  Urine microscopic-add on  Result Value Ref Range   Squamous Epithelial / LPF 0-5 (A) NONE SEEN   WBC, UA 0-5 0 - 5 WBC/hpf   RBC / HPF 6-30 0 - 5 RBC/hpf   Bacteria, UA MANY (A) NONE SEEN    Discharge Medications:     Medication List    STOP taking these medications        acetaminophen 325 MG tablet  Commonly known as:  TYLENOL      TAKE these medications        atorvastatin 80 MG tablet  Commonly known as:  LIPITOR  TAKE 1 TABLET ONCE A DAY     calcium citrate-vitamin D 315-200 MG-UNIT tablet  Commonly known as:  CITRACAL+D  Take 1 tablet by mouth daily.     HYDROcodone-acetaminophen 7.5-325 MG tablet  Commonly known as:  NORCO  Take 1 tablet by mouth 2 (two) times daily. Take 1 tab by mouth twice a day     HYDROcodone-acetaminophen 5-325 MG tablet  Commonly known as:  NORCO/VICODIN  Take 1-2 tablets by mouth every 4 (four) hours as needed for moderate pain (mild pain).     levothyroxine 25 MCG tablet  Commonly known as:  LEVOTHROID  Take 1/2 tablet (12.66mcg) daily.  Along with 137mcg of Synthroid     levothyroxine 150 MCG tablet  Commonly known as:  SYNTHROID,  LEVOTHROID  TAKE 1 TABLET DAILY     methocarbamol 500 MG tablet  Commonly known as:  ROBAXIN  Take 1 tablet (500 mg total) by mouth every 8 (eight) hours as needed for muscle spasms.     multivitamin with minerals Tabs tablet  Take 1 tablet by mouth daily.     omeprazole 20 MG capsule  Commonly known as:  PRILOSEC  Take 20 mg by mouth daily.     polyvinyl alcohol 1.4 % ophthalmic solution  Commonly known as:  LIQUIFILM TEARS  Place 1 drop into both eyes as needed for dry eyes.     valsartan-hydrochlorothiazide 320-25 MG tablet  Commonly known as:  DIOVAN-HCT  TAKE 1 TABLET DAILY     Vitamin D3 2000 units Tabs  Take 1 tablet by mouth daily.     zolpidem 10 MG tablet  Commonly known as:  AMBIEN  Take 1 tablet (10 mg total) by mouth at bedtime as needed. for sleep        Diagnostic Studies: Dg Chest 2 View  05/11/2015  CLINICAL DATA:  Preop for disc surgery EXAM: CHEST  2 VIEW COMPARISON:  Chest x-ray of 09/08/2013 FINDINGS: The opacity on the lateral view in the region of the lingula has resolved. Minimal scarring is noted at the left lung base. The right lung is clear. No effusion is seen. Mediastinal and hilar contours are unremarkable. The heart is within normal limits in size. IMPRESSION: Minimal scarring in the lingula. Much of the opacity in the lingula has resolved. No definite active process is seen. Electronically Signed   By: Ivar Drape M.D.   On: 05/11/2015 16:18   Dg Lumbar Spine 2-3 Views  05/16/2015  CLINICAL DATA:  Discectomy. EXAM: LUMBAR SPINE - 2-3 VIEW COMPARISON:  None. FINDINGS: Lumbar vertebra numbered as per prior MRI of 02/02/2015. Metallic markers noted posteriorly at the level of L3-L4 and L4-L5. IMPRESSION: Metallic markers noted posteriorly at the L3-L4 and L4-L5 disc space levels. Electronically Signed   By: [name]  Register   On: 05/16/2015 09:02    Disposition: 01-Home or Self Care      Discharge Instructions    Call MD / Call 911    Complete  by:  As directed   If you experience chest pain or shortness of breath, CALL 911 and be transported to the hospital emergency room.  If you develope a fever above 101 F, pus (white drainage) or increased drainage or redness at the wound, or calf pain, call your surgeon's office.     Constipation Prevention    Complete by:  As directed   Drink plenty of fluids.  Prune juice may be helpful.  You may use a stool softener, such as Colace (over the counter) 100 mg twice a day.  Use MiraLax (over the counter) for constipation as needed.     Diet - low sodium heart healthy    Complete by:  As directed      Discharge instructions    Complete by:  As directed   No lifting greater than 10 lbs. Avoid bending, stooping and twisting. Walk in house for first week them may start to get out slowly increasing distance up to one quarter mile by 3 weeks post op. Keep incision dry for 3 days, may use tegaderm or similar water impervious dressing.     Driving restrictions    Complete by:  As directed   No driving for 4 weeks     Increase activity slowly as tolerated    Complete by:  As directed  Lifting restrictions    Complete by:  As directed   No lifting for 8 weeks           Follow-up Information    Follow up with [place] Silveria E, MD In 2 weeks.   Specialty:  Orthopedic Surgery   Why:  For wound re-check   Contact information:   [place] [address] [phone]        Signed: Jessy Oto 05/17/2015, 1:42 PM

## 2015-05-17 NOTE — Progress Notes (Addendum)
     Subjective: 1 Day Post-Op Procedure(s) (LRB): MICRODISCECTOMY Lumbar 3-Lumbar 4 (N/A) Awake,alert and oriented x 4 Patient reports pain as mild.   Has not been out of bed since arriving post op yesterday morning. O2 pnr  Objective:   VITALS:  Temp:  [97.4 F (36.3 C)-99.2 F (37.3 C)] 98.7 F (37.1 C) (03/07 0447) Pulse Rate:  [53-75] 59 (03/07 0447) Resp:  [10-26] 15 (03/07 0447) BP: (100-147)/(35-89) 100/35 mmHg (03/07 0447) SpO2:  [96 %-100 %] 96 % (03/07 0447)  Neurologically intact ABD soft Neurovascular intact Sensation intact distally Intact pulses distally Dorsiflexion/Plantar flexion intact Incision: no drainage   LABS No results for input(s): HGB, WBC, PLT in the last 72 hours. No results for input(s): NA, K, CL, CO2, BUN, CREATININE, GLUCOSE in the last 72 hours. No results for input(s): LABPT, INR in the last 72 hours.   Assessment/Plan: 1 Day Post-Op Procedure(s) (LRB): MICRODISCECTOMY Lumbar 3-Lumbar 4 (N/A)  Advance diet Up with therapy Discharge home with home health if she tolerates PT/OT and Wean from Oxygen pnc. I will check in on her at noon and determine if she is ready for discharge today.  NITKA,JAMES E 05/17/2015, 7:44 AM

## 2015-05-18 DIAGNOSIS — E785 Hyperlipidemia, unspecified: Secondary | ICD-10-CM | POA: Diagnosis not present

## 2015-05-18 DIAGNOSIS — Z4789 Encounter for other orthopedic aftercare: Secondary | ICD-10-CM | POA: Diagnosis not present

## 2015-05-18 DIAGNOSIS — I10 Essential (primary) hypertension: Secondary | ICD-10-CM | POA: Diagnosis not present

## 2015-05-18 DIAGNOSIS — M1990 Unspecified osteoarthritis, unspecified site: Secondary | ICD-10-CM | POA: Diagnosis not present

## 2015-05-18 DIAGNOSIS — K219 Gastro-esophageal reflux disease without esophagitis: Secondary | ICD-10-CM | POA: Diagnosis not present

## 2015-05-18 DIAGNOSIS — E039 Hypothyroidism, unspecified: Secondary | ICD-10-CM | POA: Diagnosis not present

## 2015-05-19 DIAGNOSIS — I10 Essential (primary) hypertension: Secondary | ICD-10-CM | POA: Diagnosis not present

## 2015-05-19 DIAGNOSIS — E785 Hyperlipidemia, unspecified: Secondary | ICD-10-CM | POA: Diagnosis not present

## 2015-05-19 DIAGNOSIS — E039 Hypothyroidism, unspecified: Secondary | ICD-10-CM | POA: Diagnosis not present

## 2015-05-19 DIAGNOSIS — M1990 Unspecified osteoarthritis, unspecified site: Secondary | ICD-10-CM | POA: Diagnosis not present

## 2015-05-19 DIAGNOSIS — K219 Gastro-esophageal reflux disease without esophagitis: Secondary | ICD-10-CM | POA: Diagnosis not present

## 2015-05-19 DIAGNOSIS — Z4789 Encounter for other orthopedic aftercare: Secondary | ICD-10-CM | POA: Diagnosis not present

## 2015-05-23 DIAGNOSIS — E039 Hypothyroidism, unspecified: Secondary | ICD-10-CM | POA: Diagnosis not present

## 2015-05-23 DIAGNOSIS — K219 Gastro-esophageal reflux disease without esophagitis: Secondary | ICD-10-CM | POA: Diagnosis not present

## 2015-05-23 DIAGNOSIS — Z4789 Encounter for other orthopedic aftercare: Secondary | ICD-10-CM | POA: Diagnosis not present

## 2015-05-23 DIAGNOSIS — M1990 Unspecified osteoarthritis, unspecified site: Secondary | ICD-10-CM | POA: Diagnosis not present

## 2015-05-23 DIAGNOSIS — I10 Essential (primary) hypertension: Secondary | ICD-10-CM | POA: Diagnosis not present

## 2015-05-23 DIAGNOSIS — E785 Hyperlipidemia, unspecified: Secondary | ICD-10-CM | POA: Diagnosis not present

## 2015-05-25 DIAGNOSIS — Z4789 Encounter for other orthopedic aftercare: Secondary | ICD-10-CM | POA: Diagnosis not present

## 2015-05-25 DIAGNOSIS — E039 Hypothyroidism, unspecified: Secondary | ICD-10-CM | POA: Diagnosis not present

## 2015-05-25 DIAGNOSIS — E785 Hyperlipidemia, unspecified: Secondary | ICD-10-CM | POA: Diagnosis not present

## 2015-05-25 DIAGNOSIS — M1990 Unspecified osteoarthritis, unspecified site: Secondary | ICD-10-CM | POA: Diagnosis not present

## 2015-05-25 DIAGNOSIS — K219 Gastro-esophageal reflux disease without esophagitis: Secondary | ICD-10-CM | POA: Diagnosis not present

## 2015-05-25 DIAGNOSIS — I10 Essential (primary) hypertension: Secondary | ICD-10-CM | POA: Diagnosis not present

## 2015-05-27 DIAGNOSIS — E039 Hypothyroidism, unspecified: Secondary | ICD-10-CM | POA: Diagnosis not present

## 2015-05-27 DIAGNOSIS — I10 Essential (primary) hypertension: Secondary | ICD-10-CM | POA: Diagnosis not present

## 2015-05-27 DIAGNOSIS — M1990 Unspecified osteoarthritis, unspecified site: Secondary | ICD-10-CM | POA: Diagnosis not present

## 2015-05-27 DIAGNOSIS — Z4789 Encounter for other orthopedic aftercare: Secondary | ICD-10-CM | POA: Diagnosis not present

## 2015-05-27 DIAGNOSIS — K219 Gastro-esophageal reflux disease without esophagitis: Secondary | ICD-10-CM | POA: Diagnosis not present

## 2015-05-27 DIAGNOSIS — E785 Hyperlipidemia, unspecified: Secondary | ICD-10-CM | POA: Diagnosis not present

## 2015-05-31 DIAGNOSIS — E785 Hyperlipidemia, unspecified: Secondary | ICD-10-CM | POA: Diagnosis not present

## 2015-05-31 DIAGNOSIS — M1990 Unspecified osteoarthritis, unspecified site: Secondary | ICD-10-CM | POA: Diagnosis not present

## 2015-05-31 DIAGNOSIS — K219 Gastro-esophageal reflux disease without esophagitis: Secondary | ICD-10-CM | POA: Diagnosis not present

## 2015-05-31 DIAGNOSIS — E039 Hypothyroidism, unspecified: Secondary | ICD-10-CM | POA: Diagnosis not present

## 2015-05-31 DIAGNOSIS — Z4789 Encounter for other orthopedic aftercare: Secondary | ICD-10-CM | POA: Diagnosis not present

## 2015-05-31 DIAGNOSIS — I10 Essential (primary) hypertension: Secondary | ICD-10-CM | POA: Diagnosis not present

## 2015-06-03 DIAGNOSIS — K219 Gastro-esophageal reflux disease without esophagitis: Secondary | ICD-10-CM | POA: Diagnosis not present

## 2015-06-03 DIAGNOSIS — I10 Essential (primary) hypertension: Secondary | ICD-10-CM | POA: Diagnosis not present

## 2015-06-03 DIAGNOSIS — E785 Hyperlipidemia, unspecified: Secondary | ICD-10-CM | POA: Diagnosis not present

## 2015-06-03 DIAGNOSIS — E039 Hypothyroidism, unspecified: Secondary | ICD-10-CM | POA: Diagnosis not present

## 2015-06-03 DIAGNOSIS — Z4789 Encounter for other orthopedic aftercare: Secondary | ICD-10-CM | POA: Diagnosis not present

## 2015-06-03 DIAGNOSIS — M1990 Unspecified osteoarthritis, unspecified site: Secondary | ICD-10-CM | POA: Diagnosis not present

## 2015-06-08 DIAGNOSIS — E039 Hypothyroidism, unspecified: Secondary | ICD-10-CM | POA: Diagnosis not present

## 2015-06-08 DIAGNOSIS — M1990 Unspecified osteoarthritis, unspecified site: Secondary | ICD-10-CM | POA: Diagnosis not present

## 2015-06-08 DIAGNOSIS — I10 Essential (primary) hypertension: Secondary | ICD-10-CM | POA: Diagnosis not present

## 2015-06-08 DIAGNOSIS — K219 Gastro-esophageal reflux disease without esophagitis: Secondary | ICD-10-CM | POA: Diagnosis not present

## 2015-06-08 DIAGNOSIS — Z4789 Encounter for other orthopedic aftercare: Secondary | ICD-10-CM | POA: Diagnosis not present

## 2015-06-08 DIAGNOSIS — E785 Hyperlipidemia, unspecified: Secondary | ICD-10-CM | POA: Diagnosis not present

## 2015-06-20 ENCOUNTER — Other Ambulatory Visit: Payer: Self-pay | Admitting: Family Medicine

## 2015-06-22 ENCOUNTER — Other Ambulatory Visit: Payer: Self-pay | Admitting: Family Medicine

## 2015-06-22 NOTE — Telephone Encounter (Signed)
Refill called to [facility] instead of printed

## 2015-06-29 ENCOUNTER — Other Ambulatory Visit: Payer: Self-pay | Admitting: Family Medicine

## 2015-06-29 ENCOUNTER — Ambulatory Visit: Payer: Medicare Other | Admitting: Family Medicine

## 2015-06-29 DIAGNOSIS — M4316 Spondylolisthesis, lumbar region: Secondary | ICD-10-CM | POA: Diagnosis not present

## 2015-07-14 ENCOUNTER — Ambulatory Visit: Payer: Self-pay | Admitting: Family Medicine

## 2015-07-23 ENCOUNTER — Other Ambulatory Visit: Payer: Self-pay | Admitting: Family Medicine

## 2015-07-25 NOTE — Telephone Encounter (Signed)
Last seen 12/24/14 DWM    If approved route to nurse to call into [facility]

## 2015-07-26 ENCOUNTER — Telehealth: Payer: Self-pay

## 2015-07-26 NOTE — Telephone Encounter (Signed)
Insurance prior authorized Zolpidem

## 2015-07-27 DIAGNOSIS — M7062 Trochanteric bursitis, left hip: Secondary | ICD-10-CM | POA: Diagnosis not present

## 2015-07-29 ENCOUNTER — Other Ambulatory Visit: Payer: Self-pay | Admitting: Family Medicine

## 2015-07-29 NOTE — Telephone Encounter (Signed)
Last seen 12/24/14  DWM

## 2015-08-12 ENCOUNTER — Encounter: Payer: Self-pay | Admitting: Family Medicine

## 2015-08-12 ENCOUNTER — Other Ambulatory Visit: Payer: Self-pay | Admitting: Family Medicine

## 2015-08-12 ENCOUNTER — Other Ambulatory Visit: Payer: Self-pay | Admitting: *Deleted

## 2015-08-12 ENCOUNTER — Ambulatory Visit (INDEPENDENT_AMBULATORY_CARE_PROVIDER_SITE_OTHER): Payer: Medicare Other | Admitting: Family Medicine

## 2015-08-12 VITALS — BP 120/67 | HR 78 | Temp 97.6°F | Ht 65.0 in | Wt 151.0 lb

## 2015-08-12 DIAGNOSIS — E785 Hyperlipidemia, unspecified: Secondary | ICD-10-CM

## 2015-08-12 DIAGNOSIS — E559 Vitamin D deficiency, unspecified: Secondary | ICD-10-CM | POA: Diagnosis not present

## 2015-08-12 DIAGNOSIS — M5126 Other intervertebral disc displacement, lumbar region: Secondary | ICD-10-CM | POA: Diagnosis not present

## 2015-08-12 DIAGNOSIS — D696 Thrombocytopenia, unspecified: Secondary | ICD-10-CM

## 2015-08-12 DIAGNOSIS — E039 Hypothyroidism, unspecified: Secondary | ICD-10-CM

## 2015-08-12 DIAGNOSIS — G629 Polyneuropathy, unspecified: Secondary | ICD-10-CM

## 2015-08-12 DIAGNOSIS — G4700 Insomnia, unspecified: Secondary | ICD-10-CM | POA: Diagnosis not present

## 2015-08-12 DIAGNOSIS — I10 Essential (primary) hypertension: Secondary | ICD-10-CM

## 2015-08-12 DIAGNOSIS — F5104 Psychophysiologic insomnia: Secondary | ICD-10-CM

## 2015-08-12 MED ORDER — HYDROCODONE-ACETAMINOPHEN 5-325 MG PO TABS: 1.0000 | ORAL_TABLET | Freq: Four times a day (QID) | ORAL | Status: DC | PRN

## 2015-08-12 NOTE — Patient Instructions (Addendum)
Medicare Annual Wellness Visit  [facility] and the medical providers at [facility] strive to bring you the best medical care.  In doing so we not only want to address your current medical conditions and concerns but also to detect new conditions early and prevent illness, disease and health-related problems.    Medicare offers a yearly Wellness Visit which allows our clinical staff to assess your need for preventative services including immunizations, lifestyle education, counseling to decrease risk of preventable diseases and screening for fall risk and other medical concerns.    This visit is provided free of charge (no copay) for all Medicare recipients. The clinical pharmacists at [facility] have begun to conduct these Wellness Visits which will also include a thorough review of all your medications.    As you primary medical provider recommend that you make an appointment for your Annual Wellness Visit if you have not done so already this year.  You may set up this appointment before you leave today or you may call back [phone]) and schedule an appointment.  Please make sure when you call that you mention that you are scheduling your Annual Wellness Visit with the clinical pharmacist so that the appointment may be made for the proper length of time.     Continue current medications. Continue good therapeutic lifestyle changes which include good diet and exercise. Fall precautions discussed with patient. If an FOBT was given today- please return it to our front desk. If you are over 80 years old - you may need Prevnar 67 or the adult Pneumonia vaccine.  **Flu shots are available--- please call and schedule a FLU-CLINIC appointment**  After your visit with [place] today you will receive a survey in the mail or online from [employer] regarding your care with [place]. Please take a moment to fill this out. Your feedback is very  important to [place] as you can help [place] better understand your patient needs as well as improve your experience and satisfaction. WE CARE ABOUT YOU!!!   We will call the orthopedic surgeon and see what kind of pain medicine that he recommends for you to have between now and the time he see him next week We will call your son and have him to come by and pick up the prescription to get it filled for you Please follow-up with orthopedic surgeon as planned Continue other medications that you're currently taking Try taking Zantac or ranitidine twice daily before breakfast and supper for your nausea and heartburn. Take it more regularly. Continue to drink plenty of fluids and stay well hydrated. This is very important.

## 2015-08-12 NOTE — Progress Notes (Signed)
Subjective:    Patient ID: [name], female    DOB: 08-Jun-1925, 82 y.o.   MRN: [id number]  HPI Pt here for follow up and management of chronic medical problems which includes hypothyroid, hyperlipidemia, and hypertension. She is taking medications regularly.In March of this year that this patient had a microdiscectomy at L3 and 4 by the orthopedic surgeon. Prior to that she had a cardiac evaluation to make sure that surgery was okay and surgery was okay according to the cardiologist. She does have a right bundle branch block and a left anterior fascicular block. She came home from the hospital from her surgery and was doing well with home physical therapy. Apparently she was walking on some gravel and hurt her back again hands has been in severe pain since that time and has a plan for follow-up with the surgeon soon. She would like to have something stronger for pain. We will discuss this with the surgeon and see if he cannot call this medication in for her or tell us what we can write so that she can have some relief of the pain prior to her seeing him for possible injection. The patient denies chest pain or shortness of breath. She does have some nausea and heartburn. She is not been taking the Prilosec or Zantac on a regular basis. She denies any blood in the stool or black tarry bowel movements. Just constipation and she uses milk of magnesia for this. She also has been taking some MiraLAX and I encouraged her to use more of this than the milk of magnesia as this was not as harsh on her system. She's been taking pain medication Norco 5/325.     Patient Active Problem List   Diagnosis Date Noted  . HNP (herniated nucleus pulposus), cervical 05/16/2015    Class: Chronic  . Herniated nucleus pulposus, lumbar 05/16/2015  . Abnormal EKG 04/21/2015  . Thrombocytopenia ([facility]) 08/18/2014  . Insomnia 11/24/2013  . Impingement syndrome of left shoulder 09/18/2013  . S/P arthroscopy of shoulder  09/18/2013  . Low back pain 03/17/2013  . Neuropathy ([place]) 03/17/2013  . Metabolic syndrome 84/16/6063  . Overweight (BMI 25.0-29.9) 03/17/2013  . Vitamin D deficiency 12/01/2012  . Chronic insomnia 12/01/2012  . Fibrocystic breast changes 05/21/2011  . Essential hypertension   . Hyperlipidemia   . Peptic ulcer disease   . ITP (idiopathic thrombocytopenic purpura)   . Rhinitis   . Elevated blood sugar   . Hypothyroidism   . Goiter   . Colon polyps   . Adhesion of abdominal wall   . Esophageal stricture    Outpatient Encounter Prescriptions as of 08/12/2015  Medication Sig  . atorvastatin (LIPITOR) 80 MG tablet TAKE 1 TABLET ONCE A DAY  . calcium citrate-vitamin D (CITRACAL+D) 315-200 MG-UNIT per tablet Take 1 tablet by mouth daily.  . Cholecalciferol (VITAMIN D3) 2000 UNITS TABS Take 1 tablet by mouth daily.    Marland Kitchen HYDROcodone-acetaminophen (NORCO) 7.5-325 MG tablet Take 1 tablet by mouth 2 (two) times daily. Take 1 tab by mouth twice a day  . levothyroxine (LEVOTHROID) 25 MCG tablet Take 1/2 tablet (12.55mg) daily.  Along with 1526m of Synthroid  . levothyroxine (SYNTHROID, LEVOTHROID) 150 MCG tablet TAKE 1 TABLET DAILY  . methocarbamol (ROBAXIN) 500 MG tablet Take 1 tablet (500 mg total) by mouth every 8 (eight) hours as needed for muscle spasms.  . Multiple Vitamin (MULTIVITAMIN WITH MINERALS) TABS tablet Take 1 tablet by mouth daily.  . Marland Kitchenmeprazole (  PRILOSEC) 20 MG capsule Take 20 mg by mouth daily.  . polyvinyl alcohol (LIQUIFILM TEARS) 1.4 % ophthalmic solution Place 1 drop into both eyes as needed for dry eyes.  . valsartan-hydrochlorothiazide (DIOVAN-HCT) 320-25 MG tablet TAKE 1 TABLET DAILY  . zolpidem (AMBIEN) 10 MG tablet TAKE 1 TABLET AT BEDTIME AS NEEDED FOR SLEEP  . [DISCONTINUED] HYDROcodone-acetaminophen (NORCO/VICODIN) 5-325 MG tablet Take 1-2 tablets by mouth every 4 (four) hours as needed for moderate pain (mild pain).   No facility-administered encounter  medications on file as of 08/12/2015.      Review of Systems  Constitutional: Negative.   HENT: Negative.   Eyes: Negative.   Respiratory: Negative.   Cardiovascular: Negative.   Gastrointestinal: Positive for abdominal pain ("scar tissue").  Endocrine: Negative.   Genitourinary: Negative.   Musculoskeletal: Positive for arthralgias (bilateral hip pain).  Skin: Negative.   Allergic/Immunologic: Negative.   Neurological: Negative.   Hematological: Negative.   Psychiatric/Behavioral: Negative.        Objective:   Physical Exam  Constitutional: She is oriented to person, place, and time. She appears well-developed and well-nourished. She appears distressed.  The patient is pleasant and alert. She is not a complainer. She is obviously in a lot of pain in her low back and right hip at the present time.  HENT:  Head: Normocephalic and atraumatic.  Right Ear: External ear normal.  Left Ear: External ear normal.  Nose: Nose normal.  Mouth/Throat: Oropharynx is clear and moist.  Eyes: Conjunctivae and EOM are normal. Pupils are equal, round, and reactive to light. Right eye exhibits no discharge. Left eye exhibits no discharge. No scleral icterus.  Neck: Normal range of motion. Neck supple. No thyromegaly present.  No thyromegaly or bruits  Cardiovascular: Normal rate, regular rhythm and normal heart sounds.   No murmur heard. Regular rate and rhythm at 72/m  Pulmonary/Chest: Effort normal and breath sounds normal. No respiratory distress. She has no wheezes. She has no rales. She exhibits no tenderness.  Clear anteriorly and posteriorly  Abdominal: Soft. Bowel sounds are normal. She exhibits no mass. There is no tenderness. There is no rebound and no guarding.  No abdominal tenderness  Musculoskeletal: She exhibits no edema or tenderness.  Range of motion is very hesitant due to pain and back and hip. She is using a cane for ambulation.  Lymphadenopathy:    She has no cervical  adenopathy.  Neurological: She is alert and oriented to person, place, and time. No cranial nerve deficit.  The lower extremity reflexes on the right are slightly diminished compared to the left.  Skin: Skin is warm and dry. No rash noted.  Psychiatric: She has a normal mood and affect. Her behavior is normal. Judgment and thought content normal.  Nursing note and vitals reviewed.  BP 120/67 mmHg  Pulse 78  Temp(Src) 97.6 F (36.4 C) (Oral)  Ht 5' 5"  (1.651 m)  Wt 151 lb (68.493 kg)  BMI 25.13 kg/m2        Assessment & Plan:  1. Hypothyroidism, unspecified hypothyroidism type -Continue with current treatment pending results of lab work - CBC with Differential/Platelet - Thyroid Panel With TSH  2. Essential hypertension -The blood pressure is good today and she will continue with current treatment - BMP8+EGFR - CBC with Differential/Platelet - Hepatic function panel  3. Vitamin D deficiency -Continue with current treatment pending results of lab work - CBC with Differential/Platelet - VITAMIN D 25 Hydroxy (Vit-D Deficiency, Fractures)  4. Hyperlipidemia -  Continue with atorvastatin pending results of lab work - CBC with Differential/Platelet - NMR, lipoprofile  5. Thrombocytopenia (HCC) -No signs of any bleeding or history of any bleeding. - CBC with Differential/Platelet  6. Herniated nucleus pulposus, lumbar -The patient is status post an L3-L4 discectomy. She did well for a good period of time but then developed recurrent pain in her back and hip area. She has a follow-up with orthopedic surgeon sometime next week for possible injection to help control the pain better. We will have a discussion with orthopedic surgeon today about her pain and what we can give her that to be stronger with his approval.  7. Neuropathy ([place]) -Follow-up with orthopedics as planned  8. Chronic insomnia -Continue current treatment for now.  No orders of the defined types were placed  in this encounter.   Patient Instructions                       Medicare Annual Wellness Visit  [facility] and the medical providers at [facility] strive to bring you the best medical care.  In doing so we not only want to address your current medical conditions and concerns but also to detect new conditions early and prevent illness, disease and health-related problems.    Medicare offers a yearly Wellness Visit which allows our clinical staff to assess your need for preventative services including immunizations, lifestyle education, counseling to decrease risk of preventable diseases and screening for fall risk and other medical concerns.    This visit is provided free of charge (no copay) for all Medicare recipients. The clinical pharmacists at [facility] have begun to conduct these Wellness Visits which will also include a thorough review of all your medications.    As you primary medical provider recommend that you make an appointment for your Annual Wellness Visit if you have not done so already this year.  You may set up this appointment before you leave today or you may call back ([phone]) and schedule an appointment.  Please make sure when you call that you mention that you are scheduling your Annual Wellness Visit with the clinical pharmacist so that the appointment may be made for the proper length of time.     Continue current medications. Continue good therapeutic lifestyle changes which include good diet and exercise. Fall precautions discussed with patient. If an FOBT was given today- please return it to our front desk. If you are over 72 years old - you may need Prevnar 20 or the adult Pneumonia vaccine.  **Flu shots are available--- please call and schedule a FLU-CLINIC appointment**  After your visit with [place] today you will receive a survey in the mail or online from [employer] regarding your care with [place]. Please take a moment  to fill this out. Your feedback is very important to [place] as you can help [place] better understand your patient needs as well as improve your experience and satisfaction. WE CARE ABOUT YOU!!!   We will call the orthopedic surgeon and see what kind of pain medicine that he recommends for you to have between now and the time he see him next week We will call your son and have him to come by and pick up the prescription to get it filled for you Please follow-up with orthopedic surgeon as planned Continue other medications that you're currently taking Try taking Zantac or ranitidine twice daily before breakfast and supper for your nausea  and heartburn. Take it more regularly. Continue to drink plenty of fluids and stay well hydrated. This is very important.   Arrie Senate MD

## 2015-08-12 NOTE — Progress Notes (Signed)
Dr Louanne Skye called and he agrees that pt needs more pain meds - #20 approved by him - for [facility] to write for the pt.  He will also try and get epidural moved up and a appt with him in sooner.

## 2015-08-13 LAB — BMP8+EGFR
BUN / CREAT RATIO: 19 (ref 12–28)
BUN: 17 mg/dL (ref 8–27)
CALCIUM: 9.7 mg/dL (ref 8.7–10.3)
CHLORIDE: 99 mmol/L (ref 96–106)
CO2: 25 mmol/L (ref 18–29)
Creatinine, Ser: 0.88 mg/dL (ref 0.57–1.00)
GFR calc non Af Amer: 59 mL/min/{1.73_m2} — ABNORMAL LOW (ref >59)
GFR, EST AFRICAN AMERICAN: 68 mL/min/{1.73_m2} (ref >59)
GLUCOSE: 106 mg/dL — AB (ref 65–99)
Potassium: 3.8 mmol/L (ref 3.5–5.2)
Sodium: 143 mmol/L (ref 134–144)

## 2015-08-13 LAB — CBC WITH DIFFERENTIAL/PLATELET
BASOS ABS: 0.1 10*3/uL (ref 0.0–0.2)
Basos: 1 %
EOS (ABSOLUTE): 0.3 10*3/uL (ref 0.0–0.4)
Eos: 3 %
HEMOGLOBIN: 13.3 g/dL (ref 11.1–15.9)
Hematocrit: 39.6 % (ref 34.0–46.6)
Immature Grans (Abs): 0.1 10*3/uL (ref 0.0–0.1)
Immature Granulocytes: 1 %
LYMPHS ABS: 2.8 10*3/uL (ref 0.7–3.1)
Lymphs: 28 %
MCH: 30.6 pg (ref 26.6–33.0)
MCHC: 33.6 g/dL (ref 31.5–35.7)
MCV: 91 fL (ref 79–97)
MONOCYTES: 4 %
MONOS ABS: 0.5 10*3/uL (ref 0.1–0.9)
NEUTROS ABS: 6.7 10*3/uL (ref 1.4–7.0)
Neutrophils: 63 %
PLATELETS: 145 10*3/uL — AB (ref 150–379)
RBC: 4.34 x10E6/uL (ref 3.77–5.28)
RDW: 13.4 % (ref 12.3–15.4)
WBC: 10.3 10*3/uL (ref 3.4–10.8)

## 2015-08-13 LAB — NMR, LIPOPROFILE
CHOLESTEROL: 133 mg/dL (ref 100–199)
HDL Cholesterol by NMR: 68 mg/dL (ref >39)
HDL Particle Number: 40.9 umol/L (ref >=30.5)
LDL Particle Number: 687 nmol/L (ref <1000)
LDL Size: 20.9 nm (ref >20.5)
LDL-C: 45 mg/dL (ref 0–99)
LP-IR Score: 44 (ref <=45)
Small LDL Particle Number: 433 nmol/L (ref <=527)
TRIGLYCERIDES BY NMR: 100 mg/dL (ref 0–149)

## 2015-08-13 LAB — HEPATIC FUNCTION PANEL
ALBUMIN: 4.1 g/dL (ref 3.5–4.7)
ALK PHOS: 103 IU/L (ref 39–117)
ALT: 6 IU/L (ref 0–32)
AST: 14 IU/L (ref 0–40)
BILIRUBIN TOTAL: 0.6 mg/dL (ref 0.0–1.2)
Bilirubin, Direct: 0.2 mg/dL (ref 0.00–0.40)
TOTAL PROTEIN: 7.5 g/dL (ref 6.0–8.5)

## 2015-08-13 LAB — THYROID PANEL WITH TSH
FREE THYROXINE INDEX: 2.4 (ref 1.2–4.9)
T3 UPTAKE RATIO: 31 % (ref 24–39)
T4 TOTAL: 7.7 ug/dL (ref 4.5–12.0)
TSH: 0.347 u[IU]/mL — ABNORMAL LOW (ref 0.450–4.500)

## 2015-08-13 LAB — VITAMIN D 25 HYDROXY (VIT D DEFICIENCY, FRACTURES): Vit D, 25-Hydroxy: 44.3 ng/mL (ref 30.0–100.0)

## 2015-08-15 ENCOUNTER — Other Ambulatory Visit: Payer: Self-pay | Admitting: Specialist

## 2015-08-15 DIAGNOSIS — M545 Low back pain, unspecified: Secondary | ICD-10-CM

## 2015-08-15 DIAGNOSIS — G8929 Other chronic pain: Secondary | ICD-10-CM

## 2015-08-15 NOTE — Addendum Note (Signed)
Addended by: Wardell Heath on: 08/15/2015 03:11 PM   Modules accepted: Orders

## 2015-08-22 ENCOUNTER — Other Ambulatory Visit: Payer: Self-pay | Admitting: Specialist

## 2015-08-22 ENCOUNTER — Ambulatory Visit
Admission: RE | Admit: 2015-08-22 | Discharge: 2015-08-22 | Disposition: A | Discharge status: Home / Self Care | Payer: Medicare Other | Source: Ambulatory Visit | Attending: Specialist | Admitting: Specialist

## 2015-08-22 DIAGNOSIS — M47817 Spondylosis without myelopathy or radiculopathy, lumbosacral region: Secondary | ICD-10-CM | POA: Diagnosis not present

## 2015-08-22 DIAGNOSIS — M545 Low back pain, unspecified: Secondary | ICD-10-CM

## 2015-08-22 DIAGNOSIS — G8929 Other chronic pain: Secondary | ICD-10-CM

## 2015-08-22 MED ORDER — IOPAMIDOL (ISOVUE-M 200) INJECTION 41%
1.0000 mL | Freq: Once | INTRAMUSCULAR | Status: AC
  Administered 2015-08-22: 1 mL via EPIDURAL

## 2015-08-22 MED ORDER — METHYLPREDNISOLONE ACETATE 40 MG/ML INJ SUSP (RADIOLOG
120.0000 mg | Freq: Once | INTRAMUSCULAR | Status: AC
  Administered 2015-08-22: 120 mg via EPIDURAL

## 2015-08-22 NOTE — Discharge Instructions (Signed)

## 2015-08-26 ENCOUNTER — Other Ambulatory Visit: Payer: Self-pay | Admitting: Family Medicine

## 2015-08-31 ENCOUNTER — Other Ambulatory Visit: Payer: Self-pay | Admitting: Family Medicine

## 2015-09-07 DIAGNOSIS — M4316 Spondylolisthesis, lumbar region: Secondary | ICD-10-CM | POA: Diagnosis not present

## 2015-09-07 DIAGNOSIS — M7062 Trochanteric bursitis, left hip: Secondary | ICD-10-CM | POA: Diagnosis not present

## 2015-09-07 DIAGNOSIS — M4806 Spinal stenosis, lumbar region: Secondary | ICD-10-CM | POA: Diagnosis not present

## 2015-09-07 DIAGNOSIS — M7061 Trochanteric bursitis, right hip: Secondary | ICD-10-CM | POA: Diagnosis not present

## 2015-09-08 ENCOUNTER — Other Ambulatory Visit: Payer: Self-pay | Admitting: Specialist

## 2015-09-08 DIAGNOSIS — M545 Low back pain: Secondary | ICD-10-CM

## 2015-09-20 ENCOUNTER — Ambulatory Visit
Admission: RE | Admit: 2015-09-20 | Discharge: 2015-09-20 | Disposition: A | Discharge status: Home / Self Care | Payer: Medicare Other | Source: Ambulatory Visit | Attending: Specialist | Admitting: Specialist

## 2015-09-20 DIAGNOSIS — M5126 Other intervertebral disc displacement, lumbar region: Secondary | ICD-10-CM | POA: Diagnosis not present

## 2015-09-20 DIAGNOSIS — M545 Low back pain: Secondary | ICD-10-CM

## 2015-09-24 ENCOUNTER — Other Ambulatory Visit: Payer: Self-pay | Admitting: Family Medicine

## 2015-09-26 NOTE — Telephone Encounter (Signed)
Last filled 08/25/15, last seen 08/12/15. Call in at [facility]

## 2015-09-28 DIAGNOSIS — M4806 Spinal stenosis, lumbar region: Secondary | ICD-10-CM | POA: Diagnosis not present

## 2015-09-28 DIAGNOSIS — M4316 Spondylolisthesis, lumbar region: Secondary | ICD-10-CM | POA: Diagnosis not present

## 2015-10-26 ENCOUNTER — Ambulatory Visit (INDEPENDENT_AMBULATORY_CARE_PROVIDER_SITE_OTHER): Payer: Medicare Other

## 2015-10-26 DIAGNOSIS — Z780 Asymptomatic menopausal state: Secondary | ICD-10-CM | POA: Diagnosis not present

## 2015-10-27 DIAGNOSIS — S322XXA Fracture of coccyx, initial encounter for closed fracture: Secondary | ICD-10-CM | POA: Diagnosis not present

## 2015-11-01 ENCOUNTER — Other Ambulatory Visit: Payer: Medicare Other

## 2015-11-15 ENCOUNTER — Other Ambulatory Visit: Payer: Self-pay | Admitting: Family Medicine

## 2015-11-17 ENCOUNTER — Ambulatory Visit (INDEPENDENT_AMBULATORY_CARE_PROVIDER_SITE_OTHER): Payer: Medicare Other | Admitting: Nurse Practitioner

## 2015-11-17 VITALS — BP 125/64 | HR 71 | Temp 97.5°F | Ht 64.0 in | Wt 162.0 lb

## 2015-11-17 DIAGNOSIS — S0101XA Laceration without foreign body of scalp, initial encounter: Secondary | ICD-10-CM | POA: Diagnosis not present

## 2015-11-17 NOTE — Patient Instructions (Signed)

## 2015-11-17 NOTE — Progress Notes (Signed)
   Subjective:    Patient ID: [name], female    DOB: [birth year], 82 y.o.   MRN: [id number]  HPI Patient comes in with laceration to left scalp area. She tried at home and hit door facing with her head causing laceration. She denies loosing conscienceless. No headache dizziness or blurred vision.    Review of Systems  Constitutional: Negative.   HENT: Negative.   Respiratory: Negative.   Cardiovascular: Negative.   Gastrointestinal: Negative.   Genitourinary: Negative.   Skin: Positive for wound.  Neurological: Negative.   Psychiatric/Behavioral: Negative.   All other systems reviewed and are negative.      Objective:   Physical Exam  Constitutional: She is oriented to person, place, and time. She appears well-developed and well-nourished. No distress.  Eyes: EOM are normal. Pupils are equal, round, and reactive to light.  Cardiovascular: Normal rate and normal heart sounds.   Pulmonary/Chest: Effort normal and breath sounds normal.  Neurological: She is alert and oriented to person, place, and time. She has normal reflexes.  Skin:  2.5 cm laceration to left scalp area    Procedure- lidocaine 1% with epi 1.5 cc local  Wound cleaned with betadine  5 staples inserted  Cleaned with saline and antibiotic ointment applied     Assessment & Plan:   1. Scalp laceration, initial encounter    Keep wound clean and dry Staples removed in 10 days Signs of ICP discussed RTO prn  Mary-Margaret Hassell Done, FNP

## 2015-11-18 ENCOUNTER — Other Ambulatory Visit: Payer: Self-pay | Admitting: Family Medicine

## 2015-11-18 NOTE — Telephone Encounter (Signed)
Last filled 10/22/15. Please review and if approved route to pool for nurse to call into pharmacy.

## 2015-11-21 NOTE — Telephone Encounter (Signed)
rx called into pharmacy

## 2015-11-23 ENCOUNTER — Encounter: Payer: Self-pay | Admitting: Family Medicine

## 2015-11-23 ENCOUNTER — Ambulatory Visit (INDEPENDENT_AMBULATORY_CARE_PROVIDER_SITE_OTHER): Payer: Medicare Other | Admitting: Family Medicine

## 2015-11-23 VITALS — BP 118/66 | HR 73 | Temp 97.4°F | Ht 64.0 in | Wt 144.0 lb

## 2015-11-23 DIAGNOSIS — I10 Essential (primary) hypertension: Secondary | ICD-10-CM | POA: Diagnosis not present

## 2015-11-23 DIAGNOSIS — M542 Cervicalgia: Secondary | ICD-10-CM

## 2015-11-23 DIAGNOSIS — M5126 Other intervertebral disc displacement, lumbar region: Secondary | ICD-10-CM | POA: Diagnosis not present

## 2015-11-23 DIAGNOSIS — Z23 Encounter for immunization: Secondary | ICD-10-CM | POA: Diagnosis not present

## 2015-11-23 DIAGNOSIS — E039 Hypothyroidism, unspecified: Secondary | ICD-10-CM

## 2015-11-23 DIAGNOSIS — E785 Hyperlipidemia, unspecified: Secondary | ICD-10-CM

## 2015-11-23 DIAGNOSIS — D696 Thrombocytopenia, unspecified: Secondary | ICD-10-CM | POA: Diagnosis not present

## 2015-11-23 DIAGNOSIS — E559 Vitamin D deficiency, unspecified: Secondary | ICD-10-CM | POA: Diagnosis not present

## 2015-11-23 DIAGNOSIS — G629 Polyneuropathy, unspecified: Secondary | ICD-10-CM | POA: Diagnosis not present

## 2015-11-23 DIAGNOSIS — S0101XD Laceration without foreign body of scalp, subsequent encounter: Secondary | ICD-10-CM | POA: Diagnosis not present

## 2015-11-23 DIAGNOSIS — E875 Hyperkalemia: Secondary | ICD-10-CM

## 2015-11-23 MED ORDER — TRAMADOL HCL 50 MG PO TABS: ORAL_TABLET | ORAL | 0 refills | Status: DC

## 2015-11-23 MED ORDER — ZOLPIDEM TARTRATE 10 MG PO TABS: 10.0000 mg | ORAL_TABLET | Freq: Every evening | ORAL | 3 refills | Status: DC | PRN

## 2015-11-23 NOTE — Progress Notes (Signed)
Subjective:    Patient ID: [name], female    DOB: [birth year], 82 y.o.   MRN: [id number]  HPI Pt here for follow up and management of chronic medical problems which includes hypothyroid, hyperlipidemia and hypertension. She is taking medications regularly.This patient has had several things going on with her over the past several months. The biggest concern was her microdiscectomy which was done in March. She has a history of hypothyroidism. She also has thrombocytopenia. She takes Ambien for her insomnia. The patient recently fell and has staples present on her posterior scalp. She bent over to remove a lizard and lost her balance and fell and hit the back of her head. The staples will be removed today and the area appears to be healing well. This patient does continue to have ongoing back pain from her disc surgery back in the spring. There are indications that she may need more surgery and she is questioning whether she will to do that or not. The patient denies any chest pain or shortness of breath. She denies any problems with her intestinal track as far as heartburn indigestion nausea vomiting diarrhea or blood in the stool. She is passing her water without problems. She has had some increased low back pain since she recently fell and had the staples put in. She says this is getting better. She is alert. She comes to the visit today with her son.     Patient Active Problem List   Diagnosis Date Noted  . HNP (herniated nucleus pulposus), cervical 05/16/2015    Class: Chronic  . Herniated nucleus pulposus, lumbar 05/16/2015  . Abnormal EKG 04/21/2015  . Thrombocytopenia ([place]) 08/18/2014  . Insomnia 11/24/2013  . Impingement syndrome of left shoulder 09/18/2013  . S/P arthroscopy of shoulder 09/18/2013  . Low back pain 03/17/2013  . Neuropathy ([place]) 03/17/2013  . Metabolic syndrome 32/44/0102  . Overweight (BMI 25.0-29.9) 03/17/2013  . Vitamin D deficiency 12/01/2012  .  Chronic insomnia 12/01/2012  . Fibrocystic breast changes 05/21/2011  . Essential hypertension   . Hyperlipidemia   . Peptic ulcer disease   . ITP (idiopathic thrombocytopenic purpura)   . Rhinitis   . Elevated blood sugar   . Hypothyroidism   . Goiter   . Colon polyps   . Adhesion of abdominal wall   . Esophageal stricture    Outpatient Encounter Prescriptions as of 11/23/2015  Medication Sig  . atorvastatin (LIPITOR) 80 MG tablet TAKE 1 TABLET ONCE A DAY  . calcium citrate-vitamin D (CITRACAL+D) 315-200 MG-UNIT per tablet Take 1 tablet by mouth daily.  . Cholecalciferol (VITAMIN D3) 2000 UNITS TABS Take 1 tablet by mouth daily.    Marland Kitchen HYDROcodone-acetaminophen (NORCO) 5-325 MG tablet Take 1-2 tablets by mouth every 6 (six) hours as needed for moderate pain.  Marland Kitchen levothyroxine (LEVOTHROID) 25 MCG tablet Take 1/2 tablet (12.55mg) daily.  Along with 1564m of Synthroid  . levothyroxine (SYNTHROID, LEVOTHROID) 150 MCG tablet TAKE 1 TABLET DAILY  . methocarbamol (ROBAXIN) 500 MG tablet Take 1 tablet (500 mg total) by mouth every 8 (eight) hours as needed for muscle spasms.  . Multiple Vitamin (MULTIVITAMIN WITH MINERALS) TABS tablet Take 1 tablet by mouth daily.  . Marland Kitchenmeprazole (PRILOSEC) 20 MG capsule Take 20 mg by mouth daily.  . polyvinyl alcohol (LIQUIFILM TEARS) 1.4 % ophthalmic solution Place 1 drop into both eyes as needed for dry eyes.  . valsartan-hydrochlorothiazide (DIOVAN-HCT) 320-25 MG tablet TAKE 1 TABLET DAILY  . zolpidem (AMBIEN) 10  MG tablet TAKE 1 TABLET AT BEDTIME AS NEEDED FOR SLEEP   No facility-administered encounter medications on file as of 11/23/2015.       Review of Systems  Constitutional: Negative.        Trouble sleeping  HENT: Negative.   Eyes: Negative.   Respiratory: Negative.   Cardiovascular: Negative.   Gastrointestinal: Negative.   Endocrine: Negative.   Genitourinary: Negative.   Musculoskeletal: Negative.   Skin: Positive for wound (head - remove  staples today ).  Allergic/Immunologic: Negative.   Neurological: Negative.   Hematological: Negative.   Psychiatric/Behavioral: Negative.        Objective:   Physical Exam  Constitutional: She is oriented to person, place, and time. She appears well-nourished.  The patient is pleasant and alert and somewhat pale in color.  HENT:  Head: Normocephalic.  Right Ear: External ear normal.  Left Ear: External ear normal.  Nose: Nose normal.  Mouth/Throat: Oropharynx is clear and moist.  The occipital scalp laceration appears to be healing well with no redness or drainage and the staples were removed without problems.  Eyes: Conjunctivae and EOM are normal. Pupils are equal, round, and reactive to light. Right eye exhibits no discharge. Left eye exhibits no discharge. No scleral icterus.  Neck: Normal range of motion. Neck supple. No thyromegaly present.  Cardiovascular: Normal rate, regular rhythm, normal heart sounds and intact distal pulses.   No murmur heard. The heart is regular at 72/m  Pulmonary/Chest: Effort normal and breath sounds normal. No respiratory distress. She has no wheezes. She has no rales. She exhibits no tenderness.  Clear anteriorly and posteriorly  Abdominal: Soft. Bowel sounds are normal. She exhibits no mass. There is no tenderness. There is no rebound and no guarding.  No abdominal tenderness  Musculoskeletal: Normal range of motion. She exhibits tenderness. She exhibits no edema.  The patient has a walker with her today. Upon arising and sitting on the table she is definitely uncomfortable with movements with her back. There was minimal back pain with leg raising on the left side.  Lymphadenopathy:    She has no cervical adenopathy.  Neurological: She is alert and oriented to person, place, and time. She has normal reflexes. No cranial nerve deficit.  Skin: Skin is warm and dry. No rash noted.  Psychiatric: She has a normal mood and affect. Her behavior is  normal. Judgment and thought content normal.  Nursing note and vitals reviewed.  BP 118/66 (BP Location: Left Arm)   Pulse 73   Temp 97.4 F (36.3 C) (Oral)   Ht 5' 4"  (1.626 m)   Wt 144 lb (65.3 kg)   BMI 24.72 kg/m         Assessment & Plan:  1. Hypothyroidism, unspecified hypothyroidism type -Continue current treatment pending results of lab work - CBC with Differential/Platelet - Thyroid Panel With TSH  2. Essential hypertension -The blood pressure is good today and she will continue with current treatment - BMP8+EGFR - CBC with Differential/Platelet - Hepatic function panel - DME Wheelchair manual  3. Hyperlipidemia -Continue with aggressive therapeutic lifestyle changes and current treatment pending results of lab work - CBC with Differential/Platelet - Lipid panel  4. Vitamin D deficiency -Continue with current treatment pending results of lab work - CBC with Differential/Platelet - VITAMIN D 25 Hydroxy (Vit-D Deficiency, Fractures)  5. Thrombocytopenia ([facility]) -No apparent bleeding issues or complaints and we will check her platelet count for follow-up - CBC with Differential/Platelet  6. Herniated  nucleus pulposus, lumbar -The patient is having ongoing back pain but certainly better than it was prior to the surgery. She will continue to follow-up with surgery as needed - DME Wheelchair manual  7. Neuropathy ([place]) - DME Wheelchair manual  8. Neck pain - DME Wheelchair manual  9. Encounter for immunization - Flu vaccine HIGH DOSE PF  10. Scalp laceration, subsequent encounter -The staples were removed and the laceration is healing well and the patient tolerated the procedure well.  Meds ordered this encounter  Medications  . zolpidem (AMBIEN) 10 MG tablet    Sig: Take 1 tablet (10 mg total) by mouth at bedtime as needed. for sleep    Dispense:  30 tablet    Refill:  3  . traMADol (ULTRAM) 50 MG tablet    Sig: 1/2 to 1 whole tab Q12 hours PRN     Dispense:  15 tablet    Refill:  0   Patient Instructions                       Medicare Annual Wellness Visit   and the medical providers at [facility] strive to bring you the best medical care.  In doing so we not only want to address your current medical conditions and concerns but also to detect new conditions early and prevent illness, disease and health-related problems.    Medicare offers a yearly Wellness Visit which allows our clinical staff to assess your need for preventative services including immunizations, lifestyle education, counseling to decrease risk of preventable diseases and screening for fall risk and other medical concerns.    This visit is provided free of charge (no copay) for all Medicare recipients. The clinical pharmacists at [facility] have begun to conduct these Wellness Visits which will also include a thorough review of all your medications.    As you primary medical provider recommend that you make an appointment for your Annual Wellness Visit if you have not done so already this year.  You may set up this appointment before you leave today or you may call back ([phone]) and schedule an appointment.  Please make sure when you call that you mention that you are scheduling your Annual Wellness Visit with the clinical pharmacist so that the appointment may be made for the proper length of time.     Continue current medications. Continue good therapeutic lifestyle changes which include good diet and exercise. Fall precautions discussed with patient. If an FOBT was given today- please return it to our front desk. If you are over 12 years old - you may need Prevnar 23 or the adult Pneumonia vaccine.  **Flu shots are available--- please call and schedule a FLU-CLINIC appointment**  After your visit with [place] today you will receive a survey in the mail or online from [employer] regarding your care with [place].  Please take a moment to fill this out. Your feedback is very important to [place] as you can help [place] better understand your patient needs as well as improve your experience and satisfaction. WE CARE ABOUT YOU!!!   Flu shot that you received today may make your arm sore. Continue to drink plenty of fluids and stay well hydrated Follow-up with back surgeon as needed Only take the tramadol and only take one half if needed for severe pain that is not controlled by regular Tylenol. Continue to use her walker at home so that you're in not at  risk for falling.     The patient was encouraged with her son present to not take anymore Ambien than necessary and only to take half of an Ultram if needed only for severe pain and to not take it regularly.  Arrie Senate MD

## 2015-11-23 NOTE — Patient Instructions (Addendum)
Medicare Annual Wellness Visit  [facility] and the medical providers at [facility] strive to bring you the best medical care.  In doing so we not only want to address your current medical conditions and concerns but also to detect new conditions early and prevent illness, disease and health-related problems.    Medicare offers a yearly Wellness Visit which allows our clinical staff to assess your need for preventative services including immunizations, lifestyle education, counseling to decrease risk of preventable diseases and screening for fall risk and other medical concerns.    This visit is provided free of charge (no copay) for all Medicare recipients. The clinical pharmacists at [facility] have begun to conduct these Wellness Visits which will also include a thorough review of all your medications.    As you primary medical provider recommend that you make an appointment for your Annual Wellness Visit if you have not done so already this year.  You may set up this appointment before you leave today or you may call back [id number] and schedule an appointment.  Please make sure when you call that you mention that you are scheduling your Annual Wellness Visit with the clinical pharmacist so that the appointment may be made for the proper length of time.     Continue current medications. Continue good therapeutic lifestyle changes which include good diet and exercise. Fall precautions discussed with patient. If an FOBT was given today- please return it to our front desk. If you are over 61 years old - you may need Prevnar 49 or the adult Pneumonia vaccine.  **Flu shots are available--- please call and schedule a FLU-CLINIC appointment**  After your visit with [place] today you will receive a survey in the mail or online from [employer] regarding your care with [place]. Please take a moment to fill this out. Your feedback is very  important to [place] as you can help [place] better understand your patient needs as well as improve your experience and satisfaction. WE CARE ABOUT YOU!!!   Flu shot that you received today may make your arm sore. Continue to drink plenty of fluids and stay well hydrated Follow-up with back surgeon as needed Only take the tramadol and only take one half if needed for severe pain that is not controlled by regular Tylenol. Continue to use her walker at home so that you're in not at risk for falling.

## 2015-11-24 LAB — CBC WITH DIFFERENTIAL/PLATELET
BASOS: 1 %
Basophils Absolute: 0 10*3/uL (ref 0.0–0.2)
EOS (ABSOLUTE): 0.3 10*3/uL (ref 0.0–0.4)
EOS: 3 %
HEMATOCRIT: 35.6 % (ref 34.0–46.6)
HEMOGLOBIN: 11.9 g/dL (ref 11.1–15.9)
IMMATURE GRANS (ABS): 0.1 10*3/uL (ref 0.0–0.1)
IMMATURE GRANULOCYTES: 1 %
LYMPHS: 39 %
Lymphocytes Absolute: 3.4 10*3/uL — ABNORMAL HIGH (ref 0.7–3.1)
MCH: 31.8 pg (ref 26.6–33.0)
MCHC: 33.4 g/dL (ref 31.5–35.7)
MCV: 95 fL (ref 79–97)
MONOCYTES: 5 %
Monocytes Absolute: 0.5 10*3/uL (ref 0.1–0.9)
NEUTROS ABS: 4.6 10*3/uL (ref 1.4–7.0)
NEUTROS PCT: 51 %
Platelets: 162 10*3/uL (ref 150–379)
RBC: 3.74 x10E6/uL — ABNORMAL LOW (ref 3.77–5.28)
RDW: 14.1 % (ref 12.3–15.4)
WBC: 8.9 10*3/uL (ref 3.4–10.8)

## 2015-11-24 LAB — HEPATIC FUNCTION PANEL
ALK PHOS: 109 IU/L (ref 39–117)
ALT: 6 IU/L (ref 0–32)
AST: 19 IU/L (ref 0–40)
Albumin: 3.3 g/dL — ABNORMAL LOW (ref 3.5–4.7)
BILIRUBIN TOTAL: 0.5 mg/dL (ref 0.0–1.2)
BILIRUBIN, DIRECT: 0.22 mg/dL (ref 0.00–0.40)
Total Protein: 6.5 g/dL (ref 6.0–8.5)

## 2015-11-24 LAB — VITAMIN D 25 HYDROXY (VIT D DEFICIENCY, FRACTURES): VIT D 25 HYDROXY: 39 ng/mL (ref 30.0–100.0)

## 2015-11-24 LAB — BMP8+EGFR
BUN/Creatinine Ratio: 15 (ref 12–28)
BUN: 10 mg/dL (ref 8–27)
CALCIUM: 9.4 mg/dL (ref 8.7–10.3)
CO2: 29 mmol/L (ref 18–29)
CREATININE: 0.68 mg/dL (ref 0.57–1.00)
Chloride: 101 mmol/L (ref 96–106)
GFR, EST AFRICAN AMERICAN: 90 mL/min/{1.73_m2} (ref >59)
GFR, EST NON AFRICAN AMERICAN: 78 mL/min/{1.73_m2} (ref >59)
Glucose: 113 mg/dL — ABNORMAL HIGH (ref 65–99)
POTASSIUM: 3.3 mmol/L — AB (ref 3.5–5.2)
Sodium: 145 mmol/L — ABNORMAL HIGH (ref 134–144)

## 2015-11-24 LAB — LIPID PANEL
CHOLESTEROL TOTAL: 131 mg/dL (ref 100–199)
Chol/HDL Ratio: 2.4 ratio units (ref 0.0–4.4)
HDL: 54 mg/dL (ref >39)
LDL Calculated: 50 mg/dL (ref 0–99)
Triglycerides: 133 mg/dL (ref 0–149)
VLDL CHOLESTEROL CAL: 27 mg/dL (ref 5–40)

## 2015-11-24 LAB — THYROID PANEL WITH TSH
FREE THYROXINE INDEX: 1.9 (ref 1.2–4.9)
T3 Uptake Ratio: 28 % (ref 24–39)
T4 TOTAL: 6.9 ug/dL (ref 4.5–12.0)
TSH: 1.29 u[IU]/mL (ref 0.450–4.500)

## 2015-11-24 NOTE — Addendum Note (Signed)
Addended by: Thana Ates on: 11/24/2015 01:56 PM   Modules accepted: Orders

## 2015-12-01 DIAGNOSIS — M7061 Trochanteric bursitis, right hip: Secondary | ICD-10-CM | POA: Diagnosis not present

## 2015-12-01 DIAGNOSIS — M4316 Spondylolisthesis, lumbar region: Secondary | ICD-10-CM | POA: Diagnosis not present

## 2015-12-02 ENCOUNTER — Encounter (HOSPITAL_BASED_OUTPATIENT_CLINIC_OR_DEPARTMENT_OTHER): Payer: Self-pay | Admitting: Emergency Medicine

## 2015-12-02 ENCOUNTER — Telehealth: Payer: Self-pay | Admitting: Family Medicine

## 2015-12-02 ENCOUNTER — Emergency Department (HOSPITAL_BASED_OUTPATIENT_CLINIC_OR_DEPARTMENT_OTHER)
Admission: EM | Admit: 2015-12-02 | Discharge: 2015-12-03 | Disposition: A | Discharge status: Home / Self Care | Payer: Medicare Other | Attending: Emergency Medicine | Admitting: Emergency Medicine

## 2015-12-02 DIAGNOSIS — R443 Hallucinations, unspecified: Secondary | ICD-10-CM | POA: Diagnosis not present

## 2015-12-02 DIAGNOSIS — Z7722 Contact with and (suspected) exposure to environmental tobacco smoke (acute) (chronic): Secondary | ICD-10-CM | POA: Insufficient documentation

## 2015-12-02 DIAGNOSIS — S0990XA Unspecified injury of head, initial encounter: Secondary | ICD-10-CM | POA: Diagnosis not present

## 2015-12-02 DIAGNOSIS — T380X5A Adverse effect of glucocorticoids and synthetic analogues, initial encounter: Secondary | ICD-10-CM | POA: Diagnosis not present

## 2015-12-02 DIAGNOSIS — T50905A Adverse effect of unspecified drugs, medicaments and biological substances, initial encounter: Secondary | ICD-10-CM

## 2015-12-02 DIAGNOSIS — E039 Hypothyroidism, unspecified: Secondary | ICD-10-CM | POA: Insufficient documentation

## 2015-12-02 DIAGNOSIS — T50995A Adverse effect of other drugs, medicaments and biological substances, initial encounter: Secondary | ICD-10-CM | POA: Diagnosis not present

## 2015-12-02 DIAGNOSIS — R4182 Altered mental status, unspecified: Secondary | ICD-10-CM | POA: Diagnosis present

## 2015-12-02 LAB — URINALYSIS, ROUTINE W REFLEX MICROSCOPIC
Bilirubin Urine: NEGATIVE
GLUCOSE, UA: NEGATIVE mg/dL
Ketones, ur: NEGATIVE mg/dL
LEUKOCYTES UA: NEGATIVE
Nitrite: NEGATIVE
Protein, ur: NEGATIVE mg/dL
SPECIFIC GRAVITY, URINE: 1.012 (ref 1.005–1.030)
pH: 5.5 (ref 5.0–8.0)

## 2015-12-02 LAB — URINE MICROSCOPIC-ADD ON: WBC, UA: NONE SEEN WBC/hpf (ref 0–5)

## 2015-12-02 NOTE — Telephone Encounter (Signed)
Mrs Subia called to cancel appt Per Dr Laurance Flatten, he wants pt to d/c Ambien and cut dosage of Hydrocodone. Pt instructed, verbalizes understanding.

## 2015-12-02 NOTE — ED Notes (Signed)
Pt unable to provide urine sample at this time 

## 2015-12-02 NOTE — ED Triage Notes (Addendum)
Family reports intermittent confusion since this am. Pt had a fall with head laceration 11/17/15. Denies any fever at home. Family concerned pt may have UTI.

## 2015-12-02 NOTE — Telephone Encounter (Signed)
Mother, [name], had a delusional episode this morning, with her seeing people in her home at 4:30am this morning and thought they were stealing her medications and taking her things.  Called her son and told them to call the police.  The police came and soon realized that this was not a real incident and that she had hallucinated it.  Son is concerned because she had fell two weeks ago and hit her head and refused to get a CT scan.  Wants to make sure she is not suffering from a UTI as well and also had a steroid shot yesterday by Dr. Louanne Skye.  Wants the doctor who sees her to be aware of all of these things to make an informed decision as her children are very worried about her.

## 2015-12-03 ENCOUNTER — Emergency Department (HOSPITAL_BASED_OUTPATIENT_CLINIC_OR_DEPARTMENT_OTHER): Payer: Medicare Other

## 2015-12-03 ENCOUNTER — Encounter (HOSPITAL_BASED_OUTPATIENT_CLINIC_OR_DEPARTMENT_OTHER): Payer: Self-pay | Admitting: Emergency Medicine

## 2015-12-03 DIAGNOSIS — S0990XA Unspecified injury of head, initial encounter: Secondary | ICD-10-CM | POA: Diagnosis not present

## 2015-12-03 DIAGNOSIS — R443 Hallucinations, unspecified: Secondary | ICD-10-CM | POA: Diagnosis not present

## 2015-12-03 LAB — COMPREHENSIVE METABOLIC PANEL
ALT: 12 U/L — ABNORMAL LOW (ref 14–54)
ANION GAP: 7 (ref 5–15)
AST: 27 U/L (ref 15–41)
Albumin: 3.2 g/dL — ABNORMAL LOW (ref 3.5–5.0)
Alkaline Phosphatase: 93 U/L (ref 38–126)
BILIRUBIN TOTAL: 0.6 mg/dL (ref 0.3–1.2)
BUN: 16 mg/dL (ref 6–20)
CO2: 32 mmol/L (ref 22–32)
Calcium: 8.9 mg/dL (ref 8.9–10.3)
Chloride: 103 mmol/L (ref 101–111)
Creatinine, Ser: 0.84 mg/dL (ref 0.44–1.00)
Glucose, Bld: 127 mg/dL — ABNORMAL HIGH (ref 65–99)
POTASSIUM: 3.2 mmol/L — AB (ref 3.5–5.1)
Sodium: 142 mmol/L (ref 135–145)
TOTAL PROTEIN: 6.7 g/dL (ref 6.5–8.1)

## 2015-12-03 LAB — CBC WITH DIFFERENTIAL/PLATELET
BASOS PCT: 1 %
Basophils Absolute: 0.1 10*3/uL (ref 0.0–0.1)
EOS ABS: 0 10*3/uL (ref 0.0–0.7)
Eosinophils Relative: 0 %
HEMATOCRIT: 35.6 % — AB (ref 36.0–46.0)
Hemoglobin: 11 g/dL — ABNORMAL LOW (ref 12.0–15.0)
Lymphocytes Relative: 19 %
Lymphs Abs: 2.7 10*3/uL (ref 0.7–4.0)
MCH: 31.9 pg (ref 26.0–34.0)
MCHC: 30.9 g/dL (ref 30.0–36.0)
MCV: 103.2 fL — AB (ref 78.0–100.0)
MONO ABS: 0.7 10*3/uL (ref 0.1–1.0)
MONOS PCT: 5 %
NEUTROS PCT: 76 %
Neutro Abs: 10.7 10*3/uL — ABNORMAL HIGH (ref 1.7–7.7)
Platelets: 135 10*3/uL — ABNORMAL LOW (ref 150–400)
RBC: 3.45 MIL/uL — ABNORMAL LOW (ref 3.87–5.11)
RDW: 13.7 % (ref 11.5–15.5)
WBC: 14.2 10*3/uL — ABNORMAL HIGH (ref 4.0–10.5)

## 2015-12-03 NOTE — ED Notes (Signed)
MD at bedside. 

## 2015-12-03 NOTE — ED Provider Notes (Addendum)
[facility] DEPT [facility] Provider Note   CSN: [id number] Arrival date & time: 12/02/15  2220     History   Chief Complaint Chief Complaint  Patient presents with  . Altered Mental Status    HPI KASIE LAUREL is a 82 y.o. female.  The history is provided by the patient.  Altered Mental Status   This is a new problem. The current episode started 12 to 24 hours ago. The problem has been resolved. Associated symptoms include delusions and hallucinations. Pertinent negatives include no confusion, no somnolence, no seizures, no unresponsiveness, no weakness, no agitation, no self-injury and no violence. Risk factors include a change in prescription (steroid injection yesterday, on tramadol, vicodin and also did not sleep at all last night). Her past medical history does not include diabetes.    Past Medical History:  Diagnosis Date  . Adhesion of abdominal wall    "continues to have abdominal pain pain intermittent right abdominal side. "scar tissue"  . Arthritis   . Breast cyst   . Bronchitis, asthmatic    'bronchitis that will go into an asthma like attack but haven't had it in years'  . Colon polyps   . Elevated blood sugar   . Esophageal stricture    "occ. problems with swallowing water"  . Full dentures   . GI bleed   . Goiter   . History of blood transfusion   . History of hiatal hernia    repaired over 20 years ago  . ITP (idiopathic thrombocytopenic purpura)    not a problem now.  . Other and unspecified hyperlipidemia   . Peptic ulcer disease   . Rhinitis   . Unspecified essential hypertension   . Unspecified hypothyroidism     Patient Active Problem List   Diagnosis Date Noted  . HNP (herniated nucleus pulposus), cervical 05/16/2015    Class: Chronic  . Herniated nucleus pulposus, lumbar 05/16/2015  . Abnormal EKG 04/21/2015  . Thrombocytopenia ([place]) 08/18/2014  . Insomnia 11/24/2013  . Impingement syndrome of left shoulder 09/18/2013  . S/P  arthroscopy of shoulder 09/18/2013  . Low back pain 03/17/2013  . Neuropathy ([place]) 03/17/2013  . Metabolic syndrome [id number]  . Overweight (BMI 25.0-29.9) 03/17/2013  . Vitamin D deficiency 12/01/2012  . Chronic insomnia 12/01/2012  . Fibrocystic breast changes 05/21/2011  . Essential hypertension   . Hyperlipidemia   . Peptic ulcer disease   . ITP (idiopathic thrombocytopenic purpura)   . Rhinitis   . Elevated blood sugar   . Hypothyroidism   . Goiter   . Colon polyps   . Adhesion of abdominal wall   . Esophageal stricture     Past Surgical History:  Procedure Laterality Date  . ABDOMINAL HYSTERECTOMY    . APPENDECTOMY    . BACK SURGERY     x1 Lumbar, x2 cervical anterior and posterior .  Marland Kitchen BREAST SURGERY     x3 biopsy  . cataract surgery Bilateral   . CHOLECYSTECTOMY    . EYE SURGERY Bilateral 2007   cataracts   . HEMORRHOID SURGERY    . HERNIA REPAIR    . JOINT REPLACEMENT    . LUMBAR LAMINECTOMY N/A 05/16/2015   Procedure: MICRODISCECTOMY Lumbar 3-Lumbar 4;  Surgeon: Jessy Oto, MD;  Location: [facility];  Service: Orthopedics;  Laterality: N/A;  . 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 THYROID SURGERY     Total "goiter removal"  . TOTAL KNEE ARTHROPLASTY Right  2010    OB History    No data available       Home Medications    Prior to Admission medications   Medication Sig Start Date End Date Taking? Authorizing Provider  atorvastatin (LIPITOR) 80 MG tablet TAKE 1 TABLET ONCE A DAY 08/26/15   Chipper Herb, MD  calcium citrate-vitamin D (CITRACAL+D) 315-200 MG-UNIT per tablet Take 1 tablet by mouth daily.    Historical Provider, MD  Cholecalciferol (VITAMIN D3) 2000 UNITS TABS Take 1 tablet by mouth daily.      Historical Provider, MD  HYDROcodone-acetaminophen (NORCO) 5-325 MG tablet Take 1-2 tablets by mouth every 6 (six) hours as needed for moderate pain. 08/12/15   Chipper Herb, MD  levothyroxine (LEVOTHROID) 25 MCG tablet Take 1/2  tablet (12.16mcg) daily.  Along with 141mcg of Synthroid 12/28/14   Chipper Herb, MD  levothyroxine (SYNTHROID, LEVOTHROID) 150 MCG tablet TAKE 1 TABLET DAILY 11/15/15   Chipper Herb, MD  methocarbamol (ROBAXIN) 500 MG tablet Take 1 tablet (500 mg total) by mouth every 8 (eight) hours as needed for muscle spasms. 05/17/15   Jessy Oto, MD  Multiple Vitamin (MULTIVITAMIN WITH MINERALS) TABS tablet Take 1 tablet by mouth daily.    Historical Provider, MD  omeprazole (PRILOSEC) 20 MG capsule Take 20 mg by mouth daily.    Historical Provider, MD  polyvinyl alcohol (LIQUIFILM TEARS) 1.4 % ophthalmic solution Place 1 drop into both eyes as needed for dry eyes.    Historical Provider, MD  traMADol Veatrice Bourbon) 50 MG tablet 1/2 to 1 whole tab Q12 hours PRN 11/23/15   Chipper Herb, MD  valsartan-hydrochlorothiazide (DIOVAN-HCT) 320-25 MG tablet TAKE 1 TABLET DAILY 09/01/15   Chipper Herb, MD  zolpidem (AMBIEN) 10 MG tablet Take 1 tablet (10 mg total) by mouth at bedtime as needed. for sleep 11/23/15   Chipper Herb, MD    Family History Family History  Problem Relation Age of Onset  . Lung cancer Father   . Heart disease Mother     Unkown.  Died age 34  . Emphysema Sister     Social History Social History  Substance Use Topics  . Smoking status: Passive Smoke Exposure - Never Smoker    Start date: 03/12/1945    Last attempt to quit: 03/12/1982  . Smokeless tobacco: Never Used  . Alcohol use No     Allergies   Prednisone   Review of Systems Review of Systems  Constitutional: Negative for fever.  HENT: Negative for facial swelling.   Respiratory: Negative for shortness of breath.   Cardiovascular: Negative for chest pain, palpitations and leg swelling.  Gastrointestinal: Negative for abdominal pain, nausea and vomiting.  Genitourinary: Negative for dysuria.  Neurological: Negative for dizziness, seizures, facial asymmetry, speech difficulty, weakness, light-headedness, numbness and  headaches.  Psychiatric/Behavioral: Positive for hallucinations. Negative for agitation, confusion and self-injury. The patient is not hyperactive.   All other systems reviewed and are negative.    Physical Exam Updated Vital Signs BP 148/62 (BP Location: Left Arm)   Pulse 65   Temp 98.6 F (37 C) (Oral)   Resp 20   Ht 5\' 4"  (1.626 m)   Wt 144 lb (65.3 kg)   SpO2 96%   BMI 24.72 kg/m   Physical Exam  Constitutional: She is oriented to person, place, and time. She appears well-developed and well-nourished. No distress.  HENT:  Head: Normocephalic and atraumatic. Head is without raccoon's eyes and  without Battle's sign.  Right Ear: No hemotympanum.  Left Ear: No hemotympanum.  Nose: Nose normal.  Mouth/Throat: No oropharyngeal exudate.  Eyes: EOM are normal. Pupils are equal, round, and reactive to light.  Neck: Normal range of motion. Neck supple. No JVD present.  Cardiovascular: Normal rate, regular rhythm, normal heart sounds and intact distal pulses.   Pulmonary/Chest: Effort normal and breath sounds normal. No stridor. No respiratory distress. She has no wheezes. She has no rales.  Abdominal: Soft. Bowel sounds are normal. She exhibits no mass. There is no tenderness. There is no rebound and no guarding.  Musculoskeletal: Normal range of motion.  Lymphadenopathy:    She has no cervical adenopathy.  Neurological: She is alert and oriented to person, place, and time. She has normal reflexes.  Skin: Skin is warm and dry. Capillary refill takes less than 2 seconds.  Psychiatric: She has a normal mood and affect.     ED Treatments / Results   Vitals:   12/02/15 2238 12/03/15 0043  BP: 140/66 148/62  Pulse: 77 65  Resp: 18 20  Temp: 98.6 F (37 C)     No results found.  Procedures Procedures (including critical care time)  Medications Ordered in ED Medications - No data to display   Initial Impression / Assessment and Plan / ED Course  I have reviewed the  triage vital signs and the nursing notes.  Pertinent labs & imaging results that were available during my care of the patient were reviewed by me and considered in my medical decision making (see chart for details).  Clinical Course   Final Clinical Impressions(s) / ED Diagnoses    Symptoms have resolved. I suspect this started with the steroid injection that the patient received yesterday.  Patient did not sleep at all last night likely secondary to steroids but the lack of sleep made the side effects of the steroids worse.  The patient then added ultram to this which can also cause hallucinations.  I believe her symptoms this am are secondary to the synergistic adverse effect meds and lack of sleep.   I recommend stopping narcotics, robaxin, and ambien.  Drink copious liquids to flush out the effects of the steroids.     Urine had 0 white cells in it and was contaminated.  Per family request it was sent for culture though I stated I believe this will be low yield. Family is concerned about the white count of 14.  EDP explained that the patient received high dose steroids yesterday and this will elevated your white count transiently.  Explained CT was normal for the patient's age. Patient is well appearing, normal exam. She is AO3 in the department.  I suspect the patient is back to normal because some of the medication is clearing her system due to a measure of time.  Family was instructed to not give ultram, ambien, robaxin, and vicodin.  The medications can lead to sedation, hallucinations and other untoward side effects.  Moreover, on these medications she is a greater risk to fall and she recently has had a fall.  I have instructed family to give extra strength tylenol for aches and pains and to contact all patient's physicians and let them know this has happened on these medications.  I suspect the physicians were not aware of all these medications as they are not all entering RX through [facility].  It  is important, particularly in the elderly, to be cautious with medication and to have RX  coming from one physician to prevent polypharmacy.    Low platelets may be secondary to PPI use.  Have rechecked in one month, printed on AVS New Prescriptions New Prescriptions   No medications on file  All questions answered to patient's satisfaction. Based on history and exam patient has been appropriately medically screened and emergency conditions excluded. Patient is stable for discharge at this time. Follow up with your PMD for recheck in 2 days and strict return precautions given    Lynnet Hefley, MD 12/03/15 0238    Keelan Pomerleau, MD 12/03/15 [id number]

## 2015-12-03 NOTE — Discharge Instructions (Signed)
Follow up for recheck of your platelets in one month they were slightly low at 135K in the ED

## 2015-12-04 LAB — URINE CULTURE: CULTURE: NO GROWTH

## 2015-12-09 ENCOUNTER — Other Ambulatory Visit: Payer: Medicare Other

## 2015-12-09 DIAGNOSIS — E875 Hyperkalemia: Secondary | ICD-10-CM

## 2015-12-09 LAB — BMP8+EGFR
BUN/Creatinine Ratio: 12 (ref 12–28)
BUN: 8 mg/dL (ref 8–27)
CALCIUM: 9.3 mg/dL (ref 8.7–10.3)
CO2: 30 mmol/L — AB (ref 18–29)
CREATININE: 0.66 mg/dL (ref 0.57–1.00)
Chloride: 99 mmol/L (ref 96–106)
GFR calc Af Amer: 91 mL/min/{1.73_m2} (ref >59)
GFR calc non Af Amer: 79 mL/min/{1.73_m2} (ref >59)
GLUCOSE: 111 mg/dL — AB (ref 65–99)
POTASSIUM: 3 mmol/L — AB (ref 3.5–5.2)
SODIUM: 144 mmol/L (ref 134–144)

## 2015-12-14 MED ORDER — POTASSIUM CHLORIDE ER 10 MEQ PO TBCR: 10.0000 meq | EXTENDED_RELEASE_TABLET | Freq: Every day | ORAL | 0 refills | Status: DC

## 2016-01-06 ENCOUNTER — Other Ambulatory Visit (INDEPENDENT_AMBULATORY_CARE_PROVIDER_SITE_OTHER): Payer: Medicare Other

## 2016-01-06 DIAGNOSIS — E039 Hypothyroidism, unspecified: Secondary | ICD-10-CM

## 2016-01-07 LAB — THYROID PANEL WITH TSH
Free Thyroxine Index: 4.4 (ref 1.2–4.9)
T3 Uptake Ratio: 39 % (ref 24–39)
T4, Total: 11.4 ug/dL (ref 4.5–12.0)
TSH: 0.098 u[IU]/mL — ABNORMAL LOW (ref 0.450–4.500)

## 2016-01-11 ENCOUNTER — Encounter (INDEPENDENT_AMBULATORY_CARE_PROVIDER_SITE_OTHER): Payer: Self-pay | Admitting: Specialist

## 2016-01-16 ENCOUNTER — Other Ambulatory Visit: Payer: Self-pay | Admitting: Family Medicine

## 2016-01-17 ENCOUNTER — Ambulatory Visit (INDEPENDENT_AMBULATORY_CARE_PROVIDER_SITE_OTHER): Payer: Medicare Other | Admitting: Specialist

## 2016-01-17 ENCOUNTER — Encounter (INDEPENDENT_AMBULATORY_CARE_PROVIDER_SITE_OTHER): Payer: Self-pay | Admitting: Specialist

## 2016-01-17 VITALS — BP 133/67 | HR 79 | Ht 64.0 in | Wt 144.0 lb

## 2016-01-17 DIAGNOSIS — M5441 Lumbago with sciatica, right side: Secondary | ICD-10-CM | POA: Diagnosis not present

## 2016-01-17 DIAGNOSIS — M4316 Spondylolisthesis, lumbar region: Secondary | ICD-10-CM | POA: Diagnosis not present

## 2016-01-17 DIAGNOSIS — M4856XD Collapsed vertebra, not elsewhere classified, lumbar region, subsequent encounter for fracture with routine healing: Secondary | ICD-10-CM

## 2016-01-17 MED ORDER — HYDROCODONE-ACETAMINOPHEN 5-325 MG PO TABS: 1.0000 | ORAL_TABLET | Freq: Four times a day (QID) | ORAL | 0 refills | Status: DC | PRN

## 2016-01-17 NOTE — Progress Notes (Signed)
Office Visit Note   Patient: [name]           Date of Birth: June 15, 1942           MRN: [id number] Visit Date: 01/17/2016              Requested by: Chipper Herb, MD [address] [place], [place] [address] PCP: Redge Gainer, MD   Assessment & Plan: Visit Diagnoses:  1. Acute left-sided low back pain with right-sided sciatica   2. Non-traumatic compression fracture of L3 lumbar vertebra with routine healing, subsequent encounter   3. Spondylolisthesis, lumbar region   This patient complains of back pain and some discomfort into her left lateral thigh and lateral leg. Posterior pain is above the knee consistent with an L3 or L4 radiculopathy. She has been seen in the last month x-rays were taken in September the studies reviewed to demonstrate compression deformities of both the L3 and L4 levels with the worsening of the spondylolisthesis of L3 on L4. She had a fall immediately prior to the visit in September that the x-rays were taken. Previous bone density indicated minimal T score however the bone density was done just over lumbar spine and not the hips or heel or forearm. Severe spondylosis changes and arthrosis of the lumbar spine so that it is reasonable to think that her T score may be falsely increased. It is likely that she has some degree of osteopenia or osteoporosis considering two-level compression fracture with her more recent fall. Today though we talked about recent death of her spouse and I've indicated to area and then I think we should wait and give a chance to heal the compression deformities to her an additional 3 months following the injury in about 6 weeks be able to discern whether or not she will see improvement in pain relative to healing of her compression fractures. We will perform follow-up radiographs at her next visit in order to determine if there is been any progression of the compression deformities. May need to consider referral for an of osteoporosis  or repeat studies checking bone density at either the heel or the hips to confirm whether or not she has significant osteopenia or osteoporosis. I will continue her on some form of medication for pain relief hydrocodone as she does have a compression deformity or pack and ongoing complaints that may refer to worsening spondylolisthesis and spinal stenosis after undergoing disc excision at the L3-4 level. She is to avoid frequent bending stooping and lifting weights. 10 pounds command for stationary bicycle and walking to tolerance.  Plan: Avoid frequent bending and stooping  No lifting greater than 10 lbs. May use ice or moist heat for pain. Weight loss is of benefit. Handicap license is approved.     Follow-Up Instructions: Return in about 4 weeks (around 02/14/2016) for assess healing compression fracture and slip at L3-4.   Orders:  Orders Placed This Encounter  Procedures  . XR Lumbar Spine 2-3 Views   Meds ordered this encounter  Medications  . HYDROcodone-acetaminophen (NORCO/VICODIN) 5-325 MG tablet    Sig: Take 1 tablet by mouth every 6 (six) hours as needed for moderate pain.    Dispense:  30 tablet    Refill:  0      Procedures: No procedures performed   Clinical Data: Findings:  Xrays from 11/2015 show worsened spondylolisthesis L3-4 with concavity of the superior endplate L4 and the anterior inferior margin of the L3 vertebral body  consistent with compression fractures. Bone density testing 10/2015 was normal but only check an arthritic spine which may give a false impression of stronger densitty.    Subjective: Chief Complaint  Patient presents with  . Lower Back - Follow-up  . Left Hip - Pain    Patient is here for 80 week return office visit. She was last seen by Benjiman Core PA-C for left greater trochanteric bursitis on 11/30/15. She was given a steroid injection, which she states she did not experience much relief from. She also is status post L3-4  microdiscectomy from 05/16/15. She does have some pain in her lower back today and lateral left hip pain. She states the pain is persistent throughout the day and worse in the evenings. The pain also becomes worse with standing or sitting. She was taking hydrocodone for pain but is currently out of this medication,Pain with standing and walking. Bladder difficulty especially in the AM. No pain medication right now. No numbness or tingling. Pain mainly left thigh and lateral pelvis.     Review of Systems  Constitutional: Positive for unexpected weight change.  HENT: Negative.   Eyes: Positive for visual disturbance.  Respiratory: Negative.   Cardiovascular: Positive for leg swelling. Negative for chest pain and palpitations.  Gastrointestinal: Negative.   Endocrine: Positive for cold intolerance and polyuria. Negative for heat intolerance, polydipsia and polyphagia.  Genitourinary: Positive for enuresis. Negative for difficulty urinating, dyspareunia and dysuria.  Musculoskeletal: Positive for arthralgias, back pain and gait problem.  Skin: Negative.   Allergic/Immunologic: Negative.   Neurological: Positive for weakness. Negative for dizziness, tremors, seizures, syncope, facial asymmetry, speech difficulty, light-headedness, numbness and headaches.  Hematological: Negative.   Psychiatric/Behavioral: Positive for dysphoric mood. Negative for agitation, behavioral problems, confusion, decreased concentration, hallucinations, self-injury, sleep disturbance and suicidal ideas. The patient is not nervous/anxious and is not hyperactive.      Objective: Vital Signs: BP 133/67 (BP Location: Left Arm, Patient Position: Sitting)   Pulse 79   Ht 5\' 4"  (1.626 m)   Wt 144 lb (65.3 kg)   BMI 24.72 kg/m   Physical Exam  Constitutional: She is oriented to person, place, and time. She appears well-developed and well-nourished.  Eyes: EOM are normal. Pupils are equal, round, and reactive to light.    Neck: Normal range of motion. Neck supple.  Pulmonary/Chest: Effort normal and breath sounds normal.  Abdominal: Soft. Bowel sounds are normal.  Neurological: She is alert and oriented to person, place, and time.  Skin: Skin is warm and dry.  Psychiatric: She has a normal mood and affect. Her behavior is normal. Judgment and thought content normal.    Back Exam   Tenderness  The patient is experiencing tenderness in the lumbar.  Range of Motion  Extension: abnormal  Flexion: abnormal  Lateral Bend Right: abnormal  Lateral Bend Left: abnormal  Rotation Right: abnormal  Rotation Left: abnormal   Muscle Strength  Right Quadriceps:  5/5  Left Quadriceps:  5/5  Right Hamstrings:  5/5  Left Hamstrings:  5/5   Other  Toe Walk: normal Heel Walk: normal Sensation: normal Gait: abnormal  Erythema: no back redness Scars: absent      Specialty Comments:  No specialty comments available.  Imaging: No results found.   PMFS History: Patient Active Problem List   Diagnosis Date Noted  . HNP (herniated nucleus pulposus), cervical 05/16/2015    Priority: High    Class: Chronic  . Herniated nucleus pulposus, lumbar 05/16/2015  .  Abnormal EKG 04/21/2015  . Thrombocytopenia ([facility]) 08/18/2014  . Insomnia 11/24/2013  . Impingement syndrome of left shoulder 09/18/2013  . S/P arthroscopy of shoulder 09/18/2013  . Low back pain 03/17/2013  . Neuropathy ([facility]) 03/17/2013  . Metabolic syndrome [id number]  . Overweight (BMI 25.0-29.9) 03/17/2013  . Vitamin D deficiency 12/01/2012  . Chronic insomnia 12/01/2012  . Fibrocystic breast changes 05/21/2011  . Essential hypertension   . Hyperlipidemia   . Peptic ulcer disease   . ITP (idiopathic thrombocytopenic purpura)   . Rhinitis   . Elevated blood sugar   . Hypothyroidism   . Goiter   . Colon polyps   . Adhesion of abdominal wall   . Esophageal stricture    Past Medical History:  Diagnosis Date  . Adhesion of abdominal  wall    "continues to have abdominal pain pain intermittent right abdominal side. "scar tissue"  . Arthritis   . Breast cyst   . Bronchitis, asthmatic    'bronchitis that will go into an asthma like attack but haven't had it in years'  . Colon polyps   . Elevated blood sugar   . Esophageal stricture    "occ. problems with swallowing water"  . Full dentures   . GI bleed   . Goiter   . History of blood transfusion   . History of hiatal hernia    repaired over 20 years ago  . ITP (idiopathic thrombocytopenic purpura)    not a problem now.  . Other and unspecified hyperlipidemia   . Peptic ulcer disease   . Rhinitis   . Unspecified essential hypertension   . Unspecified hypothyroidism     Family History  Problem Relation Age of Onset  . Lung cancer Father   . Heart disease Mother     Unkown.  Died age 28  . Emphysema Sister     Past Surgical History:  Procedure Laterality Date  . ABDOMINAL HYSTERECTOMY    . APPENDECTOMY    . BACK SURGERY     x1 Lumbar, x2 cervical anterior and posterior .  Marland Kitchen BREAST SURGERY     x3 biopsy  . cataract surgery Bilateral   . CHOLECYSTECTOMY    . EYE SURGERY Bilateral 2007   cataracts   . HEMORRHOID SURGERY    . HERNIA REPAIR    . JOINT REPLACEMENT    . LUMBAR LAMINECTOMY N/A 05/16/2015   Procedure: MICRODISCECTOMY Lumbar 3-Lumbar 4;  Surgeon: Jessy Oto, MD;  Location: [facility];  Service: Orthopedics;  Laterality: N/A;  . 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 THYROID SURGERY     Total "goiter removal"  . TOTAL KNEE ARTHROPLASTY Right 2010   Social History   Occupational History  . Not on file.   Social History Main Topics  . Smoking status: Passive Smoke Exposure - Never Smoker    Start date: 03/12/1945    Last attempt to quit: 03/12/1982  . Smokeless tobacco: Never Used  . Alcohol use No  . Drug use: No  . Sexual activity: Not Currently

## 2016-01-17 NOTE — Patient Instructions (Signed)
Avoid frequent bending and stooping  No lifting greater than 10 lbs. May use ice or moist heat for pain. Weight loss is of benefit. Handicap license is approved.   

## 2016-02-24 ENCOUNTER — Encounter (INDEPENDENT_AMBULATORY_CARE_PROVIDER_SITE_OTHER): Payer: Self-pay

## 2016-02-24 ENCOUNTER — Ambulatory Visit (INDEPENDENT_AMBULATORY_CARE_PROVIDER_SITE_OTHER): Payer: Medicare Other | Admitting: Specialist

## 2016-02-24 ENCOUNTER — Ambulatory Visit (INDEPENDENT_AMBULATORY_CARE_PROVIDER_SITE_OTHER): Payer: Medicare Other

## 2016-02-24 ENCOUNTER — Encounter (INDEPENDENT_AMBULATORY_CARE_PROVIDER_SITE_OTHER): Payer: Self-pay | Admitting: Specialist

## 2016-02-24 VITALS — Ht 64.0 in | Wt 144.0 lb

## 2016-02-24 DIAGNOSIS — M4726 Other spondylosis with radiculopathy, lumbar region: Secondary | ICD-10-CM | POA: Diagnosis not present

## 2016-02-24 DIAGNOSIS — M5442 Lumbago with sciatica, left side: Secondary | ICD-10-CM | POA: Diagnosis not present

## 2016-02-24 DIAGNOSIS — M5441 Lumbago with sciatica, right side: Secondary | ICD-10-CM | POA: Diagnosis not present

## 2016-02-24 DIAGNOSIS — G8929 Other chronic pain: Secondary | ICD-10-CM

## 2016-02-24 DIAGNOSIS — M48062 Spinal stenosis, lumbar region with neurogenic claudication: Secondary | ICD-10-CM

## 2016-02-24 MED ORDER — HYDROCODONE-ACETAMINOPHEN 5-325 MG PO TABS: 1.0000 | ORAL_TABLET | Freq: Four times a day (QID) | ORAL | 0 refills | Status: DC | PRN

## 2016-02-24 NOTE — Progress Notes (Signed)
Office Visit Note   Patient: [name]           Date of Birth: [birth year]           MRN: [id number] Visit Date: 02/24/2016              Requested by: Chipper Herb, MD [address] [place], [place] [address] PCP: Redge Gainer, MD   Assessment & Plan: Visit Diagnoses:  1. Chronic bilateral low back pain with bilateral sciatica   2. Other spondylosis with radiculopathy, lumbar region   3. Spinal stenosis of lumbar region with neurogenic claudication     PlanAvoid bending, stooping and avoid lifting weights greater than 10 lbs. Avoid prolong standing and walking. Avoid frequent bending and stooping  No lifting greater than 10 lbs. May use ice or moist heat for pain. Weight loss is of benefit. Handicap license is approved. Dr. Romona Curls secretary/Assistant will call to arrange for epidural steroid injection   Follow-Up Instructions: Return in about 4 weeks (around 03/23/2016).   Orders:  Orders Placed This Encounter  Procedures  . XR Lumbar Spine 2-3 Views  . Ambulatory referral to Physical Medicine Rehab   Meds ordered this encounter  Medications  . HYDROcodone-acetaminophen (NORCO/VICODIN) 5-325 MG tablet    Sig: Take 1-2 tablets by mouth every 6 (six) hours as needed for moderate pain.    Dispense:  60 tablet    Refill:  0      Procedures: No procedures performed   Clinical Data: No additional findings.   Subjective: Chief Complaint  Patient presents with  . Lower Back - Pain, Follow-up    Patient is returning for 4 week follow up to assess healing of compression fracture at L3. States her back is about the same, don't feel like its getting much better. Pain is in the left upper hip into the left lateral thigh, into left lateral calf and left dorsal foot. No bowel or bladder difficulties. Pain worsens with standing and walking. Improves with sitting and bending and stooping. Previous MRI fro 09/2015 with compression fractures inferior endplate L3 and  superior endplate of L4, mostly righ t lateral recess stenosis. She is status post right L3-4 microdiscectomy for right L3-4 HNP and spondylolisthesis. Has had some worsening of the listhesis at L3-4.    Review of Systems  Constitutional: Positive for unexpected weight change.  HENT: Negative.   Eyes: Positive for visual disturbance.  Respiratory: Positive for wheezing.   Cardiovascular: Negative.   Gastrointestinal: Negative.   Endocrine: Negative.   Genitourinary: Negative.   Musculoskeletal: Positive for back pain and gait problem.  Allergic/Immunologic: Positive for environmental allergies. Negative for food allergies.  Neurological: Positive for weakness and numbness.  Hematological: Negative.   Psychiatric/Behavioral: Negative.      Objective: Vital Signs: Ht 5\' 4"  (1.626 m)   Wt 144 lb (65.3 kg)   BMI 24.72 kg/m   Physical Exam  Constitutional: She is oriented to person, place, and time. She appears well-developed and well-nourished.  HENT:  Head: Normocephalic and atraumatic.  Eyes: EOM are normal. Pupils are equal, round, and reactive to light.  Neck: Normal range of motion. Neck supple.  Pulmonary/Chest: Effort normal and breath sounds normal.  Abdominal: Soft. Bowel sounds are normal.  Neurological: She is alert and oriented to person, place, and time.  Skin: Skin is warm and dry.  Psychiatric: She has a normal mood and affect. Her behavior is normal. Judgment and thought content normal.  Back Exam   Tenderness  The patient is experiencing tenderness in the lumbar.  Range of Motion  Extension: abnormal  Flexion: normal  Lateral Bend Right: abnormal  Lateral Bend Left: abnormal   Muscle Strength  Right Quadriceps:  5/5  Left Quadriceps:  5/5  Right Hamstrings:  5/5  Left Hamstrings:  5/5   Tests  Straight leg raise right: negative Straight leg raise left: negative  Other  Toe Walk: normal Heel Walk: normal Sensation: normal Gait: normal    Erythema: no back redness Scars: present  Comments:  Left EHL weakness, and in left foot dorsiflexion.      Specialty Comments:  No specialty comments available.  Imaging: Xr Lumbar Spine 2-3 Views  Result Date: 02/24/2016 AP and lateral flexion and extension radiographs, show DDD L3-4, L4-5 and L5-S1 with severe loss of disc height at the L4-5 and L5-S1 levels with straightening of the normal lordotic curve of the spine. Grade 1 spondylolisthesis anteriorly at L3-4 not worsened with Flexion. Calcification of the disc posteriorly at L3-4.     PMFS History: Patient Active Problem List   Diagnosis Date Noted  . HNP (herniated nucleus pulposus), cervical 05/16/2015    Priority: High    Class: Chronic  . Herniated nucleus pulposus, lumbar 05/16/2015  . Abnormal EKG 04/21/2015  . Thrombocytopenia ([place]) 08/18/2014  . Insomnia 11/24/2013  . Impingement syndrome of left shoulder 09/18/2013  . S/P arthroscopy of shoulder 09/18/2013  . Low back pain 03/17/2013  . Neuropathy ([place]) 03/17/2013  . Metabolic syndrome [id number]  . Overweight (BMI 25.0-29.9) 03/17/2013  . Vitamin D deficiency 12/01/2012  . Chronic insomnia 12/01/2012  . Fibrocystic breast changes 05/21/2011  . Essential hypertension   . Hyperlipidemia   . Peptic ulcer disease   . ITP (idiopathic thrombocytopenic purpura)   . Rhinitis   . Elevated blood sugar   . Hypothyroidism   . Goiter   . Colon polyps   . Adhesion of abdominal wall   . Esophageal stricture    Past Medical History:  Diagnosis Date  . Adhesion of abdominal wall    "continues to have abdominal pain pain intermittent right abdominal side. "scar tissue"  . Arthritis   . Breast cyst   . Bronchitis, asthmatic    'bronchitis that will go into an asthma like attack but haven't had it in years'  . Colon polyps   . Elevated blood sugar   . Esophageal stricture    "occ. problems with swallowing water"  . Full dentures   . GI bleed   . Goiter    . History of blood transfusion   . History of hiatal hernia    repaired over 20 years ago  . ITP (idiopathic thrombocytopenic purpura)    not a problem now.  . Other and unspecified hyperlipidemia   . Peptic ulcer disease   . Rhinitis   . Unspecified essential hypertension   . Unspecified hypothyroidism     Family History  Problem Relation Age of Onset  . Lung cancer Father   . Heart disease Mother     Unkown.  Died age 55  . Emphysema Sister     Past Surgical History:  Procedure Laterality Date  . ABDOMINAL HYSTERECTOMY    . APPENDECTOMY    . BACK SURGERY     x1 Lumbar, x2 cervical anterior and posterior .  Marland Kitchen BREAST SURGERY     x3 biopsy  . cataract surgery Bilateral   . CHOLECYSTECTOMY    .  EYE SURGERY Bilateral 2007   cataracts   . HEMORRHOID SURGERY    . HERNIA REPAIR    . JOINT REPLACEMENT    . LUMBAR LAMINECTOMY N/A 05/16/2015   Procedure: MICRODISCECTOMY Lumbar 3-Lumbar 4;  Surgeon: Jessy Oto, MD;  Location: [facility];  Service: Orthopedics;  Laterality: N/A;  . 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 THYROID SURGERY     Total "goiter removal"  . TOTAL KNEE ARTHROPLASTY Right 2010   Social History   Occupational History  . Not on file.   Social History Main Topics  . Smoking status: Passive Smoke Exposure - Never Smoker    Start date: 03/12/1945    Last attempt to quit: 03/12/1982  . Smokeless tobacco: Never Used  . Alcohol use No  . Drug use: No  . Sexual activity: Not Currently

## 2016-02-24 NOTE — Patient Instructions (Signed)
Avoid bending, stooping and avoid lifting weights greater than 10 lbs. Avoid prolong standing and walking. Avoid frequent bending and stooping  No lifting greater than 10 lbs. May use ice or moist heat for pain. Weight loss is of benefit. Handicap license is approved. Dr. Newton's secretary/Assistant will call to arrange for epidural steroid injection  

## 2016-02-27 ENCOUNTER — Other Ambulatory Visit: Payer: Self-pay | Admitting: Family Medicine

## 2016-03-14 ENCOUNTER — Ambulatory Visit (INDEPENDENT_AMBULATORY_CARE_PROVIDER_SITE_OTHER): Payer: Medicare Other | Admitting: Physical Medicine and Rehabilitation

## 2016-03-14 ENCOUNTER — Encounter (INDEPENDENT_AMBULATORY_CARE_PROVIDER_SITE_OTHER): Payer: Self-pay | Admitting: Physical Medicine and Rehabilitation

## 2016-03-14 VITALS — BP 143/87 | HR 86

## 2016-03-14 DIAGNOSIS — M5116 Intervertebral disc disorders with radiculopathy, lumbar region: Secondary | ICD-10-CM

## 2016-03-14 DIAGNOSIS — M5416 Radiculopathy, lumbar region: Secondary | ICD-10-CM | POA: Diagnosis not present

## 2016-03-14 MED ORDER — LIDOCAINE HCL (PF) 1 % IJ SOLN: 0.3300 mL | Freq: Once | INTRAMUSCULAR | Status: DC

## 2016-03-14 MED ORDER — METHYLPREDNISOLONE ACETATE 80 MG/ML IJ SUSP
80.0000 mg | Freq: Once | INTRAMUSCULAR | Status: AC
  Administered 2016-03-14: 80 mg

## 2016-03-14 NOTE — Procedures (Signed)
Lumbosacral Transforaminal Epidural Steroid Injection - Infraneural Approach with Fluoroscopic Guidance  Patient: [name]      Date of Birth: 1925-11-16 MRN: [id number] PCP: Redge Gainer, MD      Visit Date: 03/14/2016   Universal Protocol:    Date/Time: 01/03/182:16 PM  Consent Given By: the patient  Position: PRONE   Additional Comments: Vital signs were monitored before and after the procedure. Patient was prepped and draped in the usual sterile fashion. The correct patient, procedure, and site was verified.   Injection Procedure Details:  Procedure Site One Meds Administered:  Meds ordered this encounter  Medications  . lidocaine (PF) (XYLOCAINE) 1 % injection 0.3 mL  . methylPREDNISolone acetate (DEPO-MEDROL) injection 80 mg      Laterality: Left  Location/Site:  L4-L5 L5-S1  Needle size: 22 G  Needle type: Spinal  Needle Placement: Transforaminal  Findings:  -Contrast Used: 1 mL iohexol 180 mg iodine/mL   -Comments: Excellent flow of contrast along the nerve and into the epidural space.  Procedure Details: After squaring off the end-plates of the desired vertebral level to get a true AP view, the C-arm was obliqued to the painful side so that the superior articulating process is positioned about 1/3 the length of the inferior endplate.  The needle was aimed toward the junction of the superior articular process and the transverse process of the inferior vertebrae. The needle's initial entry is in the lower third of the foramen through Kambin's triangle. The soft tissues overlying this target were infiltrated with 2-3 ml. of 1% Lidocaine without Epinephrine.  The spinal needle was then inserted and advanced toward the target using a "trajectory" view along the fluoroscope beam.  Under AP and lateral visualization, the needle was advanced so it did not puncture dura and did not traverse medially beyond the 6 o'clock position of the pedicle. Bi-planar  projections were used to confirm position. Aspiration was confirmed to be negative for CSF and/or blood. A 1-2 ml. volume of Isovue-250 was injected and flow of contrast was noted at each level. Radiographs were obtained for documentation purposes.   After attaining the desired flow of contrast documented above, a 0.5 to 1.0 ml test dose of 0.25% Marcaine was injected into each respective transforaminal space.  The patient was observed for 90 seconds post injection.  After no sensory deficits were reported, and normal lower extremity motor function was noted,   the above injectate was administered so that equal amounts of the injectate were placed at each foramen (level) into the transforaminal epidural space.   Additional Comments:  The patient tolerated the procedure well Dressing: Band-Aid and 2x2 sterile gauze     Post-procedure details: Patient was observed during the procedure. Post-procedure instructions were reviewed.  Patient left the clinic in stable condition.

## 2016-03-14 NOTE — Progress Notes (Signed)
[name] - 83 y.o. female MRN [id number]  Date of birth: [birth year]  Office Visit Note: Visit Date: 03/14/2016 PCP: Redge Gainer, MD Referred by: Chipper Herb, MD  Subjective: Chief Complaint  Patient presents with  . Lower Back - Pain   HPI: [name] is very pleasant 83 year old female who unfortunately lost her husband fairly recently. She is complaining of left side low back radiating into left hip and occasionally into groin. Radiates down leg to foot at times. Worse with walking and standing. She is followed by Dr. Louanne Skye who request a left L4-L5 transforaminal epidural steroid injection. MRI showing recurrent disc herniation and lateral recess stenosis.    ROS Otherwise per HPI.  Assessment & Plan: Visit Diagnoses:  1. Lumbar radiculopathy   2. Radiculopathy due to lumbar intervertebral disc disorder     Plan: Findings:  Left L4-L5 transforaminal epidural steroid injection for left-sided radiculopathy.    Meds & Orders:  Meds ordered this encounter  Medications  . lidocaine (PF) (XYLOCAINE) 1 % injection 0.3 mL  . methylPREDNISolone acetate (DEPO-MEDROL) injection 80 mg    Orders Placed This Encounter  Procedures  . Epidural Steroid injection    Follow-up: Return for Scheduled follow-up with Dr. Louanne Skye.   Procedures: No procedures performed  Lumbosacral Transforaminal Epidural Steroid Injection - Infraneural Approach with Fluoroscopic Guidance  Patient: [name]      Date of Birth: 1925/10/28 MRN: [id number] PCP: Redge Gainer, MD      Visit Date: 03/14/2016   Universal Protocol:    Date/Time: 01/03/182:16 PM  Consent Given By: the patient  Position: PRONE   Additional Comments: Vital signs were monitored before and after the procedure. Patient was prepped and draped in the usual sterile fashion. The correct patient, procedure, and site was verified.   Injection Procedure Details:  Procedure Site One Meds Administered:    Meds ordered this encounter  Medications  . lidocaine (PF) (XYLOCAINE) 1 % injection 0.3 mL  . methylPREDNISolone acetate (DEPO-MEDROL) injection 80 mg      Laterality: Left  Location/Site:  L4-L5 L5-S1  Needle size: 22 G  Needle type: Spinal  Needle Placement: Transforaminal  Findings:  -Contrast Used: 1 mL iohexol 180 mg iodine/mL   -Comments: Excellent flow of contrast along the nerve and into the epidural space.  Procedure Details: After squaring off the end-plates of the desired vertebral level to get a true AP view, the C-arm was obliqued to the painful side so that the superior articulating process is positioned about 1/3 the length of the inferior endplate.  The needle was aimed toward the junction of the superior articular process and the transverse process of the inferior vertebrae. The needle's initial entry is in the lower third of the foramen through Kambin's triangle. The soft tissues overlying this target were infiltrated with 2-3 ml. of 1% Lidocaine without Epinephrine.  The spinal needle was then inserted and advanced toward the target using a "trajectory" view along the fluoroscope beam.  Under AP and lateral visualization, the needle was advanced so it did not puncture dura and did not traverse medially beyond the 6 o'clock position of the pedicle. Bi-planar projections were used to confirm position. Aspiration was confirmed to be negative for CSF and/or blood. A 1-2 ml. volume of Isovue-250 was injected and flow of contrast was noted at each level. Radiographs were obtained for documentation purposes.   After attaining the desired flow of contrast documented above, a 0.5 to 1.0 ml test  dose of 0.25% Marcaine was injected into each respective transforaminal space.  The patient was observed for 90 seconds post injection.  After no sensory deficits were reported, and normal lower extremity motor function was noted,   the above injectate was administered so that equal  amounts of the injectate were placed at each foramen (level) into the transforaminal epidural space.   Additional Comments:  The patient tolerated the procedure well Dressing: Band-Aid and 2x2 sterile gauze     Post-procedure details: Patient was observed during the procedure. Post-procedure instructions were reviewed.  Patient left the clinic in stable condition.     Clinical History: No specialty comments available.  She reports that she is a non-smoker but has been exposed to tobacco smoke. The exposure started about 71 years ago. She has never used smokeless tobacco. No results for input(s): HGBA1C, LABURIC in the last 8760 hours.  Objective:  VS:  HT:    WT:   BMI:     BP:(!) 143/87  HR:86bpm  TEMP: ( )  RESP:98 % Physical Exam  Musculoskeletal:  Patient relates with a walker with a forward flexed spine. She has good distal strength without deficit.    Ortho Exam Imaging: No results found.  Past Medical/Family/Surgical/Social History: Medications & Allergies reviewed per EMR Patient Active Problem List   Diagnosis Date Noted  . Lumbar radiculopathy 03/14/2016  . HNP (herniated nucleus pulposus), cervical 05/16/2015    Class: Chronic  . Herniated nucleus pulposus, lumbar 05/16/2015  . Abnormal EKG 04/21/2015  . Thrombocytopenia ([place]) 08/18/2014  . Insomnia 11/24/2013  . Impingement syndrome of left shoulder 09/18/2013  . S/P arthroscopy of shoulder 09/18/2013  . Low back pain 03/17/2013  . Neuropathy ([place]) 03/17/2013  . Metabolic syndrome [id number]  . Overweight (BMI 25.0-29.9) 03/17/2013  . Vitamin D deficiency 12/01/2012  . Chronic insomnia 12/01/2012  . Fibrocystic breast changes 05/21/2011  . Essential hypertension   . Hyperlipidemia   . Peptic ulcer disease   . ITP (idiopathic thrombocytopenic purpura)   . Rhinitis   . Elevated blood sugar   . Hypothyroidism   . Goiter   . Colon polyps   . Adhesion of abdominal wall   . Esophageal stricture      Past Medical History:  Diagnosis Date  . Adhesion of abdominal wall    "continues to have abdominal pain pain intermittent right abdominal side. "scar tissue"  . Arthritis   . Breast cyst   . Bronchitis, asthmatic    'bronchitis that will go into an asthma like attack but haven't had it in years'  . Colon polyps   . Elevated blood sugar   . Esophageal stricture    "occ. problems with swallowing water"  . Full dentures   . GI bleed   . Goiter   . History of blood transfusion   . History of hiatal hernia    repaired over 20 years ago  . ITP (idiopathic thrombocytopenic purpura)    not a problem now.  . Other and unspecified hyperlipidemia   . Peptic ulcer disease   . Rhinitis   . Unspecified essential hypertension   . Unspecified hypothyroidism    Family History  Problem Relation Age of Onset  . Lung cancer Father   . Heart disease Mother     Unkown.  Died age 66  . Emphysema Sister    Past Surgical History:  Procedure Laterality Date  . ABDOMINAL HYSTERECTOMY    . APPENDECTOMY    . BACK SURGERY  x1 Lumbar, x2 cervical anterior and posterior .  Marland Kitchen BREAST SURGERY     x3 biopsy  . cataract surgery Bilateral   . CHOLECYSTECTOMY    . EYE SURGERY Bilateral 2007   cataracts   . HEMORRHOID SURGERY    . HERNIA REPAIR    . JOINT REPLACEMENT    . LUMBAR LAMINECTOMY N/A 05/16/2015   Procedure: MICRODISCECTOMY Lumbar 3-Lumbar 4;  Surgeon: Jessy Oto, MD;  Location: [facility];  Service: Orthopedics;  Laterality: N/A;  . 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 THYROID SURGERY     Total "goiter removal"  . TOTAL KNEE ARTHROPLASTY Right 2010   Social History   Occupational History  . Not on file.   Social History Main Topics  . Smoking status: Passive Smoke Exposure - Never Smoker    Start date: 03/12/1945    Last attempt to quit: 03/12/1982  . Smokeless tobacco: Never Used  . Alcohol use No  . Drug use: No  . Sexual activity: Not Currently

## 2016-03-14 NOTE — Patient Instructions (Signed)

## 2016-03-26 ENCOUNTER — Ambulatory Visit (INDEPENDENT_AMBULATORY_CARE_PROVIDER_SITE_OTHER): Payer: Medicare Other | Admitting: Family Medicine

## 2016-03-26 ENCOUNTER — Encounter: Payer: Self-pay | Admitting: Family Medicine

## 2016-03-26 ENCOUNTER — Encounter (INDEPENDENT_AMBULATORY_CARE_PROVIDER_SITE_OTHER): Payer: Self-pay

## 2016-03-26 VITALS — BP 137/67 | HR 65 | Temp 96.9°F | Ht 64.0 in | Wt 144.0 lb

## 2016-03-26 DIAGNOSIS — D696 Thrombocytopenia, unspecified: Secondary | ICD-10-CM

## 2016-03-26 DIAGNOSIS — F5104 Psychophysiologic insomnia: Secondary | ICD-10-CM

## 2016-03-26 DIAGNOSIS — E7800 Pure hypercholesterolemia, unspecified: Secondary | ICD-10-CM | POA: Diagnosis not present

## 2016-03-26 DIAGNOSIS — G629 Polyneuropathy, unspecified: Secondary | ICD-10-CM | POA: Diagnosis not present

## 2016-03-26 DIAGNOSIS — E559 Vitamin D deficiency, unspecified: Secondary | ICD-10-CM

## 2016-03-26 DIAGNOSIS — I10 Essential (primary) hypertension: Secondary | ICD-10-CM | POA: Diagnosis not present

## 2016-03-26 MED ORDER — TRAZODONE HCL 50 MG PO TABS: 50.0000 mg | ORAL_TABLET | Freq: Every evening | ORAL | 1 refills | Status: DC | PRN

## 2016-03-26 NOTE — Addendum Note (Signed)
Addended by: Zannie Cove on: 03/26/2016 10:40 AM   Modules accepted: Orders

## 2016-03-26 NOTE — Patient Instructions (Addendum)
Medicare Annual Wellness Visit  [facility] and the medical providers at [facility] strive to bring you the best medical care.  In doing so we not only want to address your current medical conditions and concerns but also to detect new conditions early and prevent illness, disease and health-related problems.    Medicare offers a yearly Wellness Visit which allows our clinical staff to assess your need for preventative services including immunizations, lifestyle education, counseling to decrease risk of preventable diseases and screening for fall risk and other medical concerns.    This visit is provided free of charge (no copay) for all Medicare recipients. The clinical pharmacists at [facility] have begun to conduct these Wellness Visits which will also include a thorough review of all your medications.    As you primary medical provider recommend that you make an appointment for your Annual Wellness Visit if you have not done so already this year.  You may set up this appointment before you leave today or you may call back [phone]) and schedule an appointment.  Please make sure when you call that you mention that you are scheduling your Annual Wellness Visit with the clinical pharmacist so that the appointment may be made for the proper length of time.     Continue current medications. Continue good therapeutic lifestyle changes which include good diet and exercise. Fall precautions discussed with patient. If an FOBT was given today- please return it to our front desk. If you are over 30 years old - you may need Prevnar 81 or the adult Pneumonia vaccine.  **Flu shots are available--- please call and schedule a FLU-CLINIC appointment**  After your visit with [place] today you will receive a survey in the mail or online from [employer] regarding your care with [place]. Please take a moment to fill this out. Your feedback is very  important to [place] as you can help [place] better understand your patient needs as well as improve your experience and satisfaction. WE CARE ABOUT YOU!!!   The patient should continue to follow-up with orthopedist as planned Until the flu epidemic subsides she should be much more careful about being in crowds of people and should use good hand hygiene and pulmonary hygiene when she is out in the public. For the remainder of the winter months she should drink plenty of fluids and stay well hydrated

## 2016-03-26 NOTE — Progress Notes (Signed)
Subjective:    Patient ID: [name], female    DOB: 29-Sep-1925, 83 y.o.   MRN: [id number]  HPI Pt here for follow up and management of chronic medical problems which includes hyperlipidemia and hypertension. She is taking medications regularly.The patient today complains of insomnia. She also is concerned that her ears are sore and red at times. She just lost her husband a few weeks back and she still recovering from this and will be for a long time. She'll be given an FOBT to return and we'll get routine lab work today. The patient comes to the visit today with her son who actually lives with her. She is having some trouble with her heat at home and currently using a seizures. She says she is not sleeping at nighttime and just can't rest well. She is somewhat tearful in talking about her husband and how they would have been married 75 years as of recently if he had lived. She denies any chest pain palpitations or shortness of breath. She denies any trouble with her intestinal tract including nausea vomiting diarrhea blood in the stool or black tarry bowel movements. She is passing her water without problems.     Patient Active Problem List   Diagnosis Date Noted  . Lumbar radiculopathy 03/14/2016  . HNP (herniated nucleus pulposus), cervical 05/16/2015    Class: Chronic  . Herniated nucleus pulposus, lumbar 05/16/2015  . Abnormal EKG 04/21/2015  . Thrombocytopenia ([place]) 08/18/2014  . Insomnia 11/24/2013  . Impingement syndrome of left shoulder 09/18/2013  . S/P arthroscopy of shoulder 09/18/2013  . Low back pain 03/17/2013  . Neuropathy ([place]) 03/17/2013  . Metabolic syndrome 97/98/9211  . Overweight (BMI 25.0-29.9) 03/17/2013  . Vitamin D deficiency 12/01/2012  . Chronic insomnia 12/01/2012  . Fibrocystic breast changes 05/21/2011  . Essential hypertension   . Hyperlipidemia   . Peptic ulcer disease   . ITP (idiopathic thrombocytopenic purpura)   . Rhinitis   . Elevated  blood sugar   . Hypothyroidism   . Goiter   . Colon polyps   . Adhesion of abdominal wall   . Esophageal stricture    Outpatient Encounter Prescriptions as of 03/26/2016  Medication Sig  . atorvastatin (LIPITOR) 80 MG tablet TAKE 1 TABLET ONCE A DAY  . calcium citrate-vitamin D (CITRACAL+D) 315-200 MG-UNIT per tablet Take 1 tablet by mouth daily.  . Cholecalciferol (VITAMIN D3) 2000 UNITS TABS Take 1 tablet by mouth daily.    Marland Kitchen levothyroxine (LEVOTHROID) 25 MCG tablet Take 1/2 tablet (12.68mg) daily.  Along with 1530m of Synthroid  . levothyroxine (SYNTHROID, LEVOTHROID) 150 MCG tablet TAKE 1 TABLET DAILY  . methocarbamol (ROBAXIN) 500 MG tablet Take 1 tablet (500 mg total) by mouth every 8 (eight) hours as needed for muscle spasms.  . Multiple Vitamin (MULTIVITAMIN WITH MINERALS) TABS tablet Take 1 tablet by mouth daily.  . Marland Kitchenmeprazole (PRILOSEC) 20 MG capsule Take 20 mg by mouth daily.  . polyvinyl alcohol (LIQUIFILM TEARS) 1.4 % ophthalmic solution Place 1 drop into both eyes as needed for dry eyes.  . potassium chloride (K-DUR) 10 MEQ tablet Take 1 tablet (10 mEq total) by mouth daily.  . valsartan-hydrochlorothiazide (DIOVAN-HCT) 320-25 MG tablet TAKE 1 TABLET DAILY  . [DISCONTINUED] HYDROcodone-acetaminophen (NORCO/VICODIN) 5-325 MG tablet Take 1-2 tablets by mouth every 6 (six) hours as needed for moderate pain.   Facility-Administered Encounter Medications as of 03/26/2016  Medication  . lidocaine (PF) (XYLOCAINE) 1 % injection 0.3 mL  Review of Systems  Constitutional: Negative.        Not sleeping well  HENT: Positive for ear pain (soreness and redness at times).   Eyes: Negative.   Respiratory: Negative.   Cardiovascular: Negative.   Gastrointestinal: Negative.   Endocrine: Negative.   Genitourinary: Negative.   Musculoskeletal: Negative.   Skin: Negative.   Allergic/Immunologic: Negative.   Neurological: Negative.   Hematological: Negative.     Psychiatric/Behavioral: Negative.        Objective:   Physical Exam  Constitutional: She is oriented to person, place, and time. She appears well-developed and well-nourished. She appears distressed.  Pleasant and alert and emotional because of the recent death of her husband  HENT:  Head: Normocephalic and atraumatic.  Right Ear: External ear normal.  Nose: Nose normal.  Mouth/Throat: No oropharyngeal exudate.  Slight ear cerumen left ear canal  Eyes: Conjunctivae and EOM are normal. Pupils are equal, round, and reactive to light. Right eye exhibits no discharge. Left eye exhibits no discharge. No scleral icterus.  Neck: Normal range of motion. Neck supple. No thyromegaly present.  No bruits, thyromegaly or anterior cervical adenopathy  Cardiovascular: Normal rate, regular rhythm, normal heart sounds and intact distal pulses.   No murmur heard. The heart is regular at 72/m  Pulmonary/Chest: Effort normal and breath sounds normal. No respiratory distress. She has no wheezes. She has no rales.  Abdominal: Soft. Bowel sounds are normal. She exhibits no mass. There is no tenderness. There is no rebound and no guarding.  Musculoskeletal: She exhibits no edema or tenderness.  The patient uses a cane for ambulation because of chronic low back pain and lower extremity joint replacement.  Lymphadenopathy:    She has no cervical adenopathy.  Neurological: She is alert and oriented to person, place, and time. She has normal reflexes. No cranial nerve deficit.  Skin: Skin is warm and dry. No rash noted.  Psychiatric: She has a normal mood and affect. Her behavior is normal. Judgment and thought content normal.  Nursing note and vitals reviewed.  BP 137/67 (BP Location: Right Arm)   Pulse 65   Temp (!) 96.9 F (36.1 C) (Oral)   Ht 5' 4"  (1.626 m)   Wt 144 lb (65.3 kg)   BMI 24.72 kg/m \       Assessment & Plan:  1. Essential hypertension -The blood pressure is good today and she  will continue with current treatment - CBC with Differential/Platelet - BMP8+EGFR - Hepatic function panel  2. Pure hypercholesterolemia -Continue with current treatment pending results of lab work - CBC with Differential/Platelet - Lipid panel  3. Vitamin D deficiency -Continue with current treatment pending results of lab work - CBC with Differential/Platelet - VITAMIN D 25 Hydroxy (Vit-D Deficiency, Fractures)  4. Thrombocytopenia ([place]) -No history of any bleeding issues. - CBC with Differential/Platelet  5. Neuropathy ([place]) -The patient continues to have ongoing low back pain and neuropathy associated with this. - CBC with Differential/Platelet  6. Chronic insomnia -We will try trazodone 25 mg and we'll ask the son to call us back in 3 or 4 weeks regarding the progress with this medication and consider increasing the medicine up to 50 mg if not helping any.  No orders of the defined types were placed in this encounter.   Patient Instructions                       Medicare Annual Wellness Visit  [facility]  and the medical providers at [facility] strive to bring you the best medical care.  In doing so we not only want to address your current medical conditions and concerns but also to detect new conditions early and prevent illness, disease and health-related problems.    Medicare offers a yearly Wellness Visit which allows our clinical staff to assess your need for preventative services including immunizations, lifestyle education, counseling to decrease risk of preventable diseases and screening for fall risk and other medical concerns.    This visit is provided free of charge (no copay) for all Medicare recipients. The clinical pharmacists at [facility] have begun to conduct these Wellness Visits which will also include a thorough review of all your medications.    As you primary medical provider recommend that you make an  appointment for your Annual Wellness Visit if you have not done so already this year.  You may set up this appointment before you leave today or you may call back ([phone]) and schedule an appointment.  Please make sure when you call that you mention that you are scheduling your Annual Wellness Visit with the clinical pharmacist so that the appointment may be made for the proper length of time.     Continue current medications. Continue good therapeutic lifestyle changes which include good diet and exercise. Fall precautions discussed with patient. If an FOBT was given today- please return it to our front desk. If you are over 44 years old - you may need Prevnar 40 or the adult Pneumonia vaccine.  **Flu shots are available--- please call and schedule a FLU-CLINIC appointment**  After your visit with [place] today you will receive a survey in the mail or online from [employer] regarding your care with [place]. Please take a moment to fill this out. Your feedback is very important to [place] as you can help [place] better understand your patient needs as well as improve your experience and satisfaction. WE CARE ABOUT YOU!!!   The patient should continue to follow-up with orthopedist as planned Until the flu epidemic subsides she should be much more careful about being in crowds of people and should use good hand hygiene and pulmonary hygiene when she is out in the public. For the remainder of the winter months she should drink plenty of fluids and stay well hydrated  Arrie Senate MD

## 2016-03-27 LAB — LIPID PANEL
CHOL/HDL RATIO: 2 ratio (ref 0.0–4.4)
Cholesterol, Total: 159 mg/dL (ref 100–199)
HDL: 80 mg/dL (ref >39)
LDL Calculated: 58 mg/dL (ref 0–99)
TRIGLYCERIDES: 105 mg/dL (ref 0–149)
VLDL Cholesterol Cal: 21 mg/dL (ref 5–40)

## 2016-03-27 LAB — CBC WITH DIFFERENTIAL/PLATELET
Basophils Absolute: 0 10*3/uL (ref 0.0–0.2)
Basos: 0 %
EOS (ABSOLUTE): 0.3 10*3/uL (ref 0.0–0.4)
EOS: 3 %
HEMATOCRIT: 40.3 % (ref 34.0–46.6)
Hemoglobin: 13.4 g/dL (ref 11.1–15.9)
Immature Grans (Abs): 0.1 10*3/uL (ref 0.0–0.1)
Immature Granulocytes: 1 %
LYMPHS ABS: 3.8 10*3/uL — AB (ref 0.7–3.1)
Lymphs: 34 %
MCH: 30.9 pg (ref 26.6–33.0)
MCHC: 33.3 g/dL (ref 31.5–35.7)
MCV: 93 fL (ref 79–97)
MONOS ABS: 0.5 10*3/uL (ref 0.1–0.9)
Monocytes: 4 %
NEUTROS ABS: 6.6 10*3/uL (ref 1.4–7.0)
Neutrophils: 58 %
PLATELETS: 145 10*3/uL — AB (ref 150–379)
RBC: 4.34 x10E6/uL (ref 3.77–5.28)
RDW: 14 % (ref 12.3–15.4)
WBC: 11.3 10*3/uL — ABNORMAL HIGH (ref 3.4–10.8)

## 2016-03-27 LAB — BMP8+EGFR
BUN / CREAT RATIO: 17 (ref 12–28)
BUN: 15 mg/dL (ref 8–27)
CHLORIDE: 102 mmol/L (ref 96–106)
CO2: 24 mmol/L (ref 18–29)
Calcium: 9.2 mg/dL (ref 8.7–10.3)
Creatinine, Ser: 0.9 mg/dL (ref 0.57–1.00)
GFR, EST AFRICAN AMERICAN: 66 mL/min/{1.73_m2} (ref >59)
GFR, EST NON AFRICAN AMERICAN: 57 mL/min/{1.73_m2} — AB (ref >59)
Glucose: 115 mg/dL — ABNORMAL HIGH (ref 65–99)
POTASSIUM: 3.5 mmol/L (ref 3.5–5.2)
Sodium: 142 mmol/L (ref 134–144)

## 2016-03-27 LAB — HEPATIC FUNCTION PANEL
ALT: 11 IU/L (ref 0–32)
AST: 17 IU/L (ref 0–40)
Albumin: 4 g/dL (ref 3.5–4.7)
Alkaline Phosphatase: 83 IU/L (ref 39–117)
BILIRUBIN TOTAL: 0.6 mg/dL (ref 0.0–1.2)
BILIRUBIN, DIRECT: 0.19 mg/dL (ref 0.00–0.40)
Total Protein: 7.4 g/dL (ref 6.0–8.5)

## 2016-03-27 LAB — VITAMIN D 25 HYDROXY (VIT D DEFICIENCY, FRACTURES): VIT D 25 HYDROXY: 33.9 ng/mL (ref 30.0–100.0)

## 2016-04-02 ENCOUNTER — Telehealth: Payer: Self-pay | Admitting: Family Medicine

## 2016-04-03 NOTE — Telephone Encounter (Signed)
Try increasing trazodone to 100 mg from 50 mg nightly and call back in one week for progress

## 2016-04-03 NOTE — Telephone Encounter (Signed)
Pt states she was to call us about how the trazodone is helping her sleep == it doesn't help at all  - she thinks she needs something different.

## 2016-04-03 NOTE — Telephone Encounter (Signed)
Pt aware.

## 2016-04-06 ENCOUNTER — Ambulatory Visit (INDEPENDENT_AMBULATORY_CARE_PROVIDER_SITE_OTHER): Payer: Medicare Other | Admitting: Specialist

## 2016-04-11 ENCOUNTER — Encounter (INDEPENDENT_AMBULATORY_CARE_PROVIDER_SITE_OTHER): Payer: Self-pay

## 2016-04-11 ENCOUNTER — Ambulatory Visit (INDEPENDENT_AMBULATORY_CARE_PROVIDER_SITE_OTHER): Payer: Medicare Other | Admitting: Specialist

## 2016-04-11 VITALS — BP 142/64 | HR 63 | Ht 64.0 in | Wt 144.0 lb

## 2016-04-11 DIAGNOSIS — Z5321 Procedure and treatment not carried out due to patient leaving prior to being seen by health care provider: Secondary | ICD-10-CM

## 2016-04-11 NOTE — Progress Notes (Signed)
Office Visit Note   Patient: [name]           Date of Birth: 06-03-1925           MRN: [id number] Visit Date: 04/11/2016              Requested by: Chipper Herb, MD [address], [place] [address] PCP: Redge Gainer, MD   Assessment & Plan: Visit Diagnoses:  1. Patient left without being seen     Plan: Patient left without being see, reports that her ride could not stay longer.    Follow-Up Instructions: Return if symptoms worsen or fail to improve.   Orders:  No orders of the defined types were placed in this encounter.  No orders of the defined types were placed in this encounter.     Procedures: No procedures performed   Clinical Data: No additional findings.   Subjective: Chief Complaint  Patient presents with  . Lower Back - Follow-up    Ms. Widmark is follow ing up today after her Left L4-5, L5-S1 Transforaminal injection that she had done with Dr. Ernestina Patches on 03/14/2016.  She states that it has helped, but she is still having some pain, but it is not as bad as it was.    Review of Systems   Objective: Vital Signs: BP (!) 142/64 (BP Location: Left Arm, Patient Position: Sitting)   Pulse 63   Ht 5\' 4"  (1.626 m)   Wt 144 lb (65.3 kg)   BMI 24.72 kg/m   Physical Exam  Ortho Exam  Specialty Comments:  No specialty comments available.  Imaging: No results found.   PMFS History: Patient Active Problem List   Diagnosis Date Noted  . HNP (herniated nucleus pulposus), cervical 05/16/2015    Priority: High    Class: Chronic  . Lumbar radiculopathy 03/14/2016  . Herniated nucleus pulposus, lumbar 05/16/2015  . Abnormal EKG 04/21/2015  . Thrombocytopenia ([place]) 08/18/2014  . Insomnia 11/24/2013  . Impingement syndrome of left shoulder 09/18/2013  . S/P arthroscopy of shoulder 09/18/2013  . Low back pain 03/17/2013  . Neuropathy ([place]) 03/17/2013  . Metabolic syndrome [id number]  . Overweight (BMI 25.0-29.9) 03/17/2013  .  Vitamin D deficiency 12/01/2012  . Chronic insomnia 12/01/2012  . Fibrocystic breast changes 05/21/2011  . Essential hypertension   . Hyperlipidemia   . Peptic ulcer disease   . ITP (idiopathic thrombocytopenic purpura)   . Rhinitis   . Elevated blood sugar   . Hypothyroidism   . Goiter   . Colon polyps   . Adhesion of abdominal wall   . Esophageal stricture    Past Medical History:  Diagnosis Date  . Adhesion of abdominal wall    "continues to have abdominal pain pain intermittent right abdominal side. "scar tissue"  . Arthritis   . Breast cyst   . Bronchitis, asthmatic    'bronchitis that will go into an asthma like attack but haven't had it in years'  . Colon polyps   . Elevated blood sugar   . Esophageal stricture    "occ. problems with swallowing water"  . Full dentures   . GI bleed   . Goiter   . History of blood transfusion   . History of hiatal hernia    repaired over 20 years ago  . ITP (idiopathic thrombocytopenic purpura)    not a problem now.  . Other and unspecified hyperlipidemia   . Peptic ulcer disease   . Rhinitis   .  Unspecified essential hypertension   . Unspecified hypothyroidism     Family History  Problem Relation Age of Onset  . Lung cancer Father   . Heart disease Mother     Unkown.  Died age 42  . Emphysema Sister     Past Surgical History:  Procedure Laterality Date  . ABDOMINAL HYSTERECTOMY    . APPENDECTOMY    . BACK SURGERY     x1 Lumbar, x2 cervical anterior and posterior .  Marland Kitchen BREAST SURGERY     x3 biopsy  . cataract surgery Bilateral   . CHOLECYSTECTOMY    . EYE SURGERY Bilateral 2007   cataracts   . HEMORRHOID SURGERY    . HERNIA REPAIR    . JOINT REPLACEMENT    . LUMBAR LAMINECTOMY N/A 05/16/2015   Procedure: MICRODISCECTOMY Lumbar 3-Lumbar 4;  Surgeon: Jessy Oto, MD;  Location: [facility];  Service: Orthopedics;  Laterality: N/A;  . 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 THYROID SURGERY     Total  "goiter removal"  . TOTAL KNEE ARTHROPLASTY Right 2010   Social History   Occupational History  . Not on file.   Social History Main Topics  . Smoking status: Passive Smoke Exposure - Never Smoker    Start date: 03/12/1945    Last attempt to quit: 03/12/1982  . Smokeless tobacco: Never Used  . Alcohol use No  . Drug use: No  . Sexual activity: Not Currently

## 2016-04-12 ENCOUNTER — Other Ambulatory Visit: Payer: Self-pay | Admitting: *Deleted

## 2016-04-12 MED ORDER — TRAZODONE HCL 50 MG PO TABS: 100.0000 mg | ORAL_TABLET | Freq: Every evening | ORAL | 0 refills | Status: DC | PRN

## 2016-04-12 NOTE — Telephone Encounter (Signed)
Per 04/03/16 ov note

## 2016-04-17 ENCOUNTER — Encounter (INDEPENDENT_AMBULATORY_CARE_PROVIDER_SITE_OTHER): Payer: Self-pay | Admitting: Specialist

## 2016-04-17 NOTE — Patient Instructions (Signed)
Patient left without being see, reports that her ride could not stay longer.

## 2016-04-18 ENCOUNTER — Ambulatory Visit (INDEPENDENT_AMBULATORY_CARE_PROVIDER_SITE_OTHER): Payer: Medicare Other | Admitting: Specialist

## 2016-04-20 ENCOUNTER — Ambulatory Visit (INDEPENDENT_AMBULATORY_CARE_PROVIDER_SITE_OTHER): Payer: Medicare Other | Admitting: Specialist

## 2016-04-26 ENCOUNTER — Other Ambulatory Visit: Payer: Self-pay | Admitting: Family Medicine

## 2016-05-24 ENCOUNTER — Encounter (INDEPENDENT_AMBULATORY_CARE_PROVIDER_SITE_OTHER): Payer: Self-pay

## 2016-05-24 ENCOUNTER — Encounter (INDEPENDENT_AMBULATORY_CARE_PROVIDER_SITE_OTHER): Payer: Self-pay | Admitting: Specialist

## 2016-05-24 ENCOUNTER — Ambulatory Visit (INDEPENDENT_AMBULATORY_CARE_PROVIDER_SITE_OTHER): Payer: Medicare Other | Admitting: Specialist

## 2016-05-24 VITALS — BP 155/70 | HR 71 | Ht 64.0 in | Wt 144.0 lb

## 2016-05-24 DIAGNOSIS — F5105 Insomnia due to other mental disorder: Secondary | ICD-10-CM

## 2016-05-24 DIAGNOSIS — M4316 Spondylolisthesis, lumbar region: Secondary | ICD-10-CM

## 2016-05-24 DIAGNOSIS — F4321 Adjustment disorder with depressed mood: Secondary | ICD-10-CM | POA: Diagnosis not present

## 2016-05-24 MED ORDER — DULOXETINE HCL 20 MG PO CPEP: 20.0000 mg | ORAL_CAPSULE | Freq: Every day | ORAL | 1 refills | Status: DC

## 2016-05-24 NOTE — Patient Instructions (Addendum)
Avoid frequent bending and stooping  No lifting greater than 10 lbs. May use ice or moist heat for pain. Weight loss is of benefit. Handicap license is approved. Avoid bending, stooping and avoid lifting weights greater than 10 lbs. Avoid prolong standing and walking. Avoid frequent bending and stooping   Start cymbalta 20 mg po qday with meal or snack. Take tylenol 500 mg po up to four times a day. Lumbar corsett for pain to be used half days.

## 2016-05-24 NOTE — Progress Notes (Addendum)
Office Visit Note   Patient: [name]           Date of Birth: [birth year]           MRN: [id number] Visit Date: 05/24/2016              Requested by: Chipper Herb, MD [address] [place], [place] [address] PCP: Redge Gainer, MD   Assessment & Plan: Visit Diagnoses:  1. Spondylolisthesis, lumbar region   2. Insomnia secondary to situational depression     Plan: Avoid frequent bending and stooping  No lifting greater than 10 lbs. May use ice or moist heat for pain. Weight loss is of benefit. Handicap license is approved. Avoid bending, stooping and avoid lifting weights greater than 10 lbs. Avoid prolong standing and walking. Avoid frequent bending and stooping   Start cymbalta 20 mg po qday with meal or snack. Take tylenol 500 mg po up to four times a day. Lumbar corsett for pain to be used half days.  Follow-Up Instructions: Return in about 3 months (around 08/24/2016).   Orders:  No orders of the defined types were placed in this encounter.  No orders of the defined types were placed in this encounter.     Procedures: No procedures performed   Clinical Data: No additional findings.   Subjective: Chief Complaint  Patient presents with  . Lower Back - Follow-up, Pain    [name] is following up today after her Left L4-5, L5-S1 Transforaminal injection that she had done with Dr. Ernestina Patches on 03/14/2016.  She states that it has helped for a while but she is having pain about all the time again, states that the pain is worse at night than during the day.    Review of Systems   Objective: Vital Signs: BP (!) 155/70 (BP Location: Left Arm, Patient Position: Sitting)   Pulse 71   Ht 5\' 4"  (1.626 m)   Wt 144 lb (65.3 kg)   BMI 24.72 kg/m   Physical Exam  Ortho Exam  Specialty Comments:  No specialty comments available.  Imaging: No results found.   PMFS History: Patient Active Problem List   Diagnosis Date Noted  . HNP (herniated  nucleus pulposus), cervical 05/16/2015    Priority: High    Class: Chronic  . Lumbar radiculopathy 03/14/2016  . Herniated nucleus pulposus, lumbar 05/16/2015  . Abnormal EKG 04/21/2015  . Thrombocytopenia ([place]) 08/18/2014  . Insomnia 11/24/2013  . Impingement syndrome of left shoulder 09/18/2013  . S/P arthroscopy of shoulder 09/18/2013  . Low back pain 03/17/2013  . Neuropathy ([place]) 03/17/2013  . Metabolic syndrome 64/33/2951  . Overweight (BMI 25.0-29.9) 03/17/2013  . Vitamin D deficiency 12/01/2012  . Chronic insomnia 12/01/2012  . Fibrocystic breast changes 05/21/2011  . Essential hypertension   . Hyperlipidemia   . Peptic ulcer disease   . ITP (idiopathic thrombocytopenic purpura)   . Rhinitis   . Elevated blood sugar   . Hypothyroidism   . Goiter   . Colon polyps   . Adhesion of abdominal wall   . Esophageal stricture    Past Medical History:  Diagnosis Date  . Adhesion of abdominal wall    "continues to have abdominal pain pain intermittent right abdominal side. "scar tissue"  . Arthritis   . Breast cyst   . Bronchitis, asthmatic    'bronchitis that will go into an asthma like attack but haven't had it in years'  . Colon polyps   .  Elevated blood sugar   . Esophageal stricture    "occ. problems with swallowing water"  . Full dentures   . GI bleed   . Goiter   . History of blood transfusion   . History of hiatal hernia    repaired over 20 years ago  . ITP (idiopathic thrombocytopenic purpura)    not a problem now.  . Other and unspecified hyperlipidemia   . Peptic ulcer disease   . Rhinitis   . Unspecified essential hypertension   . Unspecified hypothyroidism     Family History  Problem Relation Age of Onset  . Lung cancer Father   . Heart disease Mother     Unkown.  Died age 61  . Emphysema Sister     Past Surgical History:  Procedure Laterality Date  . ABDOMINAL HYSTERECTOMY    . APPENDECTOMY    . BACK SURGERY     x1 Lumbar, x2 cervical  anterior and posterior .  Marland Kitchen BREAST SURGERY     x3 biopsy  . cataract surgery Bilateral   . CHOLECYSTECTOMY    . EYE SURGERY Bilateral 2007   cataracts   . HEMORRHOID SURGERY    . HERNIA REPAIR    . JOINT REPLACEMENT    . LUMBAR LAMINECTOMY N/A 05/16/2015   Procedure: MICRODISCECTOMY Lumbar 3-Lumbar 4;  Surgeon: Jessy Oto, MD;  Location: [facility];  Service: Orthopedics;  Laterality: N/A;  . 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 THYROID SURGERY     Total "goiter removal"  . TOTAL KNEE ARTHROPLASTY Right 2010   Social History   Occupational History  . Not on file.   Social History Main Topics  . Smoking status: Passive Smoke Exposure - Never Smoker    Start date: 03/12/1945    Last attempt to quit: 03/12/1982  . Smokeless tobacco: Never Used  . Alcohol use No  . Drug use: No  . Sexual activity: Not Currently

## 2016-05-31 ENCOUNTER — Telehealth: Payer: Self-pay | Admitting: Family Medicine

## 2016-05-31 MED ORDER — LORAZEPAM 0.5 MG PO TABS: 0.5000 mg | ORAL_TABLET | Freq: Every day | ORAL | 1 refills | Status: DC

## 2016-05-31 NOTE — Telephone Encounter (Signed)
Can try lorazepam 0.5 one at night #15 one refill

## 2016-05-31 NOTE — Telephone Encounter (Signed)
Granddaughter Tiffiny Worthy on [place] called pt is not sleeping but maybe 3 hrs a night, she is not sleeping during the day. She quit taking her trazadone weeks ago since it does not help her at all. She does take her cymbalta in the morning as she is suppose to. She has tried Ambien in the past which has not worked. Asking about trying low dose of Lorazepam, uses [employer] [facility]

## 2016-05-31 NOTE — Telephone Encounter (Signed)
LMOVM to [place] [facility] called to pharmacy Rx called to [facility]

## 2016-06-04 ENCOUNTER — Other Ambulatory Visit: Payer: Self-pay | Admitting: Family Medicine

## 2016-06-08 ENCOUNTER — Other Ambulatory Visit: Payer: Self-pay | Admitting: Family Medicine

## 2016-07-05 ENCOUNTER — Ambulatory Visit (INDEPENDENT_AMBULATORY_CARE_PROVIDER_SITE_OTHER): Payer: Medicare Other | Admitting: Specialist

## 2016-07-12 ENCOUNTER — Other Ambulatory Visit (INDEPENDENT_AMBULATORY_CARE_PROVIDER_SITE_OTHER): Payer: Self-pay | Admitting: Radiology

## 2016-07-12 DIAGNOSIS — F4321 Adjustment disorder with depressed mood: Secondary | ICD-10-CM

## 2016-07-12 DIAGNOSIS — M4316 Spondylolisthesis, lumbar region: Secondary | ICD-10-CM

## 2016-07-12 DIAGNOSIS — F5105 Insomnia due to other mental disorder: Secondary | ICD-10-CM

## 2016-07-12 MED ORDER — DULOXETINE HCL 20 MG PO CPEP: 20.0000 mg | ORAL_CAPSULE | Freq: Every day | ORAL | 1 refills | Status: DC

## 2016-07-13 DIAGNOSIS — F4323 Adjustment disorder with mixed anxiety and depressed mood: Secondary | ICD-10-CM | POA: Diagnosis not present

## 2016-07-16 DIAGNOSIS — F4323 Adjustment disorder with mixed anxiety and depressed mood: Secondary | ICD-10-CM | POA: Diagnosis not present

## 2016-08-04 ENCOUNTER — Other Ambulatory Visit: Payer: Self-pay | Admitting: Family Medicine

## 2016-08-07 DIAGNOSIS — F4323 Adjustment disorder with mixed anxiety and depressed mood: Secondary | ICD-10-CM | POA: Diagnosis not present

## 2016-08-09 ENCOUNTER — Encounter: Payer: Self-pay | Admitting: Family Medicine

## 2016-08-09 ENCOUNTER — Ambulatory Visit (INDEPENDENT_AMBULATORY_CARE_PROVIDER_SITE_OTHER): Payer: Medicare Other | Admitting: Family Medicine

## 2016-08-09 VITALS — BP 129/70 | HR 62 | Temp 97.8°F | Ht 64.0 in | Wt 154.0 lb

## 2016-08-09 DIAGNOSIS — E559 Vitamin D deficiency, unspecified: Secondary | ICD-10-CM | POA: Diagnosis not present

## 2016-08-09 DIAGNOSIS — I10 Essential (primary) hypertension: Secondary | ICD-10-CM

## 2016-08-09 DIAGNOSIS — R059 Cough, unspecified: Secondary | ICD-10-CM

## 2016-08-09 DIAGNOSIS — J301 Allergic rhinitis due to pollen: Secondary | ICD-10-CM | POA: Diagnosis not present

## 2016-08-09 DIAGNOSIS — E7800 Pure hypercholesterolemia, unspecified: Secondary | ICD-10-CM | POA: Diagnosis not present

## 2016-08-09 DIAGNOSIS — D696 Thrombocytopenia, unspecified: Secondary | ICD-10-CM | POA: Diagnosis not present

## 2016-08-09 DIAGNOSIS — E039 Hypothyroidism, unspecified: Secondary | ICD-10-CM | POA: Diagnosis not present

## 2016-08-09 DIAGNOSIS — R05 Cough: Secondary | ICD-10-CM

## 2016-08-09 MED ORDER — FLUTICASONE PROPIONATE 50 MCG/ACT NA SUSP: 2.0000 | Freq: Every day | NASAL | 6 refills | Status: DC

## 2016-08-09 NOTE — Progress Notes (Signed)
Subjective:    Patient ID: [name], female    DOB: 16-Nov-1925, 83 y.o.   MRN: [id number]  HPI Pt here for follow up and management of chronic medical problems which includes hyperlipidemia and hypertension. She is taking medication regularly.The patient has been seeing a therapist and some of her medicines have been changed. She is due to return an FOBT and will get lab work today. She comes to the visit today with her son Konrad Dolores. The patient has a history of hyperlipidemia chronic back pain anxiety and hypothyroidism. She also takes Diovan/HCT for her blood pressure. The patient denies any chest pain or shortness of breath. She does have a lot of allergies and this is making her cough more. She denies any trouble with her stomach as far as swallowing heartburn indigestion nausea vomiting or blood in the stool. She does say that when her allergies are acting up she has more congestion in her throat in this affects her coughing. She denies any trouble with passing her water and does wear a depends for this. Her medications were adjusted with an increased dose of Cymbalta and an increased dose of trazodone. She is resting better and feeling better with this dosage change.    Patient Active Problem List   Diagnosis Date Noted  . Lumbar radiculopathy 03/14/2016  . HNP (herniated nucleus pulposus), cervical 05/16/2015    Class: Chronic  . Herniated nucleus pulposus, lumbar 05/16/2015  . Abnormal EKG 04/21/2015  . Thrombocytopenia ([place]) 08/18/2014  . Insomnia 11/24/2013  . Impingement syndrome of left shoulder 09/18/2013  . S/P arthroscopy of shoulder 09/18/2013  . Low back pain 03/17/2013  . Neuropathy 03/17/2013  . Metabolic syndrome 41/63/8453  . Overweight (BMI 25.0-29.9) 03/17/2013  . Vitamin D deficiency 12/01/2012  . Chronic insomnia 12/01/2012  . Fibrocystic breast changes 05/21/2011  . Essential hypertension   . Hyperlipidemia   . Peptic ulcer disease   . ITP (idiopathic  thrombocytopenic purpura)   . Rhinitis   . Elevated blood sugar   . Hypothyroidism   . Goiter   . Colon polyps   . Adhesion of abdominal wall   . Esophageal stricture    Outpatient Encounter Prescriptions as of 08/09/2016  Medication Sig  . atorvastatin (LIPITOR) 80 MG tablet TAKE 1 TABLET ONCE A DAY  . calcium citrate-vitamin D (CITRACAL+D) 315-200 MG-UNIT per tablet Take 1 tablet by mouth daily.  . Cholecalciferol (VITAMIN D3) 2000 UNITS TABS Take 1 tablet by mouth daily.    . DULoxetine (CYMBALTA) 20 MG capsule Take 1 capsule (20 mg total) by mouth daily.  Marland Kitchen levothyroxine (LEVOTHROID) 25 MCG tablet Take 1/2 tablet (12.65mg) daily.  Along with 1564m of Synthroid  . levothyroxine (SYNTHROID, LEVOTHROID) 150 MCG tablet TAKE 1 TABLET DAILY  . LORazepam (ATIVAN) 0.5 MG tablet Take 1 tablet (0.5 mg total) by mouth at bedtime.  . methocarbamol (ROBAXIN) 500 MG tablet Take 1 tablet (500 mg total) by mouth every 8 (eight) hours as needed for muscle spasms.  . Multiple Vitamin (MULTIVITAMIN WITH MINERALS) TABS tablet Take 1 tablet by mouth daily.  . Marland Kitchenmeprazole (PRILOSEC) 20 MG capsule Take 20 mg by mouth daily.  . polyvinyl alcohol (LIQUIFILM TEARS) 1.4 % ophthalmic solution Place 1 drop into both eyes as needed for dry eyes.  . potassium chloride (K-DUR) 10 MEQ tablet Take 1 tablet (10 mEq total) by mouth daily.  . traZODone (DESYREL) 50 MG tablet TAKE UP TO 2 TABLETS AT BEDTIME FOR SLEEP  .  valsartan-hydrochlorothiazide (DIOVAN-HCT) 320-25 MG tablet TAKE 1 TABLET DAILY   Facility-Administered Encounter Medications as of 08/09/2016  Medication  . lidocaine (PF) (XYLOCAINE) 1 % injection 0.3 mL      Review of Systems  Constitutional: Negative.   HENT: Negative.   Eyes: Negative.   Respiratory: Negative.   Cardiovascular: Negative.   Gastrointestinal: Negative.   Endocrine: Negative.   Genitourinary: Negative.   Musculoskeletal: Negative.   Skin: Negative.   Allergic/Immunologic:  Negative.   Neurological: Negative.   Hematological: Negative.   Psychiatric/Behavioral: Negative.        Down, depressed since loss of husband       Objective:   Physical Exam  Constitutional: She is oriented to person, place, and time. She appears well-developed and well-nourished.  The patient is pleasant and smiling and feeling better regarding the loss of her husband. She has a lot of support at home especially the son brings her to the doctor today.  HENT:  Head: Normocephalic and atraumatic.  Right Ear: External ear normal.  Left Ear: External ear normal.  Mouth/Throat: Oropharynx is clear and moist. No oropharyngeal exudate.  Nasal congestion bilaterally  Eyes: Conjunctivae and EOM are normal. Pupils are equal, round, and reactive to light. Right eye exhibits no discharge. Left eye exhibits no discharge. No scleral icterus.  Neck: Normal range of motion. Neck supple. No thyromegaly present.  No bruits thyromegaly or anterior cervical adenopathy  Cardiovascular: Normal rate, regular rhythm, normal heart sounds and intact distal pulses.   No murmur heard. Heart has a regular rate and rhythm at 72/m  Pulmonary/Chest: Effort normal and breath sounds normal. No respiratory distress. She has no wheezes. She has no rales.  Lungs are clear with a dry cough.  Abdominal: Soft. Bowel sounds are normal. She exhibits no mass. There is no tenderness. There is no rebound and no guarding.  The patient has scarring on her abdomen as result of her previous surgeries. There is no abdominal tenderness or organ enlargement bruits or masses and no inguinal adenopathy.  Musculoskeletal: Normal range of motion. She exhibits no edema.  The patient has somewhat hesitant range of motion especially due to her back pain and has to be careful with her movements. She is using her rolling walker for getting in and out of the office and uses a cane at home.  Lymphadenopathy:    She has no cervical adenopathy.    Neurological: She is alert and oriented to person, place, and time. She has normal reflexes. No cranial nerve deficit.  Skin: Skin is warm and dry. No rash noted.  Psychiatric: She has a normal mood and affect. Her behavior is normal. Judgment and thought content normal.  Nursing note and vitals reviewed.   BP 129/70 (BP Location: Left Arm)   Pulse 62   Temp 97.8 F (36.6 C) (Oral)   Ht 5' 4"  (1.626 m)   Wt 154 lb (69.9 kg)   BMI 26.43 kg/m        Assessment & Plan:  1. Essential hypertension -The blood pressure is good today and she will continue with current treatment - CBC with Differential/Platelet - BMP8+EGFR - Hepatic function panel  2. Pure hypercholesterolemia -Continue with atorvastatin and with as aggressive diet as possible with physical activity to help lose weight. - CBC with Differential/Platelet - Lipid panel  3. Vitamin D deficiency -Continue with current treatment pending results of lab work - CBC with Differential/Platelet - VITAMIN D 25 Hydroxy (Vit-D Deficiency, Fractures)  4. Thrombocytopenia ([place]) -The patient has no increased signs of bleeding or bruising today. - CBC with Differential/Platelet  5. Hypothyroidism, unspecified type -Continue with current treatment pending results of lab work - CBC with Differential/Platelet - Thyroid Panel With TSH  6. Seasonal allergic rhinitis due to pollen -Flonase 1 spray each nostril at bedtime  7. Cough -Mucinex maximum strength, blue and white in color one twice daily with a large glass of water for cough and congestion  Meds ordered this encounter  Medications  . fluticasone (FLONASE) 50 MCG/ACT nasal spray    Sig: Place 2 sprays into both nostrils daily.    Dispense:  16 g    Refill:  6   Patient Instructions                       Medicare Annual Wellness Visit  [facility] and the medical providers at [facility] strive to bring you the best medical care.  In doing so  we not only want to address your current medical conditions and concerns but also to detect new conditions early and prevent illness, disease and health-related problems.    Medicare offers a yearly Wellness Visit which allows our clinical staff to assess your need for preventative services including immunizations, lifestyle education, counseling to decrease risk of preventable diseases and screening for fall risk and other medical concerns.    This visit is provided free of charge (no copay) for all Medicare recipients. The clinical pharmacists at [facility] have begun to conduct these Wellness Visits which will also include a thorough review of all your medications.    As you primary medical provider recommend that you make an appointment for your Annual Wellness Visit if you have not done so already this year.  You may set up this appointment before you leave today or you may call back ([phone]) and schedule an appointment.  Please make sure when you call that you mention that you are scheduling your Annual Wellness Visit with the clinical pharmacist so that the appointment may be made for the proper length of time.     Continue current medications. Continue good therapeutic lifestyle changes which include good diet and exercise. Fall precautions discussed with patient. If an FOBT was given today- please return it to our front desk. If you are over 62 years old - you may need Prevnar 100 or the adult Pneumonia vaccine.  **Flu shots are available--- please call and schedule a FLU-CLINIC appointment**  After your visit with [place] today you will receive a survey in the mail or online from [employer] regarding your care with [place]. Please take a moment to fill this out. Your feedback is very important to [place] as you can help [place] better understand your patient needs as well as improve your experience and satisfaction. WE CARE ABOUT YOU!!!  Use Flonase regularly 1 spray each nostril  nightly  Take Mucinex, blue and white in color one twice daily fr couh and congestion with a lage glass of water Avoid environments which should call or she did cough and sneeze more Drink plenty of fluids and stay well hydrated the summer We will call with lab work results as soon as those results become available   Arrie Senate MD

## 2016-08-09 NOTE — Patient Instructions (Addendum)
Medicare Annual Wellness Visit  [facility] and the medical providers at [facility] strive to bring you the best medical care.  In doing so we not only want to address your current medical conditions and concerns but also to detect new conditions early and prevent illness, disease and health-related problems.    Medicare offers a yearly Wellness Visit which allows our clinical staff to assess your need for preventative services including immunizations, lifestyle education, counseling to decrease risk of preventable diseases and screening for fall risk and other medical concerns.    This visit is provided free of charge (no copay) for all Medicare recipients. The clinical pharmacists at [facility] have begun to conduct these Wellness Visits which will also include a thorough review of all your medications.    As you primary medical provider recommend that you make an appointment for your Annual Wellness Visit if you have not done so already this year.  You may set up this appointment before you leave today or you may call back ([phone]) and schedule an appointment.  Please make sure when you call that you mention that you are scheduling your Annual Wellness Visit with the clinical pharmacist so that the appointment may be made for the proper length of time.     Continue current medications. Continue good therapeutic lifestyle changes which include good diet and exercise. Fall precautions discussed with patient. If an FOBT was given today- please return it to our front desk. If you are over 55 years old - you may need Prevnar 43 or the adult Pneumonia vaccine.  **Flu shots are available--- please call and schedule a FLU-CLINIC appointment**  After your visit with [place] today you will receive a survey in the mail or online from [employer] regarding your care with [place]. Please take a moment to fill this out. Your feedback is very  important to [place] as you can help [place] better understand your patient needs as well as improve your experience and satisfaction. WE CARE ABOUT YOU!!!  Use Flonase regularly 1 spray each nostril nightly  Take Mucinex, blue and white in color one twice daily fr couh and congestion with a lage glass of water Avoid environments which should call or she did cough and sneeze more Drink plenty of fluids and stay well hydrated the summer We will call with lab work results as soon as those results become available

## 2016-08-10 ENCOUNTER — Ambulatory Visit (INDEPENDENT_AMBULATORY_CARE_PROVIDER_SITE_OTHER): Payer: Medicare Other | Admitting: Specialist

## 2016-08-10 LAB — CBC WITH DIFFERENTIAL/PLATELET
BASOS ABS: 0.1 10*3/uL (ref 0.0–0.2)
BASOS: 1 %
EOS (ABSOLUTE): 0.4 10*3/uL (ref 0.0–0.4)
EOS: 4 %
HEMOGLOBIN: 12 g/dL (ref 11.1–15.9)
Hematocrit: 36.5 % (ref 34.0–46.6)
IMMATURE GRANS (ABS): 0.2 10*3/uL — AB (ref 0.0–0.1)
Immature Granulocytes: 2 %
LYMPHS: 36 %
Lymphocytes Absolute: 3.2 10*3/uL — ABNORMAL HIGH (ref 0.7–3.1)
MCH: 30.9 pg (ref 26.6–33.0)
MCHC: 32.9 g/dL (ref 31.5–35.7)
MCV: 94 fL (ref 79–97)
MONOCYTES: 4 %
Monocytes Absolute: 0.3 10*3/uL (ref 0.1–0.9)
NEUTROS ABS: 5 10*3/uL (ref 1.4–7.0)
Neutrophils: 53 %
Platelets: 143 10*3/uL — ABNORMAL LOW (ref 150–379)
RBC: 3.88 x10E6/uL (ref 3.77–5.28)
RDW: 13.2 % (ref 12.3–15.4)
WBC: 9.1 10*3/uL (ref 3.4–10.8)

## 2016-08-10 LAB — HEPATIC FUNCTION PANEL
ALBUMIN: 3.4 g/dL — AB (ref 3.5–4.7)
ALK PHOS: 83 IU/L (ref 39–117)
ALT: 8 IU/L (ref 0–32)
AST: 18 IU/L (ref 0–40)
BILIRUBIN, DIRECT: 0.1 mg/dL (ref 0.00–0.40)
Bilirubin Total: 0.2 mg/dL (ref 0.0–1.2)
TOTAL PROTEIN: 6.5 g/dL (ref 6.0–8.5)

## 2016-08-10 LAB — THYROID PANEL WITH TSH
FREE THYROXINE INDEX: 2.9 (ref 1.2–4.9)
T3 Uptake Ratio: 33 % (ref 24–39)
T4, Total: 8.8 ug/dL (ref 4.5–12.0)
TSH: 0.807 u[IU]/mL (ref 0.450–4.500)

## 2016-08-10 LAB — BMP8+EGFR
BUN/Creatinine Ratio: 10 — ABNORMAL LOW (ref 12–28)
BUN: 9 mg/dL (ref 8–27)
CO2: 26 mmol/L (ref 18–29)
CREATININE: 0.93 mg/dL (ref 0.57–1.00)
Calcium: 9.2 mg/dL (ref 8.7–10.3)
Chloride: 101 mmol/L (ref 96–106)
GFR calc Af Amer: 63 mL/min/{1.73_m2} (ref >59)
GFR calc non Af Amer: 55 mL/min/{1.73_m2} — ABNORMAL LOW (ref >59)
Glucose: 106 mg/dL — ABNORMAL HIGH (ref 65–99)
Potassium: 3.5 mmol/L (ref 3.5–5.2)
Sodium: 143 mmol/L (ref 134–144)

## 2016-08-10 LAB — LIPID PANEL
CHOLESTEROL TOTAL: 109 mg/dL (ref 100–199)
Chol/HDL Ratio: 2 ratio (ref 0.0–4.4)
HDL: 55 mg/dL (ref >39)
LDL Calculated: 40 mg/dL (ref 0–99)
TRIGLYCERIDES: 70 mg/dL (ref 0–149)
VLDL CHOLESTEROL CAL: 14 mg/dL (ref 5–40)

## 2016-08-10 LAB — VITAMIN D 25 HYDROXY (VIT D DEFICIENCY, FRACTURES): Vit D, 25-Hydroxy: 38.9 ng/mL (ref 30.0–100.0)

## 2016-08-14 ENCOUNTER — Other Ambulatory Visit: Payer: Self-pay | Admitting: Family Medicine

## 2016-08-16 DIAGNOSIS — F4323 Adjustment disorder with mixed anxiety and depressed mood: Secondary | ICD-10-CM | POA: Diagnosis not present

## 2016-08-23 ENCOUNTER — Encounter (INDEPENDENT_AMBULATORY_CARE_PROVIDER_SITE_OTHER): Payer: Self-pay | Admitting: Specialist

## 2016-08-23 ENCOUNTER — Ambulatory Visit (INDEPENDENT_AMBULATORY_CARE_PROVIDER_SITE_OTHER): Payer: Medicare Other

## 2016-08-23 ENCOUNTER — Ambulatory Visit (INDEPENDENT_AMBULATORY_CARE_PROVIDER_SITE_OTHER): Payer: Medicare Other | Admitting: Specialist

## 2016-08-23 VITALS — BP 149/65 | HR 63 | Ht 64.0 in | Wt 144.0 lb

## 2016-08-23 DIAGNOSIS — M25521 Pain in right elbow: Secondary | ICD-10-CM

## 2016-08-23 DIAGNOSIS — M4316 Spondylolisthesis, lumbar region: Secondary | ICD-10-CM | POA: Diagnosis not present

## 2016-08-23 DIAGNOSIS — S2231XA Fracture of one rib, right side, initial encounter for closed fracture: Secondary | ICD-10-CM

## 2016-08-23 DIAGNOSIS — R0781 Pleurodynia: Secondary | ICD-10-CM

## 2016-08-23 MED ORDER — HYDROCODONE-ACETAMINOPHEN 5-325 MG PO TABS: ORAL_TABLET | ORAL | 0 refills | Status: DC

## 2016-08-23 NOTE — Progress Notes (Signed)
Office Visit Note   Patient: [name]           Date of Birth: [birth year]           MRN: [id number] Visit Date: 08/23/2016              Requested by: Chipper Herb, MD [address] [place], [place] [address] PCP: Chipper Herb, MD   Assessment & Plan: Visit Diagnoses:  1. Spondylolisthesis, lumbar region   2. Pain in right elbow   3. Rib pain on right side   4. Closed fracture of one rib of right side, initial encounter     Plan: X-rays reviewed with patient today. Was going to give a prescription for tramadol for pain but this is contraindicated with her taking Cymbalta. She has taken Norco in the past. Prescription given for Norco 5/325 one half tablet by mouth every 12 hours when necessary for pain #30 tablets no refills. With her recent fall I advised patient that I'm somewhat hesitant about giving this but she states that she will try to be careful. She is encouraged to try to take the breast when she can to prevent atelectasis/pneumonia. Says that she has already been doing this.  Right elbow contusion should gradually improve. Follow-up in 2 weeks for recheck but I also advised patient to call me before that appointment to let me know how she is feeling. If she begins to experience any worsening pain in chest or has any shortness of breath she should go immediately to the emergency room. Voices understanding. All questions answered.  Follow-Up Instructions: Return in about 2 weeks (around 09/06/2016).   Orders:  Orders Placed This Encounter  Procedures  . XR Lumbar Spine 2-3 Views  . XR Ribs Unilateral Right  . XR Elbow 2 Views Right   Meds ordered this encounter  Medications  . HYDROcodone-acetaminophen (NORCO/VICODIN) 5-325 MG tablet    Sig: Take 1/2 tablet by mouth every 12 hours when necessary for pain.    Dispense:  30 tablet    Refill:  0      Procedures: No procedures performed   Clinical Data: No additional findings.   Subjective: Chief  Complaint  Patient presents with  . Lower Back - Follow-up    HPI Patient returns for follow-up for low back pain. She has known history of multilevel lumbar spondylosis with L3-4 spondylolisthesis. States that her back has been doing about the same. She did have a fall last Friday when she went into her bedroom turned and fell hitting the bed rail on her right side. Currently complaining of right anterior rib pain. Denies dyspnea but does have pain with taking deep breaths and also with movement. Also complaining of some bruising and soreness right elbow which is also from the fall. Patient states that she is not quite sure how she fell. No loss of consciousness. No head injury. Review of Systems  Constitutional: Negative.   HENT: Negative.   Respiratory: Negative for cough and shortness of breath.   Cardiovascular: Negative.   Gastrointestinal: Negative.   Musculoskeletal: Positive for back pain.       Right elbow pain  Psychiatric/Behavioral: Negative.      Objective: Vital Signs: BP (!) 149/65 (BP Location: Left Arm, Patient Position: Sitting)   Pulse 63   Ht 5\' 4"  (1.626 m)   Wt 144 lb (65.3 kg)   BMI 24.72 kg/m   Physical Exam  Constitutional: She is oriented to person,  place, and time. No distress.  HENT:  Head: Normocephalic and atraumatic.  Eyes: EOM are normal. Pupils are equal, round, and reactive to light.  Neck: Normal range of motion.  Pulmonary/Chest: No respiratory distress. She exhibits tenderness (Right lower anterior ribs).  Musculoskeletal:  Right lower anterior rib pain. No bruising. Bilateral shoulders unremarkable. Right elbow she has good range of motion. Does have some bruising around the posterior elbow. She is moderate moderately tender over the olecranon. Radial head nontender. Neurovascularly intact.  Neurological: She is alert and oriented to person, place, and time.  Skin: Skin is warm and dry.  Psychiatric: She has a normal mood and affect.     Ortho Exam  Specialty Comments:  No specialty comments available.  Imaging: Xr Ribs Unilateral Right  Result Date: 08/23/2016 Rib Films show question of a possible nondisplaced lower rib fracture or patient is having her pain.  Xr Elbow 2 Views Right  Result Date: 08/23/2016 X-ray right elbow does not show any obvious signs of fracture.  Xr Lumbar Spine 2-3 Views  Result Date: 08/23/2016 X-rays lumbar spine again show multilevel lumbar spondylosis with multilevel disc space collapse. L3-4 spondylolisthesis. X-rays are stable when compared to previous films December 2017. No acute finding.    PMFS History: Patient Active Problem List   Diagnosis Date Noted  . Lumbar radiculopathy 03/14/2016  . HNP (herniated nucleus pulposus), cervical 05/16/2015    Class: Chronic  . Herniated nucleus pulposus, lumbar 05/16/2015  . Abnormal EKG 04/21/2015  . Thrombocytopenia ([place]) 08/18/2014  . Insomnia 11/24/2013  . Impingement syndrome of left shoulder 09/18/2013  . S/P arthroscopy of shoulder 09/18/2013  . Low back pain 03/17/2013  . Neuropathy 03/17/2013  . Metabolic syndrome 16/03/930  . Overweight (BMI 25.0-29.9) 03/17/2013  . Vitamin D deficiency 12/01/2012  . Chronic insomnia 12/01/2012  . Fibrocystic breast changes 05/21/2011  . Essential hypertension   . Hyperlipidemia   . Peptic ulcer disease   . ITP (idiopathic thrombocytopenic purpura)   . Rhinitis   . Elevated blood sugar   . Hypothyroidism   . Goiter   . Colon polyps   . Adhesion of abdominal wall   . Esophageal stricture    Past Medical History:  Diagnosis Date  . Adhesion of abdominal wall    "continues to have abdominal pain pain intermittent right abdominal side. "scar tissue"  . Arthritis   . Breast cyst   . Bronchitis, asthmatic    'bronchitis that will go into an asthma like attack but haven't had it in years'  . Colon polyps   . Elevated blood sugar   . Esophageal stricture    "occ. problems  with swallowing water"  . Full dentures   . GI bleed   . Goiter   . History of blood transfusion   . History of hiatal hernia    repaired over 20 years ago  . ITP (idiopathic thrombocytopenic purpura)    not a problem now.  . Other and unspecified hyperlipidemia   . Peptic ulcer disease   . Rhinitis   . Unspecified essential hypertension   . Unspecified hypothyroidism     Family History  Problem Relation Age of Onset  . Lung cancer Father   . Heart disease Mother        Unkown.  Died age 45  . Emphysema Sister     Past Surgical History:  Procedure Laterality Date  . ABDOMINAL HYSTERECTOMY    . APPENDECTOMY    . BACK SURGERY  x1 Lumbar, x2 cervical anterior and posterior .  Marland Kitchen BREAST SURGERY     x3 biopsy  . cataract surgery Bilateral   . CHOLECYSTECTOMY    . EYE SURGERY Bilateral 2007   cataracts   . HEMORRHOID SURGERY    . HERNIA REPAIR    . JOINT REPLACEMENT    . LUMBAR LAMINECTOMY N/A 05/16/2015   Procedure: MICRODISCECTOMY Lumbar 3-Lumbar 4;  Surgeon: Jessy Oto, MD;  Location: [facility];  Service: Orthopedics;  Laterality: N/A;  . 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 THYROID SURGERY     Total "goiter removal"  . TOTAL KNEE ARTHROPLASTY Right 2010   Social History   Occupational History  . Not on file.   Social History Main Topics  . Smoking status: Passive Smoke Exposure - Never Smoker    Start date: 03/12/1945    Last attempt to quit: 03/12/1982  . Smokeless tobacco: Never Used  . Alcohol use No  . Drug use: No  . Sexual activity: Not Currently

## 2016-08-30 ENCOUNTER — Other Ambulatory Visit: Payer: Self-pay | Admitting: Family Medicine

## 2016-09-06 ENCOUNTER — Ambulatory Visit (INDEPENDENT_AMBULATORY_CARE_PROVIDER_SITE_OTHER): Payer: Medicare Other

## 2016-09-06 ENCOUNTER — Ambulatory Visit
Admission: RE | Admit: 2016-09-06 | Discharge: 2016-09-06 | Disposition: A | Discharge status: Home / Self Care | Payer: Medicare Other | Source: Ambulatory Visit | Attending: Specialist | Admitting: Specialist

## 2016-09-06 ENCOUNTER — Encounter (INDEPENDENT_AMBULATORY_CARE_PROVIDER_SITE_OTHER): Payer: Self-pay | Admitting: Specialist

## 2016-09-06 ENCOUNTER — Ambulatory Visit (INDEPENDENT_AMBULATORY_CARE_PROVIDER_SITE_OTHER): Payer: Medicare Other | Admitting: Specialist

## 2016-09-06 VITALS — BP 171/72 | HR 63 | Ht 65.0 in | Wt 144.0 lb

## 2016-09-06 DIAGNOSIS — S32591A Other specified fracture of right pubis, initial encounter for closed fracture: Secondary | ICD-10-CM | POA: Diagnosis not present

## 2016-09-06 DIAGNOSIS — M25551 Pain in right hip: Secondary | ICD-10-CM

## 2016-09-06 MED ORDER — HYDROCODONE-ACETAMINOPHEN 5-325 MG PO TABS: 1.0000 | ORAL_TABLET | ORAL | 0 refills | Status: DC | PRN

## 2016-09-06 NOTE — Progress Notes (Signed)
Office Visit Note   Patient: [name]           Date of Birth: [birth year]           MRN: [id number] Visit Date: 09/06/2016              Requested by: Chipper Herb, MD [address] [place], [place] [address] PCP: Chipper Herb, MD   Assessment & Plan: Visit Diagnoses:  1. Pain of right hip joint     Plan:Use a walker place as little weight on the right leg as possible until the results of an MRI are available to assess for  A bone injury of the right hip.  Ice the right hip 15-20 minutes at a time and off for 15-20 minutes as needed. Rx for discomfort prescribed.   Follow-Up Instructions: Return in about 1 week (around 09/13/2016).   Orders:  Orders Placed This Encounter  Procedures  . XR HIP UNILAT W OR W/O PELVIS 2-3 VIEWS RIGHT   No orders of the defined types were placed in this encounter.     Procedures: No procedures performed   Clinical Data: No additional findings.   Subjective: Chief Complaint  Patient presents with  . Right Hip - Pain    83 year old female with history of osteoporosis and vit D deficiency, fell three weeks ago landing on the right side and injuring her right elbow, right ribs and right hip. Initially hip not as painful as the right ribs. Clinically right rib contusion vs nondisplace rib fracture. No over the past week the right hip pain is becoming more a problem. Pain with standing and walking and with any movement of the right hip. She is having difficulty lifting the right thigh at the hip.    Review of Systems  Constitutional: Negative.   HENT: Positive for rhinorrhea.   Eyes: Negative.   Respiratory: Positive for wheezing.   Cardiovascular: Negative.   Gastrointestinal: Negative.   Endocrine: Negative.   Genitourinary: Negative.   Musculoskeletal: Positive for arthralgias and back pain.  Skin: Negative.   Allergic/Immunologic: Negative.   Neurological: Negative.   Hematological: Negative.     Psychiatric/Behavioral: Negative.      Objective: Vital Signs: BP (!) 171/72 (BP Location: Left Arm, Patient Position: Sitting)   Pulse 63   Ht 5\' 5"  (1.651 m)   Wt 144 lb (65.3 kg)   BMI 23.96 kg/m   Physical Exam  Constitutional: She is oriented to person, place, and time. She appears well-developed and well-nourished.  HENT:  Head: Normocephalic and atraumatic.  Eyes: EOM are normal. Pupils are equal, round, and reactive to light.  Neck: Normal range of motion. Neck supple.  Pulmonary/Chest: Effort normal and breath sounds normal.  Abdominal: Soft. Bowel sounds are normal.  Neurological: She is alert and oriented to person, place, and time.  Skin: Skin is warm and dry.  Psychiatric: She has a normal mood and affect. Her behavior is normal. Judgment and thought content normal.    Right Hip Exam   Tenderness  The patient is experiencing tenderness in the greater trochanter and lateral.  Range of Motion  Extension: abnormal  Flexion: abnormal  Internal Rotation:  15 abnormal  External Rotation:  20 abnormal  Abduction: abnormal  Adduction: abnormal   Muscle Strength  Abduction: 4/5  Adduction: 4/5  Flexion: 4/5   Tests  FABER: positive Ober: positive  Other  Erythema: absent Scars: absent Sensation: normal Pulse: present  Comments:  Pain right hip with IR and ER, tender over the right greater trochanter.       Specialty Comments:  No specialty comments available.  Imaging: Xr Hip Unilat W Or W/o Pelvis 2-3 Views Right  Result Date: 09/06/2016 AP and lateral pelvis and right hip, show mild degenerative joint disease right hip, no deformity, but some small areas of lucency associated with the right lesser trochanter, this may be a nondisplace intertrochanteric stress fracture, MRI is recommended.    PMFS History: Patient Active Problem List   Diagnosis Date Noted  . HNP (herniated nucleus pulposus), cervical 05/16/2015    Priority: High     Class: Chronic  . Lumbar radiculopathy 03/14/2016  . Herniated nucleus pulposus, lumbar 05/16/2015  . Abnormal EKG 04/21/2015  . Thrombocytopenia ([facility]) 08/18/2014  . Insomnia 11/24/2013  . Impingement syndrome of left shoulder 09/18/2013  . S/P arthroscopy of shoulder 09/18/2013  . Low back pain 03/17/2013  . Neuropathy 03/17/2013  . Metabolic syndrome 56/21/3086  . Overweight (BMI 25.0-29.9) 03/17/2013  . Vitamin D deficiency 12/01/2012  . Chronic insomnia 12/01/2012  . Fibrocystic breast changes 05/21/2011  . Essential hypertension   . Hyperlipidemia   . Peptic ulcer disease   . ITP (idiopathic thrombocytopenic purpura)   . Rhinitis   . Elevated blood sugar   . Hypothyroidism   . Goiter   . Colon polyps   . Adhesion of abdominal wall   . Esophageal stricture    Past Medical History:  Diagnosis Date  . Adhesion of abdominal wall    "continues to have abdominal pain pain intermittent right abdominal side. "scar tissue"  . Arthritis   . Breast cyst   . Bronchitis, asthmatic    'bronchitis that will go into an asthma like attack but haven't had it in years'  . Colon polyps   . Elevated blood sugar   . Esophageal stricture    "occ. problems with swallowing water"  . Full dentures   . GI bleed   . Goiter   . History of blood transfusion   . History of hiatal hernia    repaired over 20 years ago  . ITP (idiopathic thrombocytopenic purpura)    not a problem now.  . Other and unspecified hyperlipidemia   . Peptic ulcer disease   . Rhinitis   . Unspecified essential hypertension   . Unspecified hypothyroidism     Family History  Problem Relation Age of Onset  . Lung cancer Father   . Heart disease Mother        Unkown.  Died age 44  . Emphysema Sister     Past Surgical History:  Procedure Laterality Date  . ABDOMINAL HYSTERECTOMY    . APPENDECTOMY    . BACK SURGERY     x1 Lumbar, x2 cervical anterior and posterior .  Marland Kitchen BREAST SURGERY     x3 biopsy  .  cataract surgery Bilateral   . CHOLECYSTECTOMY    . EYE SURGERY Bilateral 2007   cataracts   . HEMORRHOID SURGERY    . HERNIA REPAIR    . JOINT REPLACEMENT    . LUMBAR LAMINECTOMY N/A 05/16/2015   Procedure: MICRODISCECTOMY Lumbar 3-Lumbar 4;  Surgeon: Jessy Oto, MD;  Location: [facility];  Service: Orthopedics;  Laterality: N/A;  . 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 THYROID SURGERY     Total "goiter removal"  . TOTAL KNEE ARTHROPLASTY Right 2010   Social History  Occupational History  . Not on file.   Social History Main Topics  . Smoking status: Passive Smoke Exposure - Never Smoker    Start date: 03/12/1945    Last attempt to quit: 03/12/1982  . Smokeless tobacco: Never Used  . Alcohol use No  . Drug use: No  . Sexual activity: Not Currently

## 2016-09-06 NOTE — Patient Instructions (Signed)
Use a walker place as little weight on the right leg as possible until the results of an MRI are available to assess for  A bone injury of the right hip.  Ice the right hip 15-20 minutes at a time and off for 15-20 minutes as needed. Rx for discomfort prescribed.

## 2016-09-07 ENCOUNTER — Other Ambulatory Visit (INDEPENDENT_AMBULATORY_CARE_PROVIDER_SITE_OTHER): Payer: Self-pay | Admitting: Specialist

## 2016-09-07 DIAGNOSIS — S32591A Other specified fracture of right pubis, initial encounter for closed fracture: Secondary | ICD-10-CM

## 2016-09-07 MED ORDER — CALCITONIN (SALMON) 200 UNIT/ACT NA SOLN: 1.0000 | Freq: Every day | NASAL | Status: DC

## 2016-09-14 ENCOUNTER — Ambulatory Visit (INDEPENDENT_AMBULATORY_CARE_PROVIDER_SITE_OTHER): Payer: Medicare Other | Admitting: Specialist

## 2016-10-08 ENCOUNTER — Other Ambulatory Visit: Payer: Self-pay | Admitting: Family Medicine

## 2016-10-09 ENCOUNTER — Other Ambulatory Visit (INDEPENDENT_AMBULATORY_CARE_PROVIDER_SITE_OTHER): Payer: Self-pay | Admitting: Specialist

## 2016-10-09 MED ORDER — HYDROCODONE-ACETAMINOPHEN 5-325 MG PO TABS: 1.0000 | ORAL_TABLET | ORAL | 0 refills | Status: DC | PRN

## 2016-10-23 ENCOUNTER — Telehealth: Payer: Self-pay | Admitting: Radiology

## 2016-10-23 NOTE — Telephone Encounter (Signed)
Patient is in a lot of pain, she has appointment the end of the month, but wants to know if she can get in any sooner. She had the MRI scan, but is not yeat aware of the results She had to cancel her appointment in July due to illness. Can I tell her the MRI report and let her know if we can get her in sooner?   Right-sided nondisplaced superior and inferior pubic rami fractures with associated right adductor muscle injuries/strains/partial tears. 2. No hip fracture or AVN.

## 2016-10-25 NOTE — Telephone Encounter (Signed)
If someonce cancels on Dr. Otho Ket sched can you call her and sched her, PLEASE!! Thank you

## 2016-10-30 ENCOUNTER — Other Ambulatory Visit (INDEPENDENT_AMBULATORY_CARE_PROVIDER_SITE_OTHER): Payer: Self-pay | Admitting: Radiology

## 2016-10-30 NOTE — Telephone Encounter (Signed)
Pharmacy sent a request for a refill

## 2016-11-07 ENCOUNTER — Other Ambulatory Visit: Payer: Self-pay | Admitting: Family Medicine

## 2016-11-07 ENCOUNTER — Ambulatory Visit (INDEPENDENT_AMBULATORY_CARE_PROVIDER_SITE_OTHER): Payer: Medicare Other | Admitting: Specialist

## 2016-11-07 ENCOUNTER — Encounter (INDEPENDENT_AMBULATORY_CARE_PROVIDER_SITE_OTHER): Payer: Self-pay | Admitting: Specialist

## 2016-11-07 VITALS — BP 170/69 | HR 53 | Ht 65.0 in | Wt 144.0 lb

## 2016-11-07 DIAGNOSIS — M25551 Pain in right hip: Secondary | ICD-10-CM | POA: Diagnosis not present

## 2016-11-07 DIAGNOSIS — M4807 Spinal stenosis, lumbosacral region: Secondary | ICD-10-CM

## 2016-11-07 DIAGNOSIS — M48062 Spinal stenosis, lumbar region with neurogenic claudication: Secondary | ICD-10-CM

## 2016-11-07 DIAGNOSIS — S329XXS Fracture of unspecified parts of lumbosacral spine and pelvis, sequela: Secondary | ICD-10-CM | POA: Diagnosis not present

## 2016-11-07 DIAGNOSIS — M4316 Spondylolisthesis, lumbar region: Secondary | ICD-10-CM

## 2016-11-07 MED ORDER — HYDROCODONE-ACETAMINOPHEN 5-325 MG PO TABS: 1.0000 | ORAL_TABLET | Freq: Four times a day (QID) | ORAL | 0 refills | Status: DC | PRN

## 2016-11-07 MED ORDER — GABAPENTIN 100 MG PO CAPS: 100.0000 mg | ORAL_CAPSULE | Freq: Every day | ORAL | 1 refills | Status: DC

## 2016-11-07 NOTE — Patient Instructions (Signed)
Avoid bending, stooping and avoid lifting weights greater than 10 lbs. Avoid prolong standing and walking. Avoid frequent bending and stooping  No lifting greater than 10 lbs. May use ice or moist heat for pain. Weight loss is of benefit. Handicap license is approved.The main ways of treat osteoarthritis, that are found to be success. Weight loss helps to decrease pain. Exercise is important to maintaining cartilage and thickness and strengthening. Ice is okay  In afternoon and evening and hot shower in the am

## 2016-11-07 NOTE — Progress Notes (Signed)
Office Visit Note   Patient: [name]           Date of Birth: 12/26/1925           MRN: [id number] Visit Date: 11/07/2016              Requested by: Chipper Herb, MD [address] [place], [place] [address] PCP: Chipper Herb, MD   Assessment & Plan: Visit Diagnoses:  1. Pain in right hip   2. Closed fracture of right pelvis, sequela   3. Spinal stenosis of lumbar region with neurogenic claudication   4. Spondylolisthesis, lumbar region     Plan: Avoid bending, stooping and avoid lifting weights greater than 10 lbs. Avoid prolong standing and walking. Avoid frequent bending and stooping  No lifting greater than 10 lbs. May use ice or moist heat for pain. Weight loss is of benefit. Handicap license is approved.The main ways of treat osteoarthritis, that are found to be success. Weight loss helps to decrease pain. Exercise is important to maintaining cartilage and thickness and strengthening. Ice is okay  In afternoon and evening and hot shower in the am Follow-Up Instructions: Return in about 3 weeks (around 11/28/2016).   Orders:  No orders of the defined types were placed in this encounter.  No orders of the defined types were placed in this encounter.     Procedures: No procedures performed   Clinical Data: No additional findings.   Subjective: Chief Complaint  Patient presents with  . Right Hip - Follow-up, Pain    HPI  Review of Systems  Constitutional: Negative for activity change, appetite change, chills, diaphoresis, fatigue, fever and unexpected weight change.  HENT: Positive for postnasal drip, rhinorrhea, sinus pain and sinus pressure.   Eyes: Positive for visual disturbance. Negative for photophobia, pain, discharge, redness and itching.  Respiratory: Negative for apnea, cough, choking, chest tightness, shortness of breath, wheezing and stridor.   Cardiovascular: Positive for chest pain. Negative for palpitations and leg swelling.    Gastrointestinal: Negative for abdominal distention, abdominal pain, anal bleeding, blood in stool, constipation, diarrhea, nausea, rectal pain and vomiting.  Endocrine: Positive for polyuria. Negative for heat intolerance.  Genitourinary: Positive for difficulty urinating, dyspareunia and dysuria. Negative for enuresis and flank pain.  Musculoskeletal: Positive for arthralgias, back pain and gait problem.  Skin: Negative.  Negative for color change, pallor, rash and wound.  Allergic/Immunologic: Negative.   Neurological: Positive for weakness and numbness. Negative for tremors, seizures, syncope, speech difficulty and headaches.  Hematological: Negative.  Negative for adenopathy. Does not bruise/bleed easily.  Psychiatric/Behavioral: Negative.  Negative for agitation, behavioral problems, confusion, decreased concentration, dysphoric mood, hallucinations, self-injury, sleep disturbance and suicidal ideas. The patient is not nervous/anxious and is not hyperactive.      Objective: Vital Signs: BP (!) 170/69 (BP Location: Left Arm, Patient Position: Sitting)   Pulse (!) 53   Ht 5\' 5"  (1.651 m)   Wt 144 lb (65.3 kg)   BMI 23.96 kg/m   Physical Exam  Constitutional: She is oriented to person, place, and time. She appears well-developed and well-nourished.  HENT:  Head: Normocephalic and atraumatic.  Eyes: Pupils are equal, round, and reactive to light. EOM are normal.  Neck: Normal range of motion. Neck supple.  Pulmonary/Chest: Effort normal and breath sounds normal.  Abdominal: Soft. Bowel sounds are normal.  Neurological: She is alert and oriented to person, place, and time.  Skin: Skin is warm and dry.  Psychiatric:  She has a normal mood and affect. Her behavior is normal. Judgment and thought content normal.    Right Hip Exam   Tenderness  The patient is experiencing tenderness in the greater trochanter.  Range of Motion  Extension: abnormal  Flexion: abnormal  Internal  Rotation: abnormal  External Rotation: abnormal  Abduction: abnormal  Adduction: abnormal   Muscle Strength  Abduction: 4/5  Adduction: 4/5  Flexion: 4/5   Tests  FABER: positive Ober: positive  Other  Erythema: absent Scars: present Sensation: none Pulse: present      Specialty Comments:  No specialty comments available.  Imaging: No results found.   PMFS History: Patient Active Problem List   Diagnosis Date Noted  . HNP (herniated nucleus pulposus), cervical 05/16/2015    Priority: High    Class: Chronic  . Lumbar radiculopathy 03/14/2016  . Herniated nucleus pulposus, lumbar 05/16/2015  . Abnormal EKG 04/21/2015  . Thrombocytopenia ([employer]) 08/18/2014  . Insomnia 11/24/2013  . Impingement syndrome of left shoulder 09/18/2013  . S/P arthroscopy of shoulder 09/18/2013  . Low back pain 03/17/2013  . Neuropathy 03/17/2013  . Metabolic syndrome 16/12/9602  . Overweight (BMI 25.0-29.9) 03/17/2013  . Vitamin D deficiency 12/01/2012  . Chronic insomnia 12/01/2012  . Fibrocystic breast changes 05/21/2011  . Essential hypertension   . Hyperlipidemia   . Peptic ulcer disease   . ITP (idiopathic thrombocytopenic purpura)   . Rhinitis   . Elevated blood sugar   . Hypothyroidism   . Goiter   . Colon polyps   . Adhesion of abdominal wall   . Esophageal stricture    Past Medical History:  Diagnosis Date  . Adhesion of abdominal wall    "continues to have abdominal pain pain intermittent right abdominal side. "scar tissue"  . Arthritis   . Breast cyst   . Bronchitis, asthmatic    'bronchitis that will go into an asthma like attack but haven't had it in years'  . Colon polyps   . Elevated blood sugar   . Esophageal stricture    "occ. problems with swallowing water"  . Full dentures   . GI bleed   . Goiter   . History of blood transfusion   . History of hiatal hernia    repaired over 20 years ago  . ITP (idiopathic thrombocytopenic purpura)    not a  problem now.  . Other and unspecified hyperlipidemia   . Peptic ulcer disease   . Rhinitis   . Unspecified essential hypertension   . Unspecified hypothyroidism     Family History  Problem Relation Age of Onset  . Lung cancer Father   . Heart disease Mother        Unkown.  Died age 66  . Emphysema Sister     Past Surgical History:  Procedure Laterality Date  . ABDOMINAL HYSTERECTOMY    . APPENDECTOMY    . BACK SURGERY     x1 Lumbar, x2 cervical anterior and posterior .  Marland Kitchen BREAST SURGERY     x3 biopsy  . cataract surgery Bilateral   . CHOLECYSTECTOMY    . EYE SURGERY Bilateral 2007   cataracts   . HEMORRHOID SURGERY    . HERNIA REPAIR    . JOINT REPLACEMENT    . LUMBAR LAMINECTOMY N/A 05/16/2015   Procedure: MICRODISCECTOMY Lumbar 3-Lumbar 4;  Surgeon: Jessy Oto, MD;  Location: [facility];  Service: Orthopedics;  Laterality: N/A;  . SPLENECTOMY, TOTAL     '71 'due  to platelet disorder"  . THYROID SURGERY     Total "goiter removal"  . TOTAL KNEE ARTHROPLASTY Right 2010   Social History   Occupational History  . Not on file.   Social History Main Topics  . Smoking status: Passive Smoke Exposure - Never Smoker    Start date: 03/12/1945    Last attempt to quit: 03/12/1982  . Smokeless tobacco: Never Used  . Alcohol use No  . Drug use: No  . Sexual activity: Not Currently

## 2016-11-08 MED ORDER — GABAPENTIN 100 MG PO CAPS: 100.0000 mg | ORAL_CAPSULE | Freq: Every day | ORAL | 1 refills | Status: DC

## 2016-11-08 NOTE — Addendum Note (Signed)
Addended by: Minda Ditto, Alyse Low N on: 11/08/2016 11:49 AM   Modules accepted: Orders

## 2016-11-23 ENCOUNTER — Ambulatory Visit (HOSPITAL_COMMUNITY): Payer: Medicare Other

## 2016-11-27 ENCOUNTER — Ambulatory Visit (HOSPITAL_COMMUNITY)
Admission: RE | Admit: 2016-11-27 | Discharge: 2016-11-27 | Disposition: A | Discharge status: Home / Self Care | Payer: Medicare Other | Source: Ambulatory Visit | Attending: Specialist | Admitting: Specialist

## 2016-11-27 DIAGNOSIS — S329XXS Fracture of unspecified parts of lumbosacral spine and pelvis, sequela: Secondary | ICD-10-CM

## 2016-11-27 DIAGNOSIS — M25551 Pain in right hip: Secondary | ICD-10-CM | POA: Insufficient documentation

## 2016-11-27 DIAGNOSIS — X58XXXS Exposure to other specified factors, sequela: Secondary | ICD-10-CM | POA: Diagnosis not present

## 2016-11-27 DIAGNOSIS — M48061 Spinal stenosis, lumbar region without neurogenic claudication: Secondary | ICD-10-CM | POA: Diagnosis not present

## 2016-11-27 DIAGNOSIS — M48062 Spinal stenosis, lumbar region with neurogenic claudication: Secondary | ICD-10-CM

## 2016-11-27 DIAGNOSIS — M4807 Spinal stenosis, lumbosacral region: Secondary | ICD-10-CM

## 2016-11-27 DIAGNOSIS — S32511A Fracture of superior rim of right pubis, initial encounter for closed fracture: Secondary | ICD-10-CM | POA: Diagnosis not present

## 2016-11-27 DIAGNOSIS — M4316 Spondylolisthesis, lumbar region: Secondary | ICD-10-CM

## 2016-11-27 LAB — POCT I-STAT CREATININE: CREATININE: 0.9 mg/dL (ref 0.44–1.00)

## 2016-11-27 MED ORDER — GADOBENATE DIMEGLUMINE 529 MG/ML IV SOLN
14.0000 mL | Freq: Once | INTRAVENOUS | Status: AC | PRN
  Administered 2016-11-27: 14 mL via INTRAVENOUS

## 2016-11-28 ENCOUNTER — Ambulatory Visit (INDEPENDENT_AMBULATORY_CARE_PROVIDER_SITE_OTHER): Payer: Medicare Other | Admitting: Specialist

## 2016-11-28 ENCOUNTER — Encounter (INDEPENDENT_AMBULATORY_CARE_PROVIDER_SITE_OTHER): Payer: Self-pay | Admitting: Specialist

## 2016-11-28 VITALS — BP 166/72 | HR 53 | Ht 65.0 in | Wt 144.0 lb

## 2016-11-28 DIAGNOSIS — M5441 Lumbago with sciatica, right side: Secondary | ICD-10-CM

## 2016-11-28 DIAGNOSIS — M8448XG Pathological fracture, other site, subsequent encounter for fracture with delayed healing: Secondary | ICD-10-CM

## 2016-11-28 DIAGNOSIS — S329XXS Fracture of unspecified parts of lumbosacral spine and pelvis, sequela: Secondary | ICD-10-CM

## 2016-11-28 DIAGNOSIS — M48062 Spinal stenosis, lumbar region with neurogenic claudication: Secondary | ICD-10-CM | POA: Diagnosis not present

## 2016-11-28 MED ORDER — HYDROCODONE-ACETAMINOPHEN 5-325 MG PO TABS: 1.0000 | ORAL_TABLET | Freq: Four times a day (QID) | ORAL | 0 refills | Status: DC | PRN

## 2016-11-28 NOTE — Progress Notes (Signed)
Office Visit Note   Patient: [name]           Date of Birth: 1925-11-17           MRN: [id number] Visit Date: 11/28/2016              Requested by: Chipper Herb, MD [address] [place], [place] [address] PCP: Chipper Herb, MD   Assessment & Plan: Visit Diagnoses:  1. Spinal stenosis of lumbar region with neurogenic claudication   2. Sacral insufficiency fracture with delayed healing   3. Acute right-sided low back pain with right-sided sciatica   4. Closed fracture of right pelvis, sequela     Plan: Avoid bending, stooping and avoid lifting weights greater than 10 lbs. Avoid prolong standing and walking. Avoid frequent bending and stooping  No lifting greater than 10 lbs. May use ice or moist heat for pain. Weight loss is of benefit. Handicap license is approved. Dr. Arlean Hopping secretary/Assistant will call to arrange for a right sacroplasty(cement injection into the area of the chronic right sacral fracture) with a right sacrum bone biopsy. Weight bearing as tolerated right leg with a walker .  Follow-Up Instructions: Return in about 3 weeks (around 12/19/2016).   Orders:  No orders of the defined types were placed in this encounter.  No orders of the defined types were placed in this encounter.     Procedures: No procedures performed   Clinical Data: No additional findings.   Subjective: Chief Complaint  Patient presents with  . Right Hip - Follow-up    MRI Review  . Lower Back - Follow-up    MRI Review    HPI  Review of Systems   Objective: Vital Signs: BP (!) 166/72 (BP Location: Left Arm, Patient Position: Sitting)   Pulse (!) 53   Ht 5\' 5"  (1.651 m)   Wt 144 lb (65.3 kg)   BMI 23.96 kg/m   Physical Exam  Ortho Exam  Specialty Comments:  No specialty comments available.  Imaging: Ct Pelvis Wo Contrast  Result Date: 11/28/2016 CLINICAL DATA:  Hip fracture. EXAM: CT PELVIS WITHOUT CONTRAST TECHNIQUE: Multidetector  CT imaging of the pelvis was performed following the standard protocol without intravenous contrast. COMPARISON:  MRI 09/06/2016 FINDINGS: Urinary Tract:  Bladder decompressed. Bowel: Diverticular changes noted diffusely in the visualized colon without diverticulitis. Vascular/Lymphatic: No iliac artery aneurysm. No pelvic sidewall lymphadenopathy. Reproductive:  Uterus surgically absent.  There is no adnexal mass. Other:  No intraperitoneal free fluid. Musculoskeletal: Bones are diffusely demineralized. Surgical changes noted in the right posterior elements of L5. Nonacute fracture identified in the right pubic bone extending into the anterior aspect of the inferior pubic ramus is similar to prior MRI. No evidence for bridging bony callus. IMPRESSION: Right pubic bone fracture involving the anterior aspect of the superior and inferior pubic rami. Fracture line remains visible with no bridging bony callus evident on today's study. Electronically Signed   By: Misty Stanley M.D.   On: 11/28/2016 08:57   Mr Lumbar Spine W Wo Contrast  Result Date: 11/28/2016 CLINICAL DATA:  Back and RIGHT hip pain for the past couple of years. Prior history of back surgery. History of fall June 2018. EXAM: MRI LUMBAR SPINE WITHOUT AND WITH CONTRAST TECHNIQUE: Multiplanar and multiecho pulse sequences of the lumbar spine were obtained without and with intravenous contrast. CONTRAST:  15mL MULTIHANCE GADOBENATE DIMEGLUMINE 529 MG/ML IV SOLN COMPARISON:  CT pelvis reported separately. MRI lumbar spine 09/20/2015.  MRI of the pelvis and RIGHT hip 21-Sep-2016. FINDINGS: Segmentation:  Standard Alignment:  6 mm anterolisthesis L3-4. Vertebrae: Incidental hemangioma L1. T2 and STIR hyperintensity, postcontrast enhancement, throughout the RIGHT sacral ala, demonstrating postcontrast enhancement, consistent with healing insufficiency fracture. This extends into the RIGHT-side of S2. Healing of minor endplate edema at L3 and L4 noted  previously. Conus medullaris: Extends to the L1 level and appears normal. Paraspinal and other soft tissues: Unremarkable. Disc levels: L1-L2:  Annular bulge.  Facet arthropathy.  No impingement. L2-L3:  Annular bulge.  Facet arthropathy.  No impingement. L3-L4: Prior RIGHT-sided laminectomy. 6 mm anterolisthesis. Posterior element hypertrophy. Central and rightward extrusion has slightly decreased in size compared with priors. There is a large disc component which remains a free fragment, with cephalad migration. There is moderate to severe stenosis. RIGHT greater than LEFT L4 and L3 nerve root impingement are observed. Enhancing soft tissue at the laminectomy site consistent with granulation tissue. No findings concerning for infection. L4-L5: Disc space narrowing. RIGHT laminotomy. Posterior element hypertrophy. No stenosis but subarticular zone narrowing on the LEFT could affect the L5 nerve root. L5-S1: Mild disc space narrowing. RIGHT laminotomy. Central protrusion. Prominent epidural fat. Posterior element hypertrophy. BILATERAL foraminal narrowing, not clearly compressive. IMPRESSION: Edema and enhancement throughout the RIGHT sacral ala consistent with a posttraumatic insufficiency fracture. In retrospect, RIGHT sacral alar injury can be seen on prior MR from September 21, 2016, but is much more pronounced on today's exam, consistent with interval healing. Moderate to severe stenosis at L3-4 is redemonstrated, similar to priors. This relates to 6 mm of slip, posterior element hypertrophy, and a large disc extrusion with cephalad migrated free fragment. RIGHT greater than LEFT L3 and L4 nerve root impingement. See discussion above. Electronically Signed   By: Staci Righter M.D.   On: 11/28/2016 08:07     PMFS History: Patient Active Problem List   Diagnosis Date Noted  . HNP (herniated nucleus pulposus), cervical 05/16/2015    Priority: High    Class: Chronic  . Lumbar radiculopathy 03/14/2016  .  Herniated nucleus pulposus, lumbar 05/16/2015  . Abnormal EKG 04/21/2015  . Thrombocytopenia ([place]) 08/18/2014  . Insomnia 11/24/2013  . Impingement syndrome of left shoulder 09/18/2013  . S/P arthroscopy of shoulder 09/18/2013  . Low back pain 03/17/2013  . Neuropathy 03/17/2013  . Metabolic syndrome 98/92/1194  . Overweight (BMI 25.0-29.9) 03/17/2013  . Vitamin D deficiency 12/01/2012  . Chronic insomnia 12/01/2012  . Fibrocystic breast changes 05/21/2011  . Essential hypertension   . Hyperlipidemia   . Peptic ulcer disease   . ITP (idiopathic thrombocytopenic purpura)   . Rhinitis   . Elevated blood sugar   . Hypothyroidism   . Goiter   . Colon polyps   . Adhesion of abdominal wall   . Esophageal stricture    Past Medical History:  Diagnosis Date  . Adhesion of abdominal wall    "continues to have abdominal pain pain intermittent right abdominal side. "scar tissue"  . Arthritis   . Breast cyst   . Bronchitis, asthmatic    'bronchitis that will go into an asthma like attack but haven't had it in years'  . Colon polyps   . Elevated blood sugar   . Esophageal stricture    "occ. problems with swallowing water"  . Full dentures   . GI bleed   . Goiter   . History of blood transfusion   . History of hiatal hernia    repaired over 20 years ago  .  ITP (idiopathic thrombocytopenic purpura)    not a problem now.  . Other and unspecified hyperlipidemia   . Peptic ulcer disease   . Rhinitis   . Unspecified essential hypertension   . Unspecified hypothyroidism     Family History  Problem Relation Age of Onset  . Lung cancer Father   . Heart disease Mother        Unkown.  Died age 28  . Emphysema Sister     Past Surgical History:  Procedure Laterality Date  . ABDOMINAL HYSTERECTOMY    . APPENDECTOMY    . BACK SURGERY     x1 Lumbar, x2 cervical anterior and posterior .  Marland Kitchen BREAST SURGERY     x3 biopsy  . cataract surgery Bilateral   . CHOLECYSTECTOMY    . EYE  SURGERY Bilateral 2007   cataracts   . HEMORRHOID SURGERY    . HERNIA REPAIR    . JOINT REPLACEMENT    . LUMBAR LAMINECTOMY N/A 05/16/2015   Procedure: MICRODISCECTOMY Lumbar 3-Lumbar 4;  Surgeon: Jessy Oto, MD;  Location: [facility];  Service: Orthopedics;  Laterality: N/A;  . 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 THYROID SURGERY     Total "goiter removal"  . TOTAL KNEE ARTHROPLASTY Right 2010   Social History   Occupational History  . Not on file.   Social History Main Topics  . Smoking status: Passive Smoke Exposure - Never Smoker    Start date: 03/12/1945    Last attempt to quit: 03/12/1982  . Smokeless tobacco: Never Used  . Alcohol use No  . Drug use: No  . Sexual activity: Not Currently

## 2016-11-28 NOTE — Patient Instructions (Signed)
Avoid bending, stooping and avoid lifting weights greater than 10 lbs. Avoid prolong standing and walking. Avoid frequent bending and stooping  No lifting greater than 10 lbs. May use ice or moist heat for pain. Weight loss is of benefit. Handicap license is approved. Dr. Arlean Hopping secretary/Assistant will call to arrange for a right sacroplasty(cement injection into the area of the chronic right sacral fracture) with a right sacrum bone biopsy. Weight bearing as tolerated right leg with a walker .

## 2016-11-30 LAB — SEDIMENTATION RATE: SED RATE: 9 mm/h (ref 0–30)

## 2016-11-30 LAB — PROTEIN ELECTROPHORESIS, SERUM, WITH REFLEX
Albumin ELP: 2.9 g/dL — ABNORMAL LOW (ref 3.8–4.8)
Alpha 1: 0.4 g/dL — ABNORMAL HIGH (ref 0.2–0.3)
Alpha 2: 0.8 g/dL (ref 0.5–0.9)
Beta 2: 0.5 g/dL (ref 0.2–0.5)
Beta Globulin: 0.4 g/dL (ref 0.4–0.6)
Gamma Globulin: 1.8 g/dL — ABNORMAL HIGH (ref 0.8–1.7)
TOTAL PROTEIN: 6.8 g/dL (ref 6.1–8.1)

## 2016-12-06 ENCOUNTER — Other Ambulatory Visit (HOSPITAL_COMMUNITY): Payer: Self-pay | Admitting: Interventional Radiology

## 2016-12-06 DIAGNOSIS — M8448XA Pathological fracture, other site, initial encounter for fracture: Secondary | ICD-10-CM

## 2016-12-07 ENCOUNTER — Other Ambulatory Visit: Payer: Self-pay | Admitting: Family Medicine

## 2016-12-07 NOTE — Telephone Encounter (Signed)
Last seen 5.31.18  DWM

## 2016-12-11 DIAGNOSIS — F4323 Adjustment disorder with mixed anxiety and depressed mood: Secondary | ICD-10-CM | POA: Diagnosis not present

## 2016-12-12 DIAGNOSIS — F4323 Adjustment disorder with mixed anxiety and depressed mood: Secondary | ICD-10-CM | POA: Diagnosis not present

## 2016-12-13 ENCOUNTER — Encounter: Payer: Self-pay | Admitting: Family Medicine

## 2016-12-13 ENCOUNTER — Ambulatory Visit (INDEPENDENT_AMBULATORY_CARE_PROVIDER_SITE_OTHER): Payer: Medicare Other | Admitting: Family Medicine

## 2016-12-13 VITALS — BP 129/76 | HR 73 | Temp 97.5°F | Ht 65.0 in | Wt 151.0 lb

## 2016-12-13 DIAGNOSIS — Z23 Encounter for immunization: Secondary | ICD-10-CM

## 2016-12-13 DIAGNOSIS — S3210XG Unspecified fracture of sacrum, subsequent encounter for fracture with delayed healing: Secondary | ICD-10-CM

## 2016-12-13 DIAGNOSIS — E039 Hypothyroidism, unspecified: Secondary | ICD-10-CM

## 2016-12-13 DIAGNOSIS — E7800 Pure hypercholesterolemia, unspecified: Secondary | ICD-10-CM

## 2016-12-13 DIAGNOSIS — D696 Thrombocytopenia, unspecified: Secondary | ICD-10-CM

## 2016-12-13 DIAGNOSIS — H8309 Labyrinthitis, unspecified ear: Secondary | ICD-10-CM | POA: Diagnosis not present

## 2016-12-13 DIAGNOSIS — R42 Dizziness and giddiness: Secondary | ICD-10-CM

## 2016-12-13 DIAGNOSIS — E559 Vitamin D deficiency, unspecified: Secondary | ICD-10-CM | POA: Diagnosis not present

## 2016-12-13 DIAGNOSIS — I10 Essential (primary) hypertension: Secondary | ICD-10-CM | POA: Diagnosis not present

## 2016-12-13 DIAGNOSIS — R531 Weakness: Secondary | ICD-10-CM

## 2016-12-13 MED ORDER — MECLIZINE HCL 25 MG PO TABS: 12.5000 mg | ORAL_TABLET | Freq: Three times a day (TID) | ORAL | 0 refills | Status: DC | PRN

## 2016-12-13 NOTE — Progress Notes (Signed)
Subjective:    Patient ID: [name], female    DOB: 1925-04-05, 83 y.o.   MRN: [id number]  HPI Pt here for follow up and management of chronic medical problems which includes hyperlipidemia and hypertension. She is taking medication regularly.The patient had a fall in June. She is been followed regularly by specialist in [place]. She complains of dizziness today. Her vital signs are stable although her weight has increased by 7 pounds since it was last checked. She will be given an FOBT to return and will get lab work and a flu shot today. She continues to take her atorvastatin blood pressure medicine and thyroid medicine for those conditions that she is being followed for. She is also taking gabapentin for her chronic pain along with Cymbalta. The patient is seeing Dr. Samson Frederic for her hip pain and spondylolisthesis. He also follows her for her spinal stenosis. Based on his last note she has a lot of restrictions. She is to return to see him again soon. She will have cement placed for a closed fracture of the right pelvis. This fracture of the sacrum had delayed healing. This will be done in mid November. The patient noted some dizziness this morning. She has had a busy week according to her son who comes with her to the visit today. The dizziness can be replicated by looking up and down her looking from side to side. She denies any chest pain or shortness of breath anymore than usual. Her activity level has been very minimal because of the sacral fracture and orders from the orthopedic surgeon. She understands the importance of being patient and waiting for all of this to work out on a day by day basis. She denies any chest pain or shortness of breath. She denies any trouble with her stomach including nausea vomiting diarrhea or blood in the stool as passing her water without problems. The patient says that since her fall and the lack of healing with the sacral fracture has been very limiting to  her. She has pain with sitting with movement and with walking.     Patient Active Problem List   Diagnosis Date Noted  . Lumbar radiculopathy 03/14/2016  . HNP (herniated nucleus pulposus), cervical 05/16/2015    Class: Chronic  . Herniated nucleus pulposus, lumbar 05/16/2015  . Abnormal EKG 04/21/2015  . Thrombocytopenia ([place]) 08/18/2014  . Insomnia 11/24/2013  . Impingement syndrome of left shoulder 09/18/2013  . S/P arthroscopy of shoulder 09/18/2013  . Low back pain 03/17/2013  . Neuropathy 03/17/2013  . Metabolic syndrome 37/90/2409  . Overweight (BMI 25.0-29.9) 03/17/2013  . Vitamin D deficiency 12/01/2012  . Chronic insomnia 12/01/2012  . Fibrocystic breast changes 05/21/2011  . Essential hypertension   . Hyperlipidemia   . Peptic ulcer disease   . ITP (idiopathic thrombocytopenic purpura)   . Rhinitis   . Elevated blood sugar   . Hypothyroidism   . Goiter   . Colon polyps   . Adhesion of abdominal wall   . Esophageal stricture    Outpatient Encounter Prescriptions as of 12/13/2016  Medication Sig  . atorvastatin (LIPITOR) 80 MG tablet TAKE 1 TABLET ONCE A DAY  . calcium citrate-vitamin D (CITRACAL+D) 315-200 MG-UNIT per tablet Take 1 tablet by mouth daily.  . Cholecalciferol (VITAMIN D3) 2000 UNITS TABS Take 1 tablet by mouth daily.    . DULoxetine (CYMBALTA) 60 MG capsule Take 60 mg by mouth daily.   . fluticasone (FLONASE) 50 MCG/ACT nasal spray Place  2 sprays into both nostrils daily.  Marland Kitchen gabapentin (NEURONTIN) 300 MG capsule Take 300 mg by mouth at bedtime.   Marland Kitchen HYDROcodone-acetaminophen (NORCO/VICODIN) 5-325 MG tablet Take 1 tablet by mouth every 6 (six) hours as needed for moderate pain. Take 1/2 tablet by mouth every 12 hours when necessary for pain.  Marland Kitchen levothyroxine (LEVOTHROID) 25 MCG tablet Take 1/2 tablet (12.76mg) daily.  Along with 1572m of Synthroid  . levothyroxine (SYNTHROID, LEVOTHROID) 150 MCG tablet TAKE 1 TABLET DAILY  . LORazepam (ATIVAN) 0.5  MG tablet Take 1 tablet (0.5 mg total) by mouth at bedtime.  . methocarbamol (ROBAXIN) 500 MG tablet Take 1 tablet (500 mg total) by mouth every 8 (eight) hours as needed for muscle spasms.  . Multiple Vitamin (MULTIVITAMIN WITH MINERALS) TABS tablet Take 1 tablet by mouth daily.  . Marland Kitchenmeprazole (PRILOSEC) 20 MG capsule Take 20 mg by mouth daily.  . polyvinyl alcohol (LIQUIFILM TEARS) 1.4 % ophthalmic solution Place 1 drop into both eyes as needed for dry eyes.  . potassium chloride (K-DUR) 10 MEQ tablet Take 1 tablet (10 mEq total) by mouth daily.  . traZODone (DESYREL) 50 MG tablet TAKE UP TO 2 TABLETS AT BEDTIME FOR SLEEP  . valsartan-hydrochlorothiazide (DIOVAN-HCT) 320-25 MG tablet TAKE 1 TABLET DAILY  . [DISCONTINUED] DULoxetine (CYMBALTA) 20 MG capsule Take 1 capsule (20 mg total) by mouth daily.  . [DISCONTINUED] gabapentin (NEURONTIN) 100 MG capsule Take 1 capsule (100 mg total) by mouth at bedtime.   Facility-Administered Encounter Medications as of 12/13/2016  Medication  . calcitonin (salmon) (MIACALCIN/FORTICAL) nasal spray 1 spray  . lidocaine (PF) (XYLOCAINE) 1 % injection 0.3 mL     Review of Systems  Constitutional: Negative.   HENT: Negative.   Eyes: Negative.   Respiratory: Negative.   Cardiovascular: Negative.   Gastrointestinal: Negative.   Endocrine: Negative.   Genitourinary: Negative.   Musculoskeletal: Negative.        Fall in June - following ortho  Skin: Negative.   Allergic/Immunologic: Negative.   Neurological: Positive for dizziness (today).  Hematological: Negative.   Psychiatric/Behavioral: Negative.        Objective:   Physical Exam  Constitutional: She is oriented to person, place, and time. She appears well-developed and well-nourished.  Pleasant and alert despite her ongoing pain with her back and hip.  HENT:  Head: Normocephalic and atraumatic.  Right Ear: External ear normal.  Left Ear: External ear normal.  Nose: Nose normal.    Mouth/Throat: Oropharynx is clear and moist. No oropharyngeal exudate.  Eyes: Pupils are equal, round, and reactive to light. Conjunctivae and EOM are normal. Right eye exhibits no discharge. Left eye exhibits no discharge. No scleral icterus.  Neck: Normal range of motion. Neck supple. No thyromegaly present.  No bruits thyromegaly or anterior cervical adenopathy  Cardiovascular: Normal rate, regular rhythm and normal heart sounds.   No murmur heard. The heart is regular at 72/m  Pulmonary/Chest: Effort normal and breath sounds normal. No respiratory distress. She has no wheezes. She has no rales.  Clear anteriorly and posteriorly  Abdominal: Soft. Bowel sounds are normal. She exhibits no mass. There is no tenderness. There is no rebound and no guarding.  No abdominal tenderness masses or organ enlargement. No suprapubic tenderness.  Musculoskeletal: She exhibits edema and tenderness.  Limited range of motion due to back pain and left hip pain. There is pedal edema bilaterally but no pretibial edema.  Lymphadenopathy:    She has no cervical adenopathy.  Neurological:  She is alert and oriented to person, place, and time.  Dizziness was reproduced with head movement.  Skin: Skin is warm and dry. No rash noted.  Psychiatric: She has a normal mood and affect. Her behavior is normal. Judgment and thought content normal.  Nursing note and vitals reviewed.   BP 129/76 (BP Location: Left Arm)   Pulse 73   Temp (!) 97.5 F (36.4 C) (Oral)   Ht 5' 5"  (1.651 m)   Wt 151 lb (68.5 kg)   BMI 25.13 kg/m        Assessment & Plan:  1. Essential hypertension -The blood pressure is good today and she will continue with current treatment - BMP8+EGFR - CBC with Differential/Platelet - Hepatic function panel  2. Pure hypercholesterolemia -Continue with current treatment pending results of lab work - CBC with Differential/Platelet - Lipid panel  3. Vitamin D deficiency -Continue with  current treatment pending results of lab work - CBC with Differential/Platelet - VITAMIN D 25 Hydroxy (Vit-D Deficiency, Fractures)  4. Thrombocytopenia ([place]) -No bleeding issues noted by patient and her son. - CBC with Differential/Platelet  5. Hypothyroidism, unspecified type -Continue with current treatment pending results of lab work - CBC with Differential/Platelet - Thyroid Panel With TSH  6. Dizziness -Meclizine 12.5 mg one 3 times daily with food for 1 week then as needed -Drink plenty of fluids and stay well hydrated -Use walker regularly as to prevent any falls  7. Weakness -Check labs for any aberration  8. Labyrinthitis, unspecified laterality -Take meclizine as directed and drink plenty of fluids  9. Closed fracture of sacrum with delayed healing, unspecified fracture morphology, subsequent encounter -Follow-up with orthopedic surgeon as planned  Meds ordered this encounter  Medications  . DULoxetine (CYMBALTA) 60 MG capsule    Sig: Take 60 mg by mouth daily.     Refill:  0  . gabapentin (NEURONTIN) 300 MG capsule    Sig: Take 300 mg by mouth at bedtime.     Refill:  0   Patient Instructions                       Medicare Annual Wellness Visit  [facility] and the medical providers at [facility] strive to bring you the best medical care.  In doing so we not only want to address your current medical conditions and concerns but also to detect new conditions early and prevent illness, disease and health-related problems.    Medicare offers a yearly Wellness Visit which allows our clinical staff to assess your need for preventative services including immunizations, lifestyle education, counseling to decrease risk of preventable diseases and screening for fall risk and other medical concerns.    This visit is provided free of charge (no copay) for all Medicare recipients. The clinical pharmacists at [facility] have  begun to conduct these Wellness Visits which will also include a thorough review of all your medications.    As you primary medical provider recommend that you make an appointment for your Annual Wellness Visit if you have not done so already this year.  You may set up this appointment before you leave today or you may call back ([phone]) and schedule an appointment.  Please make sure when you call that you mention that you are scheduling your Annual Wellness Visit with the clinical pharmacist so that the appointment may be made for the proper length of time.     Continue  current medications. Continue good therapeutic lifestyle changes which include good diet and exercise. Fall precautions discussed with patient. If an FOBT was given today- please return it to our front desk. If you are over 12 years old - you may need Prevnar 61 or the adult Pneumonia vaccine.  **Flu shots are available--- please call and schedule a FLU-CLINIC appointment**  After your visit with [place] today you will receive a survey in the mail or online from [employer] regarding your care with [place]. Please take a moment to fill this out. Your feedback is very important to [place] as you can help [place] better understand your patient needs as well as improve your experience and satisfaction. WE CARE ABOUT YOU!!!     Arrie Senate MD

## 2016-12-13 NOTE — Addendum Note (Signed)
Addended by: Zannie Cove on: 12/13/2016 11:17 AM   Modules accepted: Orders

## 2016-12-13 NOTE — Patient Instructions (Addendum)
Medicare Annual Wellness Visit  [facility] and the medical providers at [facility] strive to bring you the best medical care.  In doing so we not only want to address your current medical conditions and concerns but also to detect new conditions early and prevent illness, disease and health-related problems.    Medicare offers a yearly Wellness Visit which allows our clinical staff to assess your need for preventative services including immunizations, lifestyle education, counseling to decrease risk of preventable diseases and screening for fall risk and other medical concerns.    This visit is provided free of charge (no copay) for all Medicare recipients. The clinical pharmacists at [facility] have begun to conduct these Wellness Visits which will also include a thorough review of all your medications.    As you primary medical provider recommend that you make an appointment for your Annual Wellness Visit if you have not done so already this year.  You may set up this appointment before you leave today or you may call back ([phone]) and schedule an appointment.  Please make sure when you call that you mention that you are scheduling your Annual Wellness Visit with the clinical pharmacist so that the appointment may be made for the proper length of time.     Continue current medications. Continue good therapeutic lifestyle changes which include good diet and exercise. Fall precautions discussed with patient. If an FOBT was given today- please return it to our front desk. If you are over 70 years old - you may need Prevnar 41 or the adult Pneumonia vaccine.  **Flu shots are available--- please call and schedule a FLU-CLINIC appointment**  After your visit with [place] today you will receive a survey in the mail or online from [employer] regarding your care with [place]. Please take a moment to fill this out. Your feedback is very  important to [place] as you can help [place] better understand your patient needs as well as improve your experience and satisfaction. WE CARE ABOUT YOU!!!  Take meclizine regularly for at least a week then as needed Drink plenty of fluids and stay well hydrated We will call with lab work results as soon as those results become available

## 2016-12-14 LAB — CBC WITH DIFFERENTIAL/PLATELET
BASOS ABS: 0.1 10*3/uL (ref 0.0–0.2)
Basos: 1 %
EOS (ABSOLUTE): 0.4 10*3/uL (ref 0.0–0.4)
EOS: 4 %
HEMATOCRIT: 38.9 % (ref 34.0–46.6)
HEMOGLOBIN: 12.3 g/dL (ref 11.1–15.9)
Immature Grans (Abs): 0.2 10*3/uL — ABNORMAL HIGH (ref 0.0–0.1)
Immature Granulocytes: 2 %
LYMPHS ABS: 3.1 10*3/uL (ref 0.7–3.1)
Lymphs: 31 %
MCH: 31.1 pg (ref 26.6–33.0)
MCHC: 31.6 g/dL (ref 31.5–35.7)
MCV: 99 fL — ABNORMAL HIGH (ref 79–97)
MONOCYTES: 3 %
MONOS ABS: 0.3 10*3/uL (ref 0.1–0.9)
NEUTROS ABS: 5.8 10*3/uL (ref 1.4–7.0)
Neutrophils: 59 %
Platelets: 171 10*3/uL (ref 150–379)
RBC: 3.95 x10E6/uL (ref 3.77–5.28)
RDW: 14.3 % (ref 12.3–15.4)
WBC: 9.9 10*3/uL (ref 3.4–10.8)

## 2016-12-14 LAB — THYROID PANEL WITH TSH
FREE THYROXINE INDEX: 0.8 — AB (ref 1.2–4.9)
T3 Uptake Ratio: 24 % (ref 24–39)
T4 TOTAL: 3.5 ug/dL — AB (ref 4.5–12.0)
TSH: 161.8 u[IU]/mL — AB (ref 0.450–4.500)

## 2016-12-14 LAB — LIPID PANEL
CHOL/HDL RATIO: 2.5 ratio (ref 0.0–4.4)
Cholesterol, Total: 140 mg/dL (ref 100–199)
HDL: 56 mg/dL (ref >39)
LDL CALC: 60 mg/dL (ref 0–99)
Triglycerides: 121 mg/dL (ref 0–149)
VLDL Cholesterol Cal: 24 mg/dL (ref 5–40)

## 2016-12-14 LAB — BMP8+EGFR
BUN / CREAT RATIO: 9 — AB (ref 12–28)
BUN: 8 mg/dL (ref 8–27)
CHLORIDE: 98 mmol/L (ref 96–106)
CO2: 30 mmol/L — ABNORMAL HIGH (ref 20–29)
Calcium: 8.3 mg/dL — ABNORMAL LOW (ref 8.7–10.3)
Creatinine, Ser: 0.89 mg/dL (ref 0.57–1.00)
GFR calc non Af Amer: 58 mL/min/{1.73_m2} — ABNORMAL LOW (ref >59)
GFR, EST AFRICAN AMERICAN: 66 mL/min/{1.73_m2} (ref >59)
GLUCOSE: 104 mg/dL — AB (ref 65–99)
Potassium: 3.1 mmol/L — ABNORMAL LOW (ref 3.5–5.2)
SODIUM: 143 mmol/L (ref 134–144)

## 2016-12-14 LAB — VITAMIN D 25 HYDROXY (VIT D DEFICIENCY, FRACTURES): VIT D 25 HYDROXY: 37.8 ng/mL (ref 30.0–100.0)

## 2016-12-14 LAB — HEPATIC FUNCTION PANEL
ALBUMIN: 2.8 g/dL — AB (ref 3.5–4.7)
ALT: 8 IU/L (ref 0–32)
AST: 18 IU/L (ref 0–40)
Alkaline Phosphatase: 138 IU/L — ABNORMAL HIGH (ref 39–117)
Bilirubin Total: 0.6 mg/dL (ref 0.0–1.2)
Bilirubin, Direct: 0.24 mg/dL (ref 0.00–0.40)
TOTAL PROTEIN: 6.3 g/dL (ref 6.0–8.5)

## 2016-12-14 LAB — PLEASE NOTE

## 2016-12-18 ENCOUNTER — Other Ambulatory Visit: Payer: Self-pay

## 2016-12-18 DIAGNOSIS — R7989 Other specified abnormal findings of blood chemistry: Secondary | ICD-10-CM

## 2016-12-18 DIAGNOSIS — E876 Hypokalemia: Secondary | ICD-10-CM

## 2016-12-24 ENCOUNTER — Ambulatory Visit (HOSPITAL_COMMUNITY)
Admission: RE | Admit: 2016-12-24 | Discharge: 2016-12-24 | Disposition: A | Discharge status: Home / Self Care | Payer: Medicare Other | Source: Ambulatory Visit | Attending: Interventional Radiology | Admitting: Interventional Radiology

## 2016-12-24 DIAGNOSIS — S3210XA Unspecified fracture of sacrum, initial encounter for closed fracture: Secondary | ICD-10-CM | POA: Diagnosis not present

## 2016-12-24 DIAGNOSIS — M5126 Other intervertebral disc displacement, lumbar region: Secondary | ICD-10-CM | POA: Diagnosis not present

## 2016-12-24 DIAGNOSIS — M8448XA Pathological fracture, other site, initial encounter for fracture: Secondary | ICD-10-CM

## 2016-12-24 HISTORY — PX: IR RADIOLOGIST EVAL & MGMT: IMG5224

## 2016-12-25 ENCOUNTER — Other Ambulatory Visit (HOSPITAL_COMMUNITY): Payer: Self-pay | Admitting: Interventional Radiology

## 2016-12-25 ENCOUNTER — Encounter (HOSPITAL_COMMUNITY): Payer: Self-pay | Admitting: Interventional Radiology

## 2016-12-25 DIAGNOSIS — M8448XA Pathological fracture, other site, initial encounter for fracture: Secondary | ICD-10-CM

## 2016-12-27 ENCOUNTER — Other Ambulatory Visit: Payer: Self-pay | Admitting: General Surgery

## 2016-12-28 ENCOUNTER — Other Ambulatory Visit (HOSPITAL_COMMUNITY): Payer: Self-pay | Admitting: Interventional Radiology

## 2016-12-28 ENCOUNTER — Ambulatory Visit (HOSPITAL_COMMUNITY)
Admission: RE | Admit: 2016-12-28 | Discharge: 2016-12-28 | Disposition: A | Discharge status: Home / Self Care | Payer: Medicare Other | Source: Ambulatory Visit | Attending: Interventional Radiology | Admitting: Interventional Radiology

## 2016-12-28 DIAGNOSIS — M4858XA Collapsed vertebra, not elsewhere classified, sacral and sacrococcygeal region, initial encounter for fracture: Secondary | ICD-10-CM | POA: Diagnosis not present

## 2016-12-28 DIAGNOSIS — S3210XA Unspecified fracture of sacrum, initial encounter for closed fracture: Secondary | ICD-10-CM | POA: Diagnosis not present

## 2016-12-28 DIAGNOSIS — M5126 Other intervertebral disc displacement, lumbar region: Secondary | ICD-10-CM | POA: Diagnosis not present

## 2016-12-28 DIAGNOSIS — M48061 Spinal stenosis, lumbar region without neurogenic claudication: Secondary | ICD-10-CM | POA: Diagnosis not present

## 2016-12-28 DIAGNOSIS — Z7722 Contact with and (suspected) exposure to environmental tobacco smoke (acute) (chronic): Secondary | ICD-10-CM | POA: Insufficient documentation

## 2016-12-28 DIAGNOSIS — J45909 Unspecified asthma, uncomplicated: Secondary | ICD-10-CM | POA: Insufficient documentation

## 2016-12-28 DIAGNOSIS — E785 Hyperlipidemia, unspecified: Secondary | ICD-10-CM | POA: Diagnosis not present

## 2016-12-28 DIAGNOSIS — M533 Sacrococcygeal disorders, not elsewhere classified: Secondary | ICD-10-CM | POA: Diagnosis not present

## 2016-12-28 DIAGNOSIS — M8448XA Pathological fracture, other site, initial encounter for fracture: Secondary | ICD-10-CM | POA: Insufficient documentation

## 2016-12-28 DIAGNOSIS — G8929 Other chronic pain: Secondary | ICD-10-CM | POA: Diagnosis not present

## 2016-12-28 DIAGNOSIS — M1990 Unspecified osteoarthritis, unspecified site: Secondary | ICD-10-CM | POA: Diagnosis not present

## 2016-12-28 DIAGNOSIS — E039 Hypothyroidism, unspecified: Secondary | ICD-10-CM | POA: Insufficient documentation

## 2016-12-28 DIAGNOSIS — M898X8 Other specified disorders of bone, other site: Secondary | ICD-10-CM | POA: Diagnosis not present

## 2016-12-28 DIAGNOSIS — I10 Essential (primary) hypertension: Secondary | ICD-10-CM | POA: Insufficient documentation

## 2016-12-28 HISTORY — PX: IR SACROPLASTY BILATERAL: IMG5561

## 2016-12-28 LAB — CBC
HEMATOCRIT: 43.1 % (ref 36.0–46.0)
Hemoglobin: 13.6 g/dL (ref 12.0–15.0)
MCH: 31.6 pg (ref 26.0–34.0)
MCHC: 31.6 g/dL (ref 30.0–36.0)
MCV: 100.2 fL — AB (ref 78.0–100.0)
Platelets: 132 10*3/uL — ABNORMAL LOW (ref 150–400)
RBC: 4.3 MIL/uL (ref 3.87–5.11)
RDW: 14.9 % (ref 11.5–15.5)
WBC: 12.1 10*3/uL — AB (ref 4.0–10.5)

## 2016-12-28 LAB — BASIC METABOLIC PANEL
Anion gap: 10 (ref 5–15)
BUN: 14 mg/dL (ref 6–20)
CHLORIDE: 98 mmol/L — AB (ref 101–111)
CO2: 31 mmol/L (ref 22–32)
Calcium: 9.2 mg/dL (ref 8.9–10.3)
Creatinine, Ser: 1.3 mg/dL — ABNORMAL HIGH (ref 0.44–1.00)
GFR calc Af Amer: 41 mL/min — ABNORMAL LOW (ref >60)
GFR calc non Af Amer: 35 mL/min — ABNORMAL LOW (ref >60)
GLUCOSE: 107 mg/dL — AB (ref 65–99)
POTASSIUM: 3.2 mmol/L — AB (ref 3.5–5.1)
Sodium: 139 mmol/L (ref 135–145)

## 2016-12-28 LAB — URINALYSIS, COMPLETE (UACMP) WITH MICROSCOPIC
BILIRUBIN URINE: NEGATIVE
Bacteria, UA: NONE SEEN
GLUCOSE, UA: NEGATIVE mg/dL
Hgb urine dipstick: NEGATIVE
KETONES UR: NEGATIVE mg/dL
Leukocytes, UA: NEGATIVE
Nitrite: NEGATIVE
PH: 6 (ref 5.0–8.0)
Protein, ur: NEGATIVE mg/dL
Specific Gravity, Urine: 1.012 (ref 1.005–1.030)
Squamous Epithelial / LPF: NONE SEEN

## 2016-12-28 LAB — PROTIME-INR
INR: 0.97
Prothrombin Time: 12.8 seconds (ref 11.4–15.2)

## 2016-12-28 MED ORDER — FENTANYL CITRATE (PF) 100 MCG/2ML IJ SOLN
INTRAMUSCULAR | Status: AC
  Filled 2016-12-28: qty 2

## 2016-12-28 MED ORDER — TOBRAMYCIN SULFATE 1.2 G IJ SOLR
INTRAMUSCULAR | Status: AC
  Filled 2016-12-28: qty 1.2

## 2016-12-28 MED ORDER — FENTANYL CITRATE (PF) 100 MCG/2ML IJ SOLN
INTRAMUSCULAR | Status: AC | PRN
  Administered 2016-12-28: 25 ug via INTRAVENOUS

## 2016-12-28 MED ORDER — GELATIN ABSORBABLE 12-7 MM EX MISC
CUTANEOUS | Status: AC | PRN
  Administered 2016-12-28: 1 via TOPICAL

## 2016-12-28 MED ORDER — MIDAZOLAM HCL 2 MG/2ML IJ SOLN
INTRAMUSCULAR | Status: AC | PRN
  Administered 2016-12-28: 1 mg via INTRAVENOUS

## 2016-12-28 MED ORDER — SODIUM CHLORIDE 0.9 % IV SOLN: INTRAVENOUS | Status: DC

## 2016-12-28 MED ORDER — BUPIVACAINE HCL 0.25 % IJ SOLN
INTRAMUSCULAR | Status: AC | PRN
  Administered 2016-12-28: 15 mL

## 2016-12-28 MED ORDER — BUPIVACAINE HCL (PF) 0.5 % IJ SOLN
INTRAMUSCULAR | Status: AC
  Filled 2016-12-28: qty 30

## 2016-12-28 MED ORDER — SODIUM CHLORIDE 0.9 % IV SOLN
INTRAVENOUS | Status: AC
  Administered 2016-12-28: 15:00:00 via INTRAVENOUS

## 2016-12-28 MED ORDER — CEFAZOLIN SODIUM-DEXTROSE 2-4 GM/100ML-% IV SOLN
2.0000 g | INTRAVENOUS | Status: AC
  Administered 2016-12-28: 2 g via INTRAVENOUS

## 2016-12-28 MED ORDER — GELATIN ABSORBABLE 12-7 MM EX MISC
CUTANEOUS | Status: AC
  Filled 2016-12-28: qty 1

## 2016-12-28 MED ORDER — IOPAMIDOL (ISOVUE-300) INJECTION 61%
INTRAVENOUS | Status: AC
  Administered 2016-12-28: 1 mL
  Filled 2016-12-28: qty 50

## 2016-12-28 MED ORDER — MIDAZOLAM HCL 2 MG/2ML IJ SOLN
INTRAMUSCULAR | Status: AC
  Filled 2016-12-28: qty 2

## 2016-12-28 MED ORDER — CEFAZOLIN SODIUM-DEXTROSE 2-4 GM/100ML-% IV SOLN
INTRAVENOUS | Status: AC
  Filled 2016-12-28: qty 100

## 2016-12-28 MED ORDER — ACETAMINOPHEN 325 MG PO TABS
650.0000 mg | ORAL_TABLET | Freq: Four times a day (QID) | ORAL | Status: DC | PRN
  Administered 2016-12-28: 650 mg via ORAL
  Filled 2016-12-28: qty 2

## 2016-12-28 MED ORDER — TOBRAMYCIN SULFATE 1.2 G IJ SOLR
INTRAMUSCULAR | Status: AC | PRN
  Administered 2016-12-28: .1 g via TOPICAL

## 2016-12-28 MED ORDER — ACETAMINOPHEN 325 MG PO TABS
ORAL_TABLET | ORAL | Status: AC
  Filled 2016-12-28: qty 2

## 2016-12-28 NOTE — Sedation Documentation (Signed)
Patient is resting comfortably. 

## 2016-12-28 NOTE — H&P (Signed)
Chief Complaint: Patient was seen in consultation today for sacral fracture  Referring Physician(s): Dr. Basil Dess  Supervising Physician: Luanne Bras  Patient Status: [name]  History of Present Illness: [name] is a 83 y.o. female with past medical history of HTN, arthritis, HLD, and chronic back pain from spinal stenosis of the lumbar region was referred to Dr. Estanislado Pandy due to recent MR findings of sacral fractures.   MR Lumbar Spine 11/28/16 shows: Edema and enhancement throughout the RIGHT sacral ala consistent with a posttraumatic insufficiency fracture. In retrospect, RIGHT sacral alar injury can be seen on prior MR from 2016/09/27, but is much more pronounced on today's exam, consistent with interval healing.  Moderate to severe stenosis at L3-4 is redemonstrated, similar to priors. This relates to 6 mm of slip, posterior element hypertrophy, and a large disc extrusion with cephalad migrated free fragment. RIGHT greater than LEFT L3 and L4 nerve root impingement. See discussion above.  Patient was seen in consultation with Dr. Estanislado Pandy 12/24/16 to discuss management options for her fractures. She elected to proceed with sacroplasty and presents for procedure today.  She has been NPO. She does not take blood thinners.   Past Medical History:  Diagnosis Date  . Adhesion of abdominal wall    "continues to have abdominal pain pain intermittent right abdominal side. "scar tissue"  . Arthritis   . Breast cyst   . Bronchitis, asthmatic    'bronchitis that will go into an asthma like attack but haven't had it in years'  . Colon polyps   . Elevated blood sugar   . Esophageal stricture    "occ. problems with swallowing water"  . Full dentures   . GI bleed   . Goiter   . History of blood transfusion   . History of hiatal hernia    repaired over 20 years ago  . ITP (idiopathic thrombocytopenic purpura)    not a problem now.  . Other and  unspecified hyperlipidemia   . Peptic ulcer disease   . Rhinitis   . Unspecified essential hypertension   . Unspecified hypothyroidism     Past Surgical History:  Procedure Laterality Date  . ABDOMINAL HYSTERECTOMY    . APPENDECTOMY    . BACK SURGERY     x1 Lumbar, x2 cervical anterior and posterior .  Mar[facility] Kitchen BREAST SURGERY     x3 biopsy  . cataract surgery Bilateral   . CHOLECYSTECTOMY    . EYE SURGERY Bilateral 2007   cataracts   . HEMORRHOID SURGERY    . HERNIA REPAIR    . IR RADIOLOGIST EVAL & MGMT  12/24/2016  . JOINT REPLACEMENT    . LUMBAR LAMINECTOMY N/A 05/16/2015   Procedure: MICRODISCECTOMY Lumbar 3-Lumbar 4;  Surgeon: Jessy Oto, MD;  Location: [facility];  Service: Orthopedics;  Laterality: N/A;  . 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 THYROID SURGERY     Total "goiter removal"  . TOTAL KNEE ARTHROPLASTY Right 2010    Allergies: Prednisone  Medications: Prior to Admission medications   Medication Sig Start Date End Date Taking? Authorizing Provider  atorvastatin (LIPITOR) 80 MG tablet TAKE 1 TABLET ONCE A DAY 06/05/16  Yes Chipper Herb, MD  calcium citrate-vitamin D (CITRACAL+D) 315-200 MG-UNIT per tablet Take 1 tablet by mouth daily.   Yes [provider]  Cholecalciferol (VITAMIN D3) 2000 UNITS TABS Take 1 tablet by mouth daily.     Yes  [provider]  fluticasone (FLONASE) 50 MCG/ACT nasal spray Place 2 sprays into both nostrils daily. 08/09/16  Yes Chipper Herb, MD  HYDROcodone-acetaminophen (NORCO/VICODIN) 5-325 MG tablet Take 1 tablet by mouth every 6 (six) hours as needed for moderate pain. Take 1/2 tablet by mouth every 12 hours when necessary for pain. 11/28/16  Yes Jessy Oto, MD  levothyroxine (SYNTHROID, LEVOTHROID) 150 MCG tablet TAKE 1 TABLET DAILY 08/14/16  Yes Chipper Herb, MD  meclizine (ANTIVERT) 25 MG tablet Take 0.5 tablets (12.5 mg total) by mouth 3 (three) times daily as needed for dizziness. 12/13/16   Yes Chipper Herb, MD  Multiple Vitamin (MULTIVITAMIN WITH MINERALS) TABS tablet Take 1 tablet by mouth daily.   Yes [provider]  polyvinyl alcohol (LIQUIFILM TEARS) 1.4 % ophthalmic solution Place 1 drop into both eyes as needed for dry eyes.   Yes [provider]  potassium chloride (K-DUR) 10 MEQ tablet Take 1 tablet (10 mEq total) by mouth daily. 04/27/16  Yes Chipper Herb, MD  traZODone (DESYREL) 50 MG tablet TAKE UP TO 2 TABLETS AT BEDTIME FOR SLEEP 12/07/16  Yes Claretta Fraise, MD  valsartan-hydrochlorothiazide (DIOVAN-HCT) 320-25 MG tablet TAKE 1 TABLET DAILY 06/05/16  Yes Chipper Herb, MD     Family History  Problem Relation Age of Onset  . Lung cancer Father   . Heart disease Mother        Unkown.  Died age 67  . Emphysema Sister     Social History   Social History  . Marital status: Married    Spouse name: N/A  . Number of children: N/A  . Years of education: N/A   Social History Main Topics  . Smoking status: Passive Smoke Exposure - Never Smoker    Start date: 03/12/1945    Last attempt to quit: 03/12/1982  . Smokeless tobacco: Never Used  . Alcohol use No  . Drug use: No  . Sexual activity: Not Currently   Other Topics Concern  . Not on file   Social History Narrative   Lives at home with wife and son.     Review of Systems  Constitutional: Negative for fatigue.  Respiratory: Negative for cough and shortness of breath.   Cardiovascular: Negative for chest pain.  Gastrointestinal: Negative for abdominal pain.  Genitourinary: Negative for dysuria, frequency and urgency.  Musculoskeletal: Positive for back pain.  Psychiatric/Behavioral: Negative for behavioral problems and confusion.    Vital Signs: There were no vitals taken for this visit.  Physical Exam  Constitutional: She is oriented to person, place, and time. She appears well-developed.  Cardiovascular: Normal rate, regular rhythm and normal heart sounds.     Pulmonary/Chest: Effort normal and breath sounds normal. No respiratory distress.  Abdominal: Soft.  Musculoskeletal:  Back pain with movement.   Neurological: She is alert and oriented to person, place, and time.  Skin: Skin is warm and dry.  Psychiatric: She has a normal mood and affect. Her behavior is normal. Judgment and thought content normal.  Nursing note and vitals reviewed.   Imaging: Ir Radiologist Eval & Mgmt  Result Date: 12/25/2016 EXAM: NEW PATIENT OFFICE VISIT CHIEF COMPLAINT: Severe low back pain, and sacral pain right greater than left. Current Pain Level: 1-10 HISTORY OF PRESENT ILLNESS: The patient is an 83 year old lady who has been referred for evaluation for pain treatment due to sacral insufficiency fractures right greater than left. The patient is accompanied by her granddaughter and her  niece. History corroborated by both of them. According to the patient, the patient fell a few weeks ago on her buttocks. Thereafter, she started experiencing severe pain in her low back region more so over the right sacral ala than the left side. The patient reports that the pain is intermittent. However, when it is severe and incapacitating, it keeps her from walking or doing her daily chores. Her pain is usually brought on by prolonged standing or sitting on a hard surface for prolonged periods of time or turning. When extremely severe, the pain is an 8 out of 10 with intermittent radiation on the outer lateral aspect of the thigh to just below her knee. She claims occasionally it may radiate into her foot. The pain may be sharp but mostly a knawing achy pain. The pain is relieved by the patient lying down, or change in her position. The patient is able to sleep at night although at times the pain keeps her awake. She denies any autonomic dysfunction of her bowel or bladder activities. She denies any UTI symptoms of dysuria, hematuria, or of polyuria. She denies recent chills, fever or  rigors. At home she lives with her son and is ambulatory with a walker. The patient is able to feed and clean herself and do daily chores. The patient does not drive. Her pain is occasionally relieved by taking Tylenol. When extremely severe, she takes a hydrocodone tablet. Her weight and her appetite have been on the down side since her husband died. However, she has seen no significant change following the onset of the fracture and the resulting pain. Past Medical History: Herniated nucleus pulposis in the cervical region in March of 2017 chronic. Lumbar radiculopathy due to herniated nucleus pulposis. Abnormal EKG. Thrombocytopenia. Insomnia. Impingement syndrome of the left shoulder. Arthroscopy of her shoulder. Low back pain. Neuropathy. Metabolic syndrome. Vitamin-D deficiency. Fibrocystic breast disease. Essential hypertension hyperlipidemia. Peptic ulcer disease. Idiopathic thrombocytopenic purpura. Rhinitis. Diabetes. Hypothyroidism. Goiter. Colon polyps. Adhesions of the abdominal wall. Esophageal stricture. Past Surgical History: Abdominal hysterectomy. Appendectomy. Back surgery once in the lumbar region and twice in the cervical spine region. Breast surgery biopsy. Cataract surgery. Cholecystectomy. Eye surgery. Hemorrhoidectomy. Hernia repair. Joint replacement. Lumbar laminectomy. Splenectomy. Thyroid surgery. Total knee arthroplasty on the right. Medications: Lipitor, calcium citrate vitamin D tablets, cholecalciferol vitamin D3 tablets, Cymbalta, Flonase nasal spray, neurontin, hydrocodone acetaminophen 5/325 tablets. Takes half a tablet every 12 hours as needed for pain. Levothyroxine, meclizine, Robaxin, multi vitamins, ophthalmic solution Liquifilm tears 1.4% to each eye, potassium chloride tablets, Desyrel, valsartan hydrochlorothiazide tablets, calcitonin. Allergies: Prednisone which causes generalized swelling, and breathing difficulties. Social History:  Widowed.  Has 3 sons alive and well.  Denies smoking cigarettes or using alcohol or illicit chemicals. Is on no special diet. Family History: Mother deceased age 19, heart problems. Dad deceased age 59, lung carcinoma. Brother deceased age 80s, cancer. Sister deceased age 35s, emphysema. REVIEW OF SYSTEMS: Negative unless as mentioned above. PHYSICAL EXAMINATION: In no acute distress.  Affect appropriate to the situation. Neurologically grossly intact. Station and gait not examined. No gross lateralizing cranial nerve abnormalities, motor, sensory or coordination difficulties. The patient points to the right hip region, and also the mid low back at the site of her pain. ASSESSMENT AND PLAN: The patient's recent MRI scan of lumbosacral spine and of the pelvis was reviewed with her and her family. Brought to their attention was the abnormal T2 and FLAIR signal intensity involving the right sacral ala, with patchy  hyperintense signal also in the left ala. Also brought to their attention was the severe degenerate disc disease in the lumbar region at L3-L4, L4-L5 with anterior displacement of L3 on L4 with pseudodisc protrusion with resulting neural foraminal compromise bilaterally with encroachment without discrete contact with the adjacent L4 nerve root. Given the patient's clinical history and neuro imaging findings, it was felt the patient would benefit from augmentation with methylmethacrylate mixture of sacral insufficiency fractures bilaterally. The procedure, risks, benefits and alternatives were all reviewed in detail with the patient, her granddaughter and also her niece. Questions were answered to their satisfaction. They would like to proceed with the augmentation of the sacrum with the methylmethacrylate mixture under local anesthetic. This will be scheduled at the earliest possible. In the meantime, the patient was then asked to continue using her walker all the time, and also to refrain from stooping, bending or lifting anything above 10  pounds. The patient and the family leave with good understanding and agreement with the above management plan. Should they have any questions or concerns, they were asked to call. Electronically Signed   By: Luanne Bras M.D.   On: 12/24/2016 19:46    Labs:  CBC:  Recent Labs  03/26/16 1035 08/09/16 1034 12/13/16 1106 12/28/16 0925  WBC 11.3* 9.1 9.9 12.1*  HGB 13.4 12.0 12.3 13.6  HCT 40.3 36.5 38.9 43.1  PLT 145* 143* 171 132*    COAGS:  Recent Labs  12/28/16 0925  INR 0.97    BMP:  Recent Labs  03/26/16 1035 08/09/16 1034 11/27/16 1645 12/13/16 1106 12/28/16 0925  NA 142 143  --  143 139  K 3.5 3.5  --  3.1* 3.2*  CL 102 101  --  98 98*  CO2 24 26  --  30* 31  GLUCOSE 115* 106*  --  104* 107*  BUN 15 9  --  8 14  CALCIUM 9.2 9.2  --  8.3* 9.2  CREATININE 0.90 0.93 0.90 0.89 1.30*  GFRNONAA 57* 55*  --  58* 35*  GFRAA 66 63  --  66 41*    LIVER FUNCTION TESTS:  Recent Labs  03/26/16 1035 08/09/16 1034 11/28/16 1148 12/13/16 1106  BILITOT 0.6 0.2  --  0.6  AST 17 18  --  18  ALT 11 8  --  8  ALKPHOS 83 83  --  138*  PROT 7.4 6.5 6.8 6.3  ALBUMIN 4.0 3.4*  --  2.8*    TUMOR MARKERS: No results for input(s): AFPTM, CEA, CA199, CHROMGRNA in the last 8760 hours.  Assessment and Plan: Sacral insufficiency fractures Patient with history of sacral fractures and low back pain familiar to Dr. Estanislado Pandy from recent consultation for same 12/24/16. She presents to radiology department for procedure today.  She denies fever, chills, urinary urgency or frequency, new cough, however is noted to have an elevated WBC at 12.1 A UA was obtained and shows no signs of infection. Discussed with Dr. Estanislado Pandy who agrees to proceed with case.  Risks and benefits of sacroplasy were discussed with the patient including, but not limited to education regarding the natural healing process of compression fractures without intervention, bleeding, infection, cement  migration which may cause spinal cord damage, paralysis, pulmonary embolism or even death.  This interventional procedure involves the use of X-rays and because of the nature of the planned procedure, it is possible that we will have prolonged use of X-ray fluoroscopy.  Potential radiation risks to  you include (but are not limited to) the following: - A slightly elevated risk for cancer  several years later in life. This risk is typically less than 0.5% percent. This risk is low in comparison to the normal incidence of human cancer, which is 33% for women and 50% for men according to the [employer]. - Radiation induced injury can include skin redness, resembling a rash, tissue breakdown / ulcers and hair loss (which can be temporary or permanent).   The likelihood of either of these occurring depends on the difficulty of the procedure and whether you are sensitive to radiation due to previous procedures, disease, or genetic conditions.   IF your procedure requires a prolonged use of radiation, you will be notified and given written instructions for further action.  It is your responsibility to monitor the irradiated area for the 2 weeks following the procedure and to notify your physician if you are concerned that you have suffered a radiation induced injury.    All of the patient's questions were answered, patient is agreeable to proceed.  Consent signed and in chart.   Thank you for this interesting consult.  I greatly enjoyed meeting [name] and look forward to participating in their care.  A copy of this report was sent to the requesting provider on this date.  Electronically Signed: Docia Barrier, PA 12/28/2016, 11:59 AM   I spent a total of    15 Minutes in face to face in clinical consultation, greater than 50% of which was counseling/coordinating care for sacral fractures

## 2016-12-28 NOTE — Procedures (Signed)
S/P sacroplasty bilateral approach with biopsies.

## 2016-12-28 NOTE — Discharge Instructions (Signed)
KYPHOPLASTY/VERTEBROPLASTY DISCHARGE INSTRUCTIONS  Medications: (check all that apply)     Resume all home medications as before procedure.                      Continue your pain medications as prescribed as needed.  Over the next 3-5 days, decrease your pain medication as tolerated.  Over the counter medications (i.e. Tylenol, ibuprofen, and aleve) may be substituted once severe/moderate pain symptoms have subsided.   Wound Care: - Bandages may be removed the day following your procedure.  You may get your incision wet once bandages are removed.  Bandaids may be used to cover the incisions until scab formation.  Topical ointments are optional.  - If you develop a fever greater than 101 degrees, have increased skin redness at the incision sites or pus-like oozing from incisions occurring within 1 week of the procedure, contact radiology at [phone] or [phone].  - Ice pack to back for 15-20 minutes 2-3 time per day for first 2-3 days post procedure.  The ice will expedite muscle healing and help with the pain from the incisions.   Activity: - Bedrest today with limited activity for 24 hours post procedure.  - No driving for 48 hours.  - Increase your activity as tolerated after bedrest (with assistance if necessary).  - Refrain from any strenuous activity or heavy lifting (greater than 10 lbs.).   Follow up: - Contact radiology at [phone] or [phone] if any questions/concerns.  - A physician assistant from radiology will contact you in approximately 1 week.  - If a biopsy was performed at the time of your procedure, your referring physician should receive the results in usually 2-3 days.        1. No stooping ,bending or lifting more than 10 lbs for 2 weeks. 2.Use walker to ambulate. 3.RTC PRN 2 weeks.

## 2017-01-03 ENCOUNTER — Other Ambulatory Visit: Payer: Self-pay | Admitting: Family Medicine

## 2017-01-03 ENCOUNTER — Ambulatory Visit (INDEPENDENT_AMBULATORY_CARE_PROVIDER_SITE_OTHER): Payer: Medicare Other | Admitting: Specialist

## 2017-01-05 ENCOUNTER — Emergency Department (HOSPITAL_BASED_OUTPATIENT_CLINIC_OR_DEPARTMENT_OTHER)
Admission: EM | Admit: 2017-01-05 | Discharge: 2017-01-05 | Disposition: A | Discharge status: Home / Self Care | Payer: Medicare Other | Attending: Emergency Medicine | Admitting: Emergency Medicine

## 2017-01-05 ENCOUNTER — Encounter (HOSPITAL_BASED_OUTPATIENT_CLINIC_OR_DEPARTMENT_OTHER): Payer: Self-pay | Admitting: Emergency Medicine

## 2017-01-05 DIAGNOSIS — E039 Hypothyroidism, unspecified: Secondary | ICD-10-CM | POA: Insufficient documentation

## 2017-01-05 DIAGNOSIS — I10 Essential (primary) hypertension: Secondary | ICD-10-CM | POA: Diagnosis not present

## 2017-01-05 DIAGNOSIS — Z79899 Other long term (current) drug therapy: Secondary | ICD-10-CM | POA: Insufficient documentation

## 2017-01-05 DIAGNOSIS — R410 Disorientation, unspecified: Secondary | ICD-10-CM | POA: Insufficient documentation

## 2017-01-05 DIAGNOSIS — Z7722 Contact with and (suspected) exposure to environmental tobacco smoke (acute) (chronic): Secondary | ICD-10-CM | POA: Diagnosis not present

## 2017-01-05 DIAGNOSIS — R4182 Altered mental status, unspecified: Secondary | ICD-10-CM | POA: Diagnosis present

## 2017-01-05 LAB — BASIC METABOLIC PANEL
Anion gap: 6 (ref 5–15)
BUN: 17 mg/dL (ref 6–20)
CO2: 31 mmol/L (ref 22–32)
Calcium: 9.3 mg/dL (ref 8.9–10.3)
Chloride: 104 mmol/L (ref 101–111)
Creatinine, Ser: 0.98 mg/dL (ref 0.44–1.00)
GFR calc Af Amer: 58 mL/min — ABNORMAL LOW (ref >60)
GFR calc non Af Amer: 50 mL/min — ABNORMAL LOW (ref >60)
Glucose, Bld: 108 mg/dL — ABNORMAL HIGH (ref 65–99)
Potassium: 3.2 mmol/L — ABNORMAL LOW (ref 3.5–5.1)
Sodium: 141 mmol/L (ref 135–145)

## 2017-01-05 LAB — CBC WITH DIFFERENTIAL/PLATELET
Band Neutrophils: 0 %
Basophils Absolute: 0.4 10*3/uL — ABNORMAL HIGH (ref 0.0–0.1)
Basophils Relative: 3 %
Blasts: 0 %
Eosinophils Absolute: 0.1 10*3/uL (ref 0.0–0.7)
Eosinophils Relative: 1 %
HCT: 36 % (ref 36.0–46.0)
Hemoglobin: 11.3 g/dL — ABNORMAL LOW (ref 12.0–15.0)
Lymphocytes Relative: 24 %
Lymphs Abs: 3.4 10*3/uL (ref 0.7–4.0)
MCH: 31.7 pg (ref 26.0–34.0)
MCHC: 31.4 g/dL (ref 30.0–36.0)
MCV: 100.8 fL — ABNORMAL HIGH (ref 78.0–100.0)
Metamyelocytes Relative: 0 %
Monocytes Absolute: 0.1 10*3/uL (ref 0.1–1.0)
Monocytes Relative: 1 %
Myelocytes: 0 %
Neutro Abs: 10.1 10*3/uL — ABNORMAL HIGH (ref 1.7–7.7)
Neutrophils Relative %: 71 %
Platelets: 147 10*3/uL — ABNORMAL LOW (ref 150–400)
Promyelocytes Absolute: 0 %
RBC: 3.57 MIL/uL — ABNORMAL LOW (ref 3.87–5.11)
RDW: 13.6 % (ref 11.5–15.5)
WBC: 14.1 10*3/uL — ABNORMAL HIGH (ref 4.0–10.5)
nRBC: 0 /100 WBC

## 2017-01-05 LAB — URINALYSIS, ROUTINE W REFLEX MICROSCOPIC
Bilirubin Urine: NEGATIVE
Glucose, UA: NEGATIVE mg/dL
Hgb urine dipstick: NEGATIVE
Ketones, ur: NEGATIVE mg/dL
Leukocytes, UA: NEGATIVE
Nitrite: NEGATIVE
Protein, ur: NEGATIVE mg/dL
Specific Gravity, Urine: 1.01 (ref 1.005–1.030)
pH: 6 (ref 5.0–8.0)

## 2017-01-05 LAB — CBG MONITORING, ED: Glucose-Capillary: 111 mg/dL — ABNORMAL HIGH (ref 65–99)

## 2017-01-05 MED ORDER — POTASSIUM CHLORIDE CRYS ER 20 MEQ PO TBCR
40.0000 meq | EXTENDED_RELEASE_TABLET | Freq: Once | ORAL | Status: AC
  Administered 2017-01-05: 40 meq via ORAL
  Filled 2017-01-05: qty 2

## 2017-01-05 MED ORDER — MAGNESIUM OXIDE 400 (241.3 MG) MG PO TABS
400.0000 mg | ORAL_TABLET | Freq: Once | ORAL | Status: DC
  Filled 2017-01-05: qty 1

## 2017-01-05 NOTE — ED Provider Notes (Signed)
[facility] EMERGENCY DEPARTMENT Provider Note   CSN: [id number] Arrival date & time: 01/05/17  1558     History   Chief Complaint Chief Complaint  Patient presents with  . Altered Mental Status    HPI [name] is a 83 y.o. female.  HPI   83 year old female with some.  Onset a few days ago.  Patient is apparently alert and oriented x4.  She has been somewhat slow and not as sharp as she normally is.  No fevers or chills.  She did have kyphoplasty on Friday.  She reports that her back pain is significantly improved.  She has no acute urinary complaints.  No cough.  Past Medical History:  Diagnosis Date  . Adhesion of abdominal wall    "continues to have abdominal pain pain intermittent right abdominal side. "scar tissue"  . Arthritis   . Breast cyst   . Bronchitis, asthmatic    'bronchitis that will go into an asthma like attack but haven't had it in years'  . Colon polyps   . Elevated blood sugar   . Esophageal stricture    "occ. problems with swallowing water"  . Full dentures   . GI bleed   . Goiter   . History of blood transfusion   . History of hiatal hernia    repaired over 20 years ago  . ITP (idiopathic thrombocytopenic purpura)    not a problem now.  . Other and unspecified hyperlipidemia   . Peptic ulcer disease   . Rhinitis   . Unspecified essential hypertension   . Unspecified hypothyroidism     Patient Active Problem List   Diagnosis Date Noted  . Lumbar radiculopathy 03/14/2016  . HNP (herniated nucleus pulposus), cervical 05/16/2015    Class: Chronic  . Herniated nucleus pulposus, lumbar 05/16/2015  . Abnormal EKG 04/21/2015  . Thrombocytopenia ([place]) 08/18/2014  . Insomnia 11/24/2013  . Impingement syndrome of left shoulder 09/18/2013  . S/P arthroscopy of shoulder 09/18/2013  . Low back pain 03/17/2013  . Neuropathy 03/17/2013  . Metabolic syndrome 40/12/2723  . Overweight (BMI 25.0-29.9) 03/17/2013  . Vitamin D  deficiency 12/01/2012  . Chronic insomnia 12/01/2012  . Fibrocystic breast changes 05/21/2011  . Essential hypertension   . Hyperlipidemia   . Peptic ulcer disease   . ITP (idiopathic thrombocytopenic purpura)   . Rhinitis   . Elevated blood sugar   . Hypothyroidism   . Goiter   . Colon polyps   . Adhesion of abdominal wall   . Esophageal stricture     Past Surgical History:  Procedure Laterality Date  . ABDOMINAL HYSTERECTOMY    . APPENDECTOMY    . BACK SURGERY     x1 Lumbar, x2 cervical anterior and posterior .  Marland Kitchen BREAST SURGERY     x3 biopsy  . cataract surgery Bilateral   . CHOLECYSTECTOMY    . EYE SURGERY Bilateral 2007   cataracts   . HEMORRHOID SURGERY    . HERNIA REPAIR    . IR RADIOLOGIST EVAL & MGMT  12/24/2016  . JOINT REPLACEMENT    . LUMBAR LAMINECTOMY N/A 05/16/2015   Procedure: MICRODISCECTOMY Lumbar 3-Lumbar 4;  Surgeon: Jessy Oto, MD;  Location: [facility];  Service: Orthopedics;  Laterality: N/A;  . 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 THYROID SURGERY     Total "goiter removal"  . TOTAL KNEE ARTHROPLASTY Right 2010    OB History  No data available       Home Medications    Prior to Admission medications   Medication Sig Start Date End Date Taking? Authorizing Provider  atorvastatin (LIPITOR) 80 MG tablet TAKE 1 TABLET ONCE A DAY 06/05/16   Chipper Herb, MD  calcium citrate-vitamin D (CITRACAL+D) 315-200 MG-UNIT per tablet Take 1 tablet by mouth daily.    [provider]  Cholecalciferol (VITAMIN D3) 2000 UNITS TABS Take 1 tablet by mouth daily.      [provider]  fluticasone (FLONASE) 50 MCG/ACT nasal spray Place 2 sprays into both nostrils daily. 08/09/16   Chipper Herb, MD  HYDROcodone-acetaminophen (NORCO/VICODIN) 5-325 MG tablet Take 1 tablet by mouth every 6 (six) hours as needed for moderate pain. Take 1/2 tablet by mouth every 12 hours when necessary for pain. 11/28/16   Jessy Oto, MD    levothyroxine (SYNTHROID, LEVOTHROID) 150 MCG tablet TAKE 1 TABLET DAILY 08/14/16   Chipper Herb, MD  meclizine (ANTIVERT) 25 MG tablet TAKE 1/2 TABLET 3 TIMES A DAY AS NEEDED FOR DIZZINESS 01/04/17   Chipper Herb, MD  Multiple Vitamin (MULTIVITAMIN WITH MINERALS) TABS tablet Take 1 tablet by mouth daily.    [provider]  polyvinyl alcohol (LIQUIFILM TEARS) 1.4 % ophthalmic solution Place 1 drop into both eyes as needed for dry eyes.    [provider]  potassium chloride (K-DUR) 10 MEQ tablet Take 1 tablet (10 mEq total) by mouth daily. 04/27/16   Chipper Herb, MD  traZODone (DESYREL) 50 MG tablet TAKE UP TO 2 TABLETS AT BEDTIME FOR SLEEP 12/07/16   Claretta Fraise, MD  valsartan-hydrochlorothiazide (DIOVAN-HCT) 320-25 MG tablet TAKE 1 TABLET DAILY 06/05/16   Chipper Herb, MD    Family History Family History  Problem Relation Age of Onset  . Lung cancer Father   . Heart disease Mother        Unkown.  Died age 4  . Emphysema Sister     Social History Social History  Substance Use Topics  . Smoking status: Passive Smoke Exposure - Never Smoker    Start date: 03/12/1945    Last attempt to quit: 03/12/1982  . Smokeless tobacco: Never Used  . Alcohol use No     Allergies   Prednisone   Review of Systems Review of Systems   Physical Exam Updated Vital Signs BP (!) 132/58 (BP Location: Left Arm)   Pulse 99   Temp 98.8 F (37.1 C) (Oral)   Resp 18   Wt 68.5 kg (151 lb)   SpO2 98%   BMI 25.13 kg/m   Physical Exam  Constitutional: She is oriented to person, place, and time. She appears well-developed and well-nourished. No distress.  HENT:  Head: Normocephalic and atraumatic.  Eyes: Conjunctivae are normal. Right eye exhibits no discharge. Left eye exhibits no discharge.  Neck: Neck supple.  Cardiovascular: Normal rate, regular rhythm and normal heart sounds. Exam reveals no gallop and no friction rub.  No murmur heard. Pulmonary/Chest: Effort  normal and breath sounds normal. No respiratory distress.  Abdominal: Soft. She exhibits no distension. There is no tenderness.  Musculoskeletal: She exhibits no edema or tenderness.  Neurological: She is alert and oriented to person, place, and time.  Speech clear.  Content appropriate.  Follows commands.  She is alert and oriented to person place and time.  Cranial nerves II through XII are intact.  Strength is 5 out of 5 bilateral upper and lower  extremities.  Skin: Skin is warm and dry.  Skin at sight of kyphoplasty appears well without signs of infection.  Psychiatric: She has a normal mood and affect. Her behavior is normal. Thought content normal.  Nursing note and vitals reviewed.    ED Treatments / Results  Labs (all labs ordered are listed, but only abnormal results are displayed) Labs Reviewed  CBC WITH DIFFERENTIAL/PLATELET - Abnormal; Notable for the following components:      Result Value   WBC 14.1 (*)    RBC 3.57 (*)    Hemoglobin 11.3 (*)    MCV 100.8 (*)    Platelets 147 (*)    Neutro Abs 10.1 (*)    Basophils Absolute 0.4 (*)    All other components within normal limits  BASIC METABOLIC PANEL - Abnormal; Notable for the following components:   Potassium 3.2 (*)    Glucose, Bld 108 (*)    GFR calc non Af Amer 50 (*)    GFR calc Af Amer 58 (*)    All other components within normal limits  CBG MONITORING, ED - Abnormal; Notable for the following components:   Glucose-Capillary 111 (*)    All other components within normal limits  URINALYSIS, ROUTINE W REFLEX MICROSCOPIC    EKG  EKG Interpretation None       Radiology No results found.  Procedures Procedures (including critical care time)  Medications Ordered in ED Medications  potassium chloride SA (K-DUR,KLOR-CON) CR tablet 40 mEq (not administered)  magnesium oxide (MAG-OX) tablet 400 mg (not administered)     Initial Impression / Assessment and Plan / ED Course  I have reviewed the triage  vital signs and the nursing notes.  Pertinent labs & imaging results that were available during my care of the patient were reviewed by me and considered in my medical decision making (see chart for details).     83 year old female with some mild she is afebrile.  General he appears well.  Workup fairly unremarkable S2 explanation for the symptoms.  I doubt CVA.  And follow with PCP. Final Clinical Impressions(s) / ED Diagnoses   Final diagnoses:  Confusion    New Prescriptions New Prescriptions   No medications on file     Virgel Manifold, MD 01/21/17 1904

## 2017-01-05 NOTE — ED Notes (Signed)
Attempted to get Urine. Patient couldn't pee at the time. Told Dr. Wilson Singer and he said it was okay to do an In and Out cath.

## 2017-01-05 NOTE — ED Triage Notes (Addendum)
Per daughter, pt has been confused over the past few days. Normally CAO x 4.  Denies pain. Had cement placed in her back on Friday.

## 2017-01-07 ENCOUNTER — Telehealth: Payer: Self-pay

## 2017-01-07 DIAGNOSIS — R410 Disorientation, unspecified: Secondary | ICD-10-CM

## 2017-01-07 NOTE — Telephone Encounter (Signed)
Went to the ER this weekend for confusion  Could not find medical reason  Wants to be referred to Neurology   [facility] PCP

## 2017-01-07 NOTE — Addendum Note (Signed)
Addended by: Chevis Pretty on: 01/07/2017 01:44 PM   Modules accepted: Orders

## 2017-01-08 ENCOUNTER — Ambulatory Visit (INDEPENDENT_AMBULATORY_CARE_PROVIDER_SITE_OTHER): Payer: Medicare Other | Admitting: Specialist

## 2017-01-09 ENCOUNTER — Other Ambulatory Visit: Payer: Self-pay | Admitting: Family Medicine

## 2017-01-14 ENCOUNTER — Encounter (HOSPITAL_COMMUNITY): Payer: Self-pay | Admitting: Interventional Radiology

## 2017-02-01 ENCOUNTER — Emergency Department (HOSPITAL_COMMUNITY): Payer: Medicare Other

## 2017-02-01 ENCOUNTER — Observation Stay (HOSPITAL_COMMUNITY)
Admission: EM | Admit: 2017-02-01 | Discharge: 2017-02-03 | Disposition: A | Discharge status: Home / Self Care | Payer: Medicare Other | Attending: Internal Medicine | Admitting: Internal Medicine

## 2017-02-01 ENCOUNTER — Encounter (HOSPITAL_COMMUNITY): Payer: Self-pay | Admitting: Emergency Medicine

## 2017-02-01 ENCOUNTER — Other Ambulatory Visit: Payer: Self-pay

## 2017-02-01 DIAGNOSIS — E039 Hypothyroidism, unspecified: Secondary | ICD-10-CM | POA: Diagnosis not present

## 2017-02-01 DIAGNOSIS — I10 Essential (primary) hypertension: Secondary | ICD-10-CM | POA: Diagnosis not present

## 2017-02-01 DIAGNOSIS — R410 Disorientation, unspecified: Secondary | ICD-10-CM | POA: Diagnosis not present

## 2017-02-01 DIAGNOSIS — N179 Acute kidney failure, unspecified: Principal | ICD-10-CM | POA: Insufficient documentation

## 2017-02-01 DIAGNOSIS — Z79899 Other long term (current) drug therapy: Secondary | ICD-10-CM | POA: Insufficient documentation

## 2017-02-01 DIAGNOSIS — M5116 Intervertebral disc disorders with radiculopathy, lumbar region: Secondary | ICD-10-CM

## 2017-02-01 DIAGNOSIS — R911 Solitary pulmonary nodule: Secondary | ICD-10-CM | POA: Diagnosis not present

## 2017-02-01 DIAGNOSIS — N390 Urinary tract infection, site not specified: Secondary | ICD-10-CM | POA: Insufficient documentation

## 2017-02-01 DIAGNOSIS — E860 Dehydration: Secondary | ICD-10-CM

## 2017-02-01 DIAGNOSIS — R4182 Altered mental status, unspecified: Secondary | ICD-10-CM | POA: Diagnosis not present

## 2017-02-01 DIAGNOSIS — E876 Hypokalemia: Secondary | ICD-10-CM | POA: Diagnosis not present

## 2017-02-01 DIAGNOSIS — M5416 Radiculopathy, lumbar region: Secondary | ICD-10-CM

## 2017-02-01 DIAGNOSIS — Z96651 Presence of right artificial knee joint: Secondary | ICD-10-CM | POA: Diagnosis not present

## 2017-02-01 DIAGNOSIS — Z7722 Contact with and (suspected) exposure to environmental tobacco smoke (acute) (chronic): Secondary | ICD-10-CM | POA: Diagnosis not present

## 2017-02-01 DIAGNOSIS — S32591A Other specified fracture of right pubis, initial encounter for closed fracture: Secondary | ICD-10-CM

## 2017-02-01 LAB — CBC WITH DIFFERENTIAL/PLATELET
BASOS ABS: 0 10*3/uL (ref 0.0–0.1)
Basophils Relative: 0 %
EOS PCT: 2 %
Eosinophils Absolute: 0.4 10*3/uL (ref 0.0–0.7)
HEMATOCRIT: 36.6 % (ref 36.0–46.0)
Hemoglobin: 11.4 g/dL — ABNORMAL LOW (ref 12.0–15.0)
LYMPHS ABS: 3.2 10*3/uL (ref 0.7–4.0)
LYMPHS PCT: 18 %
MCH: 30.6 pg (ref 26.0–34.0)
MCHC: 31.1 g/dL (ref 30.0–36.0)
MCV: 98.4 fL (ref 78.0–100.0)
MONO ABS: 0.5 10*3/uL (ref 0.1–1.0)
Monocytes Relative: 3 %
NEUTROS ABS: 13.2 10*3/uL — AB (ref 1.7–7.7)
Neutrophils Relative %: 77 %
Platelets: 133 10*3/uL — ABNORMAL LOW (ref 150–400)
RBC: 3.72 MIL/uL — AB (ref 3.87–5.11)
RDW: 12.9 % (ref 11.5–15.5)
WBC: 17.2 10*3/uL — ABNORMAL HIGH (ref 4.0–10.5)

## 2017-02-01 LAB — COMPREHENSIVE METABOLIC PANEL
ALT: 16 U/L (ref 14–54)
AST: 30 U/L (ref 15–41)
Albumin: 3.1 g/dL — ABNORMAL LOW (ref 3.5–5.0)
Alkaline Phosphatase: 111 U/L (ref 38–126)
Anion gap: 8 (ref 5–15)
BUN: 37 mg/dL — AB (ref 6–20)
CHLORIDE: 102 mmol/L (ref 101–111)
CO2: 27 mmol/L (ref 22–32)
CREATININE: 2.26 mg/dL — AB (ref 0.44–1.00)
Calcium: 9.7 mg/dL (ref 8.9–10.3)
GFR calc Af Amer: 21 mL/min — ABNORMAL LOW (ref >60)
GFR, EST NON AFRICAN AMERICAN: 18 mL/min — AB (ref >60)
GLUCOSE: 116 mg/dL — AB (ref 65–99)
POTASSIUM: 3 mmol/L — AB (ref 3.5–5.1)
Sodium: 137 mmol/L (ref 135–145)
Total Bilirubin: 0.6 mg/dL (ref 0.3–1.2)
Total Protein: 6.9 g/dL (ref 6.5–8.1)

## 2017-02-01 LAB — TSH: TSH: 0.852 u[IU]/mL (ref 0.350–4.500)

## 2017-02-01 LAB — URINALYSIS, ROUTINE W REFLEX MICROSCOPIC
BILIRUBIN URINE: NEGATIVE
Glucose, UA: NEGATIVE mg/dL
Hgb urine dipstick: NEGATIVE
KETONES UR: NEGATIVE mg/dL
Nitrite: NEGATIVE
Protein, ur: NEGATIVE mg/dL
Specific Gravity, Urine: 1.009 (ref 1.005–1.030)
pH: 5 (ref 5.0–8.0)

## 2017-02-01 LAB — TROPONIN I: TROPONIN I: 0.03 ng/mL — AB (ref <0.03)

## 2017-02-01 LAB — CBG MONITORING, ED: GLUCOSE-CAPILLARY: 108 mg/dL — AB (ref 65–99)

## 2017-02-01 MED ORDER — SODIUM CHLORIDE 0.9 % IV SOLN
INTRAVENOUS | Status: DC
  Administered 2017-02-01 – 2017-02-02 (×3): via INTRAVENOUS

## 2017-02-01 MED ORDER — VALSARTAN-HYDROCHLOROTHIAZIDE 320-25 MG PO TABS: 1.0000 | ORAL_TABLET | Freq: Every day | ORAL | Status: DC

## 2017-02-01 MED ORDER — POTASSIUM CHLORIDE CRYS ER 20 MEQ PO TBCR
20.0000 meq | EXTENDED_RELEASE_TABLET | Freq: Once | ORAL | Status: AC
  Administered 2017-02-01: 20 meq via ORAL
  Filled 2017-02-01: qty 1

## 2017-02-01 MED ORDER — ENSURE ENLIVE PO LIQD
237.0000 mL | Freq: Two times a day (BID) | ORAL | Status: DC
  Administered 2017-02-02 – 2017-02-03 (×2): 237 mL via ORAL

## 2017-02-01 MED ORDER — SODIUM CHLORIDE 0.9% FLUSH
3.0000 mL | Freq: Two times a day (BID) | INTRAVENOUS | Status: DC
  Administered 2017-02-01 – 2017-02-03 (×3): 3 mL via INTRAVENOUS

## 2017-02-01 MED ORDER — DULOXETINE HCL 30 MG PO CPEP
60.0000 mg | ORAL_CAPSULE | Freq: Every morning | ORAL | Status: DC
  Administered 2017-02-02 – 2017-02-03 (×2): 60 mg via ORAL
  Filled 2017-02-01 (×2): qty 2

## 2017-02-01 MED ORDER — CALCIUM CARBONATE-VITAMIN D 500-200 MG-UNIT PO TABS
1.0000 | ORAL_TABLET | Freq: Every day | ORAL | Status: DC
  Administered 2017-02-02 – 2017-02-03 (×2): 1 via ORAL
  Filled 2017-02-01 (×2): qty 1

## 2017-02-01 MED ORDER — DEXTROSE 5 % IV SOLN
1.0000 g | INTRAVENOUS | Status: DC
  Administered 2017-02-02: 1 g via INTRAVENOUS
  Filled 2017-02-01 (×2): qty 10

## 2017-02-01 MED ORDER — ACETAMINOPHEN 325 MG PO TABS: 650.0000 mg | ORAL_TABLET | Freq: Four times a day (QID) | ORAL | Status: DC | PRN

## 2017-02-01 MED ORDER — ADULT MULTIVITAMIN W/MINERALS CH
1.0000 | ORAL_TABLET | Freq: Every day | ORAL | Status: DC
  Administered 2017-02-02 – 2017-02-03 (×2): 1 via ORAL
  Filled 2017-02-01 (×2): qty 1

## 2017-02-01 MED ORDER — POTASSIUM CHLORIDE CRYS ER 10 MEQ PO TBCR
10.0000 meq | EXTENDED_RELEASE_TABLET | Freq: Every day | ORAL | Status: DC
  Administered 2017-02-02 – 2017-02-03 (×2): 10 meq via ORAL
  Filled 2017-02-01 (×2): qty 1

## 2017-02-01 MED ORDER — POLYETHYLENE GLYCOL 3350 17 G PO PACK: 17.0000 g | PACK | Freq: Every day | ORAL | Status: DC | PRN

## 2017-02-01 MED ORDER — LEVOTHYROXINE SODIUM 100 MCG PO TABS
125.0000 ug | ORAL_TABLET | Freq: Every day | ORAL | Status: DC
  Administered 2017-02-02 – 2017-02-03 (×2): 125 ug via ORAL
  Filled 2017-02-01 (×2): qty 1

## 2017-02-01 MED ORDER — ONDANSETRON HCL 4 MG/2ML IJ SOLN: 4.0000 mg | Freq: Four times a day (QID) | INTRAMUSCULAR | Status: DC | PRN

## 2017-02-01 MED ORDER — ATORVASTATIN CALCIUM 40 MG PO TABS
80.0000 mg | ORAL_TABLET | Freq: Every day | ORAL | Status: DC
  Administered 2017-02-02: 80 mg via ORAL
  Filled 2017-02-01 (×2): qty 1
  Filled 2017-02-01: qty 2
  Filled 2017-02-01: qty 1

## 2017-02-01 MED ORDER — ALBUTEROL SULFATE (2.5 MG/3ML) 0.083% IN NEBU: 2.5000 mg | INHALATION_SOLUTION | RESPIRATORY_TRACT | Status: DC | PRN

## 2017-02-01 MED ORDER — ONDANSETRON HCL 4 MG PO TABS: 4.0000 mg | ORAL_TABLET | Freq: Four times a day (QID) | ORAL | Status: DC | PRN

## 2017-02-01 MED ORDER — ACETAMINOPHEN 650 MG RE SUPP: 650.0000 mg | Freq: Four times a day (QID) | RECTAL | Status: DC | PRN

## 2017-02-01 MED ORDER — HEPARIN SODIUM (PORCINE) 5000 UNIT/ML IJ SOLN
5000.0000 [IU] | Freq: Three times a day (TID) | INTRAMUSCULAR | Status: DC
  Administered 2017-02-01 – 2017-02-03 (×5): 5000 [IU] via SUBCUTANEOUS
  Filled 2017-02-01 (×5): qty 1

## 2017-02-01 MED ORDER — SENNA 8.6 MG PO TABS
1.0000 | ORAL_TABLET | Freq: Two times a day (BID) | ORAL | Status: DC
  Administered 2017-02-01 – 2017-02-03 (×4): 8.6 mg via ORAL
  Filled 2017-02-01 (×4): qty 1

## 2017-02-01 MED ORDER — POTASSIUM CHLORIDE CRYS ER 20 MEQ PO TBCR
40.0000 meq | EXTENDED_RELEASE_TABLET | Freq: Once | ORAL | Status: AC
  Administered 2017-02-01: 40 meq via ORAL
  Filled 2017-02-01: qty 2

## 2017-02-01 MED ORDER — DEXTROSE 5 % IV SOLN
1.0000 g | Freq: Once | INTRAVENOUS | Status: AC
  Administered 2017-02-01: 1 g via INTRAVENOUS
  Filled 2017-02-01: qty 10

## 2017-02-01 MED ORDER — SODIUM CHLORIDE 0.9 % IV SOLN
INTRAVENOUS | Status: DC
  Administered 2017-02-01: 18:00:00 via INTRAVENOUS

## 2017-02-01 MED ORDER — VITAMIN D 1000 UNITS PO TABS
2000.0000 [IU] | ORAL_TABLET | Freq: Every day | ORAL | Status: DC
  Administered 2017-02-02 – 2017-02-03 (×2): 2000 [IU] via ORAL
  Filled 2017-02-01 (×2): qty 2

## 2017-02-01 MED ORDER — TRAZODONE HCL 50 MG PO TABS
100.0000 mg | ORAL_TABLET | Freq: Every day | ORAL | Status: DC
  Administered 2017-02-01 – 2017-02-02 (×2): 100 mg via ORAL
  Filled 2017-02-01 (×2): qty 2

## 2017-02-01 MED ORDER — CALCITONIN (SALMON) 200 UNIT/ACT NA SOLN
1.0000 | Freq: Every day | NASAL | Status: DC
  Administered 2017-02-02 – 2017-02-03 (×2): 1 via NASAL
  Filled 2017-02-01: qty 3.7

## 2017-02-01 MED ORDER — FLUTICASONE PROPIONATE 50 MCG/ACT NA SUSP
2.0000 | Freq: Every day | NASAL | Status: DC
  Administered 2017-02-02 – 2017-02-03 (×2): 2 via NASAL
  Filled 2017-02-01: qty 16

## 2017-02-01 MED ORDER — SODIUM CHLORIDE 0.9% FLUSH: 3.0000 mL | INTRAVENOUS | Status: DC | PRN

## 2017-02-01 MED ORDER — SODIUM CHLORIDE 0.9 % IV SOLN: 250.0000 mL | INTRAVENOUS | Status: DC | PRN

## 2017-02-01 MED ORDER — POLYVINYL ALCOHOL 1.4 % OP SOLN: 1.0000 [drp] | OPHTHALMIC | Status: DC | PRN

## 2017-02-01 MED ORDER — LEVOTHYROXINE SODIUM 50 MCG PO TABS: 150.0000 ug | ORAL_TABLET | Freq: Every day | ORAL | Status: DC

## 2017-02-01 MED ORDER — TRAZODONE HCL 50 MG PO TABS: 50.0000 mg | ORAL_TABLET | Freq: Every evening | ORAL | Status: DC | PRN

## 2017-02-01 NOTE — H&P (Signed)
Patient Demographics:    [name], is a 83 y.o. female  MRN: [id number]   DOB - 1926-03-07  Admit Date - 02/01/2017  Outpatient Primary MD for the patient is Chipper Herb, MD   Assessment & Plan:    Principal Problem:   AKI (acute kidney injury) [facility]) Active Problems:   Essential hypertension   Hypothyroidism   Confusion and disorientation   UTI (urinary tract infection)    1)AKI-suspect due to decreased oral intake in the setting of losartan/HCTZ use and presumed UTI, creatinine is up to 2.26 from 0.982 ,less than 1 month ago, GFR is down to 18 from 50 less than 1 month ago, hold Valsartan/HCTZ, avoid nephrotoxic agents, hydrate IV and orally.  Treat UTI as outlined below in #3   2)Confusion/Disorientation/Toxic Encephalopathy- worse due to AKI and UTI, symptoms of confusion of cognitive impairment apparently started more than 5 weeks ago.  TSH around 6 weeks ago was 161, repeat TSH now is 0.85, CT head without acute findings.  RPR pending.  Patient already has outpatient appointment with neurology for January 2019 to evaluate for possible cognitive deficits and Dementia.  Encephalopathy probably worsened due to AK I and UTI as outlined above, however even before patient developed AK I UTI this time around she already has some cognitive and behavioral concerns  3)UTI-presumed UTI, may be contributing to #1 and #2 above, treat empirically with IV Rocephin pending urine and blood cultures.  Patient is asplenic putting her at risk for infection with encapsulated organisms  4)Hypothyroidism- TSH around 6 weeks ago was 161, repeat TSH now is 0.85, decrease L-thyroxine to 125 mcg daily  5)Recurrent Falls/Generalized Weakness-get physical therapy eval, CT head without acute findings, patient without any focal  neuro deficits at this time,  6)Persistent leukocytosis-family tells me that patient has had persistent leukocytosis since she had a splenectomy.  Treat UTI as above, cultures are pending  With History of - Reviewed by me  Past Medical History:  Diagnosis Date  . Adhesion of abdominal wall    "continues to have abdominal pain pain intermittent right abdominal side. "scar tissue"  . Arthritis   . Breast cyst   . Bronchitis, asthmatic    'bronchitis that will go into an asthma like attack but haven't had it in years'  . Colon polyps   . Elevated blood sugar   . Esophageal stricture    "occ. problems with swallowing water"  . Full dentures   . GI bleed   . Goiter   . History of blood transfusion   . History of hiatal hernia    repaired over 20 years ago  . ITP (idiopathic thrombocytopenic purpura)    not a problem now.  . Other and unspecified hyperlipidemia   . Peptic ulcer disease   . Rhinitis   . Unspecified essential hypertension   . Unspecified hypothyroidism       Past Surgical History:  Procedure Laterality  Date  . ABDOMINAL HYSTERECTOMY    . APPENDECTOMY    . BACK SURGERY     x1 Lumbar, x2 cervical anterior and posterior .  Marland Kitchen BREAST SURGERY     x3 biopsy  . cataract surgery Bilateral   . CHOLECYSTECTOMY    . EYE SURGERY Bilateral 2007   cataracts   . HEMORRHOID SURGERY    . HERNIA REPAIR    . IR RADIOLOGIST EVAL & MGMT  12/24/2016  . IR SACROPLASTY BILATERAL  12/28/2016  . JOINT REPLACEMENT    . LUMBAR LAMINECTOMY N/A 05/16/2015   Procedure: MICRODISCECTOMY Lumbar 3-Lumbar 4;  Surgeon: Jessy Oto, MD;  Location: [facility];  Service: Orthopedics;  Laterality: N/A;  . 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 THYROID SURGERY     Total "goiter removal"  . TOTAL KNEE ARTHROPLASTY Right 2010      Chief Complaint  Patient presents with  . Altered Mental Status      HPI:    [name]  is a 83 y.o. female, with PMHx relevant for  Hypothyroidism, HTN and osteopenia who presents with worsening confusion, falls, hallucinations and disorientation.   Additional history obtained from family members at bedside (sons x2, daughter in law and granddaughter), patient apparently has had cognitive deficits with behavior disturbance and hallucinations/delusions with disorientation at least for the last 5-6 weeks. Patient already has outpatient appointment with neurology for January 2019 to evaluate for possible cognitive deficits and Dementia.    In the ED today patient is found to have AKI and UTI which probably worsened her encephalopathy   No f/c, no emesis no diarrhea, no chest pains no palpitations  Patient had a fall a couple of weeks ago, CT head today without acute findings       Review of systems:    In addition to the HPI above,   A full 12 point Review of 10 Systems was done, except as stated above, all other Review of 10 Systems were negative.    Social History:  Reviewed by me    Social History   Tobacco Use  . Smoking status: Passive Smoke Exposure - Never Smoker  . Smokeless tobacco: Never Used  Substance Use Topics  . Alcohol use: No       Family History :  Reviewed by me    Family History  Problem Relation Age of Onset  . Lung cancer Father   . Heart disease Mother        Unkown.  Died age 3  . Emphysema Sister      Home Medications:   Prior to Admission medications   Medication Sig Start Date End Date Taking? Authorizing Provider  atorvastatin (LIPITOR) 80 MG tablet TAKE 1 TABLET ONCE A DAY 06/05/16  Yes Chipper Herb, MD  calcium citrate-vitamin D (CITRACAL+D) 315-200 MG-UNIT per tablet Take 1 tablet by mouth daily.   Yes [provider]  Cholecalciferol (VITAMIN D3) 2000 UNITS TABS Take 1 tablet by mouth daily.     Yes [provider]  DULoxetine (CYMBALTA) 60 MG capsule Take 60 mg by mouth every morning.   Yes [provider]  fluticasone (FLONASE) 50  MCG/ACT nasal spray Place 2 sprays into both nostrils daily. 08/09/16  Yes Chipper Herb, MD  levothyroxine (SYNTHROID, LEVOTHROID) 150 MCG tablet TAKE 1 TABLET DAILY 08/14/16  Yes Chipper Herb, MD  meclizine (ANTIVERT) 25 MG tablet TAKE 1/2 TABLET 3 TIMES A  DAY AS NEEDED FOR DIZZINESS 01/04/17  Yes Chipper Herb, MD  Multiple Vitamin (MULTIVITAMIN WITH MINERALS) TABS tablet Take 1 tablet by mouth daily.   Yes [provider]  polyvinyl alcohol (LIQUIFILM TEARS) 1.4 % ophthalmic solution Place 1 drop into both eyes as needed for dry eyes.   Yes [provider]  potassium chloride (K-DUR) 10 MEQ tablet Take 1 tablet (10 mEq total) by mouth daily. 04/27/16  Yes Chipper Herb, MD  traZODone (DESYREL) 50 MG tablet TAKE UP TO 2 TABLETS AT BEDTIME FOR SLEEP 01/10/17  Yes Chipper Herb, MD  valsartan-hydrochlorothiazide (DIOVAN-HCT) 320-25 MG tablet TAKE 1 TABLET DAILY 06/05/16  Yes Chipper Herb, MD  HYDROcodone-acetaminophen (NORCO/VICODIN) 5-325 MG tablet Take 1 tablet by mouth every 6 (six) hours as needed for moderate pain. Take 1/2 tablet by mouth every 12 hours when necessary for pain. Patient not taking: Reported on 02/01/2017 11/28/16   Jessy Oto, MD     Allergies:     Allergies  Allergen Reactions  . Prednisone Other (See Comments)    Bloated. Unable to determine if intolerance with water retention, or Possible Allergic response with swelling.     Physical Exam:   Vitals  Blood pressure (!) 149/55, pulse (!) 59, temperature 98 F (36.7 C), temperature source Oral, resp. rate 16, height 5\' 5"  (1.651 m), weight 60.1 kg (132 lb 7.9 oz), SpO2 97 %.  Physical Examination: General appearance -confused and disoriented, in no acute distress Mental status -visual hallucinations  eyes - sclera anicteric Neck - supple, no JVD elevation , Chest - clear  to auscultation bilaterally, symmetrical air movement,  Heart - S1 and S2 normal,  Abdomen - soft, nontender,  ND, healed scars from previous laparotomy including Koher's incision Neurological -confused, disoriented, generalized weakness without new focal deficits, scar from previous C-spine surgery  extremities - no pedal edema noted, intact peripheral pulses  Skin - warm, dry    Data Review:    CBC Recent Labs  Lab 02/01/17 1423  WBC 17.2*  HGB 11.4*  HCT 36.6  PLT 133*  MCV 98.4  MCH 30.6  MCHC 31.1  RDW 12.9  LYMPHSABS 3.2  MONOABS 0.5  EOSABS 0.4  BASOSABS 0.0   ------------------------------------------------------------------------------------------------------------------  Chemistries  Recent Labs  Lab 02/01/17 1423  NA 137  K 3.0*  CL 102  CO2 27  GLUCOSE 116*  BUN 37*  CREATININE 2.26*  CALCIUM 9.7  AST 30  ALT 16  ALKPHOS 111  BILITOT 0.6   ------------------------------------------------------------------------------------------------------------------ estimated creatinine clearance is 15.2 mL/min (A) (by C-G formula based on SCr of 2.26 mg/dL (H)). ------------------------------------------------------------------------------------------------------------------ Recent Labs    02/01/17 1423  TSH 0.852     Coagulation profile No results for input(s): INR, PROTIME in the last 168 hours. ------------------------------------------------------------------------------------------------------------------- No results for input(s): DDIMER in the last 72 hours. -------------------------------------------------------------------------------------------------------------------  Cardiac Enzymes Recent Labs  Lab 02/01/17 1423  TROPONINI 0.03*   ------------------------------------------------------------------------------------------------------------------ No results found for: BNP   ---------------------------------------------------------------------------------------------------------------  Urinalysis    Component Value Date/Time   COLORURINE  YELLOW 02/01/2017 1540   APPEARANCEUR HAZY (A) 02/01/2017 1540   LABSPEC 1.009 02/01/2017 1540   PHURINE 5.0 02/01/2017 1540   GLUCOSEU NEGATIVE 02/01/2017 1540   HGBUR NEGATIVE 02/01/2017 [place] 02/01/2017 1540   BILIRUBINUR neg 05/22/2013 [place] 02/01/2017 1540   PROTEINUR NEGATIVE 02/01/2017 1540   UROBILINOGEN negative 05/22/2013 1249   UROBILINOGEN 0.2 12/28/2006 0933  NITRITE NEGATIVE 02/01/2017 1540   LEUKOCYTESUR SMALL (A) 02/01/2017 1540    ----------------------------------------------------------------------------------------------------------------   Imaging Results:    Dg Chest 2 View  Result Date: 02/01/2017 CLINICAL DATA:  Altered mental status. Fatigue and loss of energy a few weeks. EXAM: CHEST  2 VIEW COMPARISON:  05/11/2015 FINDINGS: Lungs are adequately inflated without focal consolidation or effusion. Faint subcentimeter nodular density over the lateral left midlung unchanged. Mild emphysematous disease. Cardiomediastinal silhouette is within normal. There is calcified plaque over the aortic arch. Mild degenerate change of the spine. IMPRESSION: No acute cardiopulmonary disease. Mild emphysematous disease. Stable faint subcentimeter nodule over the lateral left midlung unchanged. Recommend follow-up chest radiograph 6-12 months. Electronically Signed   By: Marin Olp M.D.   On: 02/01/2017 14:14   Ct Head Wo Contrast  Result Date: 02/01/2017 CLINICAL DATA:  Initial evaluation for acute altered mental status. EXAM: CT HEAD WITHOUT CONTRAST TECHNIQUE: Contiguous axial images were obtained from the base of the skull through the vertex without intravenous contrast. COMPARISON:  [facility] CT from 12/03/2015. FINDINGS: Brain: Moderate atrophy with chronic small vessel ischemic disease. Remote lacunar infarct present within the posterior limb of the left internal capsule. No acute intracranial hemorrhage. No acute large vessel  territory infarct. No mass lesion, midline shift or mass effect. No hydrocephalus. No extra-axial fluid collection. Vascular: No hyperdense vessel. Scattered vascular calcifications noted within the carotid siphons. Skull: Scalp soft tissues and calvarium within normal limits. Sinuses/Orbits: Globes and orbital soft tissues within normal limits. Patient status post lens extraction bilaterally. Paranasal sinuses are clear. Trace right mastoid effusion noted. Other: None. IMPRESSION: 1. No acute intracranial process. 2. Moderate cerebral atrophy with chronic small vessel ischemic disease. Electronically Signed   By: Jeannine Boga M.D.   On: 02/01/2017 17:36    Radiological Exams on Admission: Dg Chest 2 View  Result Date: 02/01/2017 CLINICAL DATA:  Altered mental status. Fatigue and loss of energy a few weeks. EXAM: CHEST  2 VIEW COMPARISON:  05/11/2015 FINDINGS: Lungs are adequately inflated without focal consolidation or effusion. Faint subcentimeter nodular density over the lateral left midlung unchanged. Mild emphysematous disease. Cardiomediastinal silhouette is within normal. There is calcified plaque over the aortic arch. Mild degenerate change of the spine. IMPRESSION: No acute cardiopulmonary disease. Mild emphysematous disease. Stable faint subcentimeter nodule over the lateral left midlung unchanged. Recommend follow-up chest radiograph 6-12 months. Electronically Signed   By: Marin Olp M.D.   On: 02/01/2017 14:14   Ct Head Wo Contrast  Result Date: 02/01/2017 CLINICAL DATA:  Initial evaluation for acute altered mental status. EXAM: CT HEAD WITHOUT CONTRAST TECHNIQUE: Contiguous axial images were obtained from the base of the skull through the vertex without intravenous contrast. COMPARISON:  [facility] CT from 12/03/2015. FINDINGS: Brain: Moderate atrophy with chronic small vessel ischemic disease. Remote lacunar infarct present within the posterior limb of the left internal capsule. No  acute intracranial hemorrhage. No acute large vessel territory infarct. No mass lesion, midline shift or mass effect. No hydrocephalus. No extra-axial fluid collection. Vascular: No hyperdense vessel. Scattered vascular calcifications noted within the carotid siphons. Skull: Scalp soft tissues and calvarium within normal limits. Sinuses/Orbits: Globes and orbital soft tissues within normal limits. Patient status post lens extraction bilaterally. Paranasal sinuses are clear. Trace right mastoid effusion noted. Other: None. IMPRESSION: 1. No acute intracranial process. 2. Moderate cerebral atrophy with chronic small vessel ischemic disease. Electronically Signed   By: Jeannine Boga M.D.   On: 02/01/2017 17:36  DVT Prophylaxis -SCD /Heparin AM Labs Ordered, also please review Full Orders  Family Communication: Admission, patients condition and plan of care including tests being ordered have been discussed with the patient and sons x2, daughter in law and granddaughter who indicate understanding and agree with the plan  Code Status - Full Code  Likely DC to  Home   Condition   stable  Roxan Hockey M.D on 02/01/2017 at 9:20 PM   Between 7am to 7pm - Pager - [phone] After 7pm go to [email or web address] - password [email or web address]  [facility] - Office  [phone]  Voice Recognition Viviann Spare dictation system was used to create this note, attempts have been made to correct errors. Please contact the author with questions and/or clarifications.

## 2017-02-01 NOTE — ED Notes (Signed)
CRITICAL VALUE ALERT  Critical Value:  Trop 0.03  Date & Time Notied:  1605 02/01/17  Provider Notified: Dr. Thurnell Garbe  Orders Received/Actions taken: No new orders at this time.

## 2017-02-01 NOTE — ED Provider Notes (Signed)
[facility] EMERGENCY DEPARTMENT Provider Note   CSN: [id number] Arrival date & time: 02/01/17  1237     History   Chief Complaint Chief Complaint  Patient presents with  . Altered Mental Status    HPI [name] is a 83 y.o. female.  The history is provided by the patient and a relative. The history is limited by the condition of the patient (confusion).  Altered Mental Status      Pt was seen at 1525.  Per pt's family and pt:  Pt with gradual onset and worsening of persistent generalized weakness, poor PO intake for the past 2 weeks. Pt states she fell and hit her head 2 weeks ago, but does not recall the circumstances regarding the fall. Has been associated with "confusion" since yesterday. Pt's family states pt was "hallucinating" and "seeing a little girl that wasn't there." Pt states she "doesn't know what's going on." Denies CP/palpitations, no SOB/cough, no abd pain, no N/V/D, no neck or back pain, no focal motor weakness, no tingling/numbness in extremities, no slurred speech, no facial droop, no ataxia.   Past Medical History:  Diagnosis Date  . Adhesion of abdominal wall    "continues to have abdominal pain pain intermittent right abdominal side. "scar tissue"  . Arthritis   . Breast cyst   . Bronchitis, asthmatic    'bronchitis that will go into an asthma like attack but haven't had it in years'  . Colon polyps   . Elevated blood sugar   . Esophageal stricture    "occ. problems with swallowing water"  . Full dentures   . GI bleed   . Goiter   . History of blood transfusion   . History of hiatal hernia    repaired over 20 years ago  . ITP (idiopathic thrombocytopenic purpura)    not a problem now.  . Other and unspecified hyperlipidemia   . Peptic ulcer disease   . Rhinitis   . Unspecified essential hypertension   . Unspecified hypothyroidism     Patient Active Problem List   Diagnosis Date Noted  . Lumbar radiculopathy 03/14/2016  . HNP  (herniated nucleus pulposus), cervical 05/16/2015    Class: Chronic  . Herniated nucleus pulposus, lumbar 05/16/2015  . Abnormal EKG 04/21/2015  . Thrombocytopenia ([place]) 08/18/2014  . Insomnia 11/24/2013  . Impingement syndrome of left shoulder 09/18/2013  . S/P arthroscopy of shoulder 09/18/2013  . Low back pain 03/17/2013  . Neuropathy 03/17/2013  . Metabolic syndrome 32/67/1245  . Overweight (BMI 25.0-29.9) 03/17/2013  . Vitamin D deficiency 12/01/2012  . Chronic insomnia 12/01/2012  . Fibrocystic breast changes 05/21/2011  . Essential hypertension   . Hyperlipidemia   . Peptic ulcer disease   . ITP (idiopathic thrombocytopenic purpura)   . Rhinitis   . Elevated blood sugar   . Hypothyroidism   . Goiter   . Colon polyps   . Adhesion of abdominal wall   . Esophageal stricture     Past Surgical History:  Procedure Laterality Date  . ABDOMINAL HYSTERECTOMY    . APPENDECTOMY    . BACK SURGERY     x1 Lumbar, x2 cervical anterior and posterior .  Marland Kitchen BREAST SURGERY     x3 biopsy  . cataract surgery Bilateral   . CHOLECYSTECTOMY    . EYE SURGERY Bilateral 2007   cataracts   . HEMORRHOID SURGERY    . HERNIA REPAIR    . IR RADIOLOGIST EVAL & MGMT  12/24/2016  .  IR SACROPLASTY BILATERAL  12/28/2016  . JOINT REPLACEMENT    . LUMBAR LAMINECTOMY N/A 05/16/2015   Procedure: MICRODISCECTOMY Lumbar 3-Lumbar 4;  Surgeon: Jessy Oto, MD;  Location: [place];  Service: Orthopedics;  Laterality: N/A;  . 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 THYROID SURGERY     Total "goiter removal"  . TOTAL KNEE ARTHROPLASTY Right 2010    OB History    No data available       Home Medications    Prior to Admission medications   Medication Sig Start Date End Date Taking? Authorizing Provider  atorvastatin (LIPITOR) 80 MG tablet TAKE 1 TABLET ONCE A DAY 06/05/16   Chipper Herb, MD  calcium citrate-vitamin D (CITRACAL+D) 315-200 MG-UNIT per tablet Take 1 tablet by mouth  daily.    [provider]  Cholecalciferol (VITAMIN D3) 2000 UNITS TABS Take 1 tablet by mouth daily.      [provider]  fluticasone (FLONASE) 50 MCG/ACT nasal spray Place 2 sprays into both nostrils daily. 08/09/16   Chipper Herb, MD  HYDROcodone-acetaminophen (NORCO/VICODIN) 5-325 MG tablet Take 1 tablet by mouth every 6 (six) hours as needed for moderate pain. Take 1/2 tablet by mouth every 12 hours when necessary for pain. 11/28/16   Jessy Oto, MD  levothyroxine (SYNTHROID, LEVOTHROID) 150 MCG tablet TAKE 1 TABLET DAILY 08/14/16   Chipper Herb, MD  meclizine (ANTIVERT) 25 MG tablet TAKE 1/2 TABLET 3 TIMES A DAY AS NEEDED FOR DIZZINESS 01/04/17   Chipper Herb, MD  Multiple Vitamin (MULTIVITAMIN WITH MINERALS) TABS tablet Take 1 tablet by mouth daily.    [provider]  polyvinyl alcohol (LIQUIFILM TEARS) 1.4 % ophthalmic solution Place 1 drop into both eyes as needed for dry eyes.    [provider]  potassium chloride (K-DUR) 10 MEQ tablet Take 1 tablet (10 mEq total) by mouth daily. 04/27/16   Chipper Herb, MD  traZODone (DESYREL) 50 MG tablet TAKE UP TO 2 TABLETS AT BEDTIME FOR SLEEP 01/10/17   Chipper Herb, MD  valsartan-hydrochlorothiazide (DIOVAN-HCT) 320-25 MG tablet TAKE 1 TABLET DAILY 06/05/16   Chipper Herb, MD    Family History Family History  Problem Relation Age of Onset  . Lung cancer Father   . Heart disease Mother        Unkown.  Died age 10  . Emphysema Sister     Social History Social History   Tobacco Use  . Smoking status: Passive Smoke Exposure - Never Smoker  . Smokeless tobacco: Never Used  Substance Use Topics  . Alcohol use: No  . Drug use: No     Allergies   Prednisone   Review of Systems Review of Systems  Unable to perform ROS: Mental status change     Physical Exam Updated Vital Signs BP (!) 124/51   Pulse 84   Temp (!) 97.5 F (36.4 C) (Oral)   Resp 19   SpO2 100%    Patient  Vitals for the past 24 hrs:  BP Temp Temp src Pulse Resp SpO2  02/01/17 1736 (!) 147/69 - - (!) 59 18 99 %  02/01/17 1700 117/82 - - (!) 59 19 96 %  02/01/17 1630 (!) 119/96 - - (!) 58 15 100 %  02/01/17 1600 (!) 138/53 - - 61 17 100 %  02/01/17 1539 (!) 124/51 - - 84 19 100 %  02/01/17 1331 104/72 - - - - -  02/01/17 1329 - (!) 97.5 F (36.4 C) Oral 72 18 93 %   Orthostatic VS - Lying from 01/31/17 1812 to 02/01/17 1812   Date/Time  02/01/17 1549  BP- Lying: 118/53  Pulse- Lying: 62  User: KRS   Orthostatic VS - Sitting from 01/31/17 1812 to 02/01/17 1812   Date/Time BP- Sitting  02/01/17 1549 114/60  Pulse- Sitting: 65  Who: KRS   Orthostatic VS - Standing from 01/31/17 1812 to 02/01/17 1812   Date/Time BP- Standing at 0 minutes  02/01/17 1549 104/61  Pulse- Standing at 0 minutes: 73  Who: KRS         Physical Exam 1530: Physical examination:  Nursing notes reviewed; Vital signs and O2 SAT reviewed;  Constitutional: Well developed, Well nourished, In no acute distress; Head:  Normocephalic, atraumatic; Eyes: EOMI, PERRL, No scleral icterus; ENMT: Mouth and pharynx normal, Mucous membranes dry; Neck: Supple, Full range of motion, No lymphadenopathy; Cardiovascular: Regular rate and rhythm, No gallop; Respiratory: Breath sounds clear & equal bilaterally, No wheezes.  Speaking full sentences with ease, Normal respiratory effort/excursion; Chest: Nontender, Movement normal; Abdomen: Soft, Nontender, Nondistended, Normal bowel sounds; Genitourinary: No CVA tenderness; Extremities: Pulses normal, No tenderness, No edema, No calf edema or asymmetry.; Neuro: Awake, alert, confused re: time, events. Major CN grossly intact. Speech clear.  No facial droop. Grips equal. Strength 5/5 equal bilat UE's and LE's.  DTR 2/4 equal bilat UE's and LE's.  No gross sensory deficits.  Normal cerebellar testing bilat UE's (finger-nose) and LE's (heel-shin)..; Skin: Color normal, Warm, Dry.   ED  Treatments / Results  Labs (all labs ordered are listed, but only abnormal results are displayed)   EKG  EKG Interpretation  Date/Time:  Friday February 01 2017 13:35:39 EST Ventricular Rate:  73 PR Interval:  180 QRS Duration: 142 QT Interval:  426 QTC Calculation: 469 R Axis:   -75 Text Interpretation:  Normal sinus rhythm Left axis deviation Right bundle branch block Left anterior fascicular block  Bifascicular block  Septal infarct , age undetermined When compared with ECG of 01/05/2017 No significant change was found Confirmed by Francine Graven [phone]) on 02/01/2017 3:42:50 PM       Radiology   Procedures Procedures (including critical care time)  Medications Ordered in ED Medications - No data to display   Initial Impression / Assessment and Plan / ED Course  I have reviewed the triage vital signs and the nursing notes.  Pertinent labs & imaging results that were available during my care of the patient were reviewed by me and considered in my medical decision making (see chart for details).  MDM Reviewed: previous chart, nursing note and vitals Reviewed previous: labs and ECG Interpretation: labs, ECG, x-ray and CT scan    Results for orders placed or performed during the hospital encounter of 02/01/17  Comprehensive metabolic panel  Result Value Ref Range   Sodium 137 135 - 145 mmol/L   Potassium 3.0 (L) 3.5 - 5.1 mmol/L   Chloride 102 101 - 111 mmol/L   CO2 27 22 - 32 mmol/L   Glucose, Bld 116 (H) 65 - 99 mg/dL   BUN 37 (H) 6 - 20 mg/dL   Creatinine, Ser 2.26 (H) 0.44 - 1.00 mg/dL   Calcium 9.7 8.9 - 10.3 mg/dL   Total Protein 6.9 6.5 - 8.1 g/dL   Albumin 3.1 (L) 3.5 - 5.0 g/dL   AST 30 15 - 41 U/L   ALT 16 14 - 54  U/L   Alkaline Phosphatase 111 38 - 126 U/L   Total Bilirubin 0.6 0.3 - 1.2 mg/dL   GFR calc non Af Amer 18 (L) >60 mL/min   GFR calc Af Amer 21 (L) >60 mL/min   Anion gap 8 5 - 15  CBC with Differential  Result Value Ref Range    WBC 17.2 (H) 4.0 - 10.5 K/uL   RBC 3.72 (L) 3.87 - 5.11 MIL/uL   Hemoglobin 11.4 (L) 12.0 - 15.0 g/dL   HCT 36.6 36.0 - 46.0 %   MCV 98.4 78.0 - 100.0 fL   MCH 30.6 26.0 - 34.0 pg   MCHC 31.1 30.0 - 36.0 g/dL   RDW 12.9 11.5 - 15.5 %   Platelets 133 (L) 150 - 400 K/uL   Neutrophils Relative % 77 %   Neutro Abs 13.2 (H) 1.7 - 7.7 K/uL   Lymphocytes Relative 18 %   Lymphs Abs 3.2 0.7 - 4.0 K/uL   Monocytes Relative 3 %   Monocytes Absolute 0.5 0.1 - 1.0 K/uL   Eosinophils Relative 2 %   Eosinophils Absolute 0.4 0.0 - 0.7 K/uL   Basophils Relative 0 %   Basophils Absolute 0.0 0.0 - 0.1 K/uL  Urinalysis, Routine w reflex microscopic  Result Value Ref Range   Color, Urine YELLOW YELLOW   APPearance HAZY (A) CLEAR   Specific Gravity, Urine 1.009 1.005 - 1.030   pH 5.0 5.0 - 8.0   Glucose, UA NEGATIVE NEGATIVE mg/dL   Hgb urine dipstick NEGATIVE NEGATIVE   Bilirubin Urine NEGATIVE NEGATIVE   Ketones, ur NEGATIVE NEGATIVE mg/dL   Protein, ur NEGATIVE NEGATIVE mg/dL   Nitrite NEGATIVE NEGATIVE   Leukocytes, UA SMALL (A) NEGATIVE   RBC / HPF 0-5 0 - 5 RBC/hpf   WBC, UA 6-30 0 - 5 WBC/hpf   Bacteria, UA FEW (A) NONE SEEN   Squamous Epithelial / LPF 0-5 (A) NONE SEEN   Hyaline Casts, UA PRESENT   Troponin I  Result Value Ref Range   Troponin I 0.03 (HH) <0.03 ng/mL  CBG monitoring, ED  Result Value Ref Range   Glucose-Capillary 108 (H) 65 - 99 mg/dL   Dg Chest 2 View Result Date: 02/01/2017 CLINICAL DATA:  Altered mental status. Fatigue and loss of energy a few weeks. EXAM: CHEST  2 VIEW COMPARISON:  05/11/2015 FINDINGS: Lungs are adequately inflated without focal consolidation or effusion. Faint subcentimeter nodular density over the lateral left midlung unchanged. Mild emphysematous disease. Cardiomediastinal silhouette is within normal. There is calcified plaque over the aortic arch. Mild degenerate change of the spine. IMPRESSION: No acute cardiopulmonary disease. Mild  emphysematous disease. Stable faint subcentimeter nodule over the lateral left midlung unchanged. Recommend follow-up chest radiograph 6-12 months. Electronically Signed   By: Marin Olp M.D.   On: 02/01/2017 14:14   Ct Head Wo Contrast Result Date: 02/01/2017 CLINICAL DATA:  Initial evaluation for acute altered mental status. EXAM: CT HEAD WITHOUT CONTRAST TECHNIQUE: Contiguous axial images were obtained from the base of the skull through the vertex without intravenous contrast. COMPARISON:  [facility] CT from 12/03/2015. FINDINGS: Brain: Moderate atrophy with chronic small vessel ischemic disease. Remote lacunar infarct present within the posterior limb of the left internal capsule. No acute intracranial hemorrhage. No acute large vessel territory infarct. No mass lesion, midline shift or mass effect. No hydrocephalus. No extra-axial fluid collection. Vascular: No hyperdense vessel. Scattered vascular calcifications noted within the carotid siphons. Skull: Scalp soft tissues and calvarium within  normal limits. Sinuses/Orbits: Globes and orbital soft tissues within normal limits. Patient status post lens extraction bilaterally. Paranasal sinuses are clear. Trace right mastoid effusion noted. Other: None. IMPRESSION: 1. No acute intracranial process. 2. Moderate cerebral atrophy with chronic small vessel ischemic disease. Electronically Signed   By: Jeannine Boga M.D.   On: 02/01/2017 17:36    Results for [name], [name] (MRN [id number]) as of 02/01/2017 18:00  Ref. Range 12/13/2016 11:06 12/28/2016 09:25 01/05/2017 17:30 02/01/2017 14:23  BUN Latest Ref Range: 6 - 20 mg/dL 8 14 17  37 (H)  Creatinine Latest Ref Range: 0.44 - 1.00 mg/dL 0.89 1.30 (H) 0.98 2.26 (H)     1810:  Pt orthostatic on VS with new BUN/Cr elevation; judicious IVF given. +UTI, UC pending; will dose IV rocephin.  Pt's family states pt's "WBC count is always elevated since they took out her spleen."  Remains afebrile with  stable VS while in the ED. Pt is pleasantly confused, denies complaints. T/C to [facility]. Denton Brick, case discussed, including:  HPI, pertinent PM/SHx, VS/PE, dx testing, ED course and treatment:  Agreeable to admit.     Final Clinical Impressions(s) / ED Diagnoses   Final diagnoses:  None    ED Discharge Orders    None        Francine Graven, DO 02/06/17 1832

## 2017-02-01 NOTE — ED Triage Notes (Signed)
family states pt is hallucinating. Pt fell 2 weeks ago and hit head. Pt is alert. States she doesn't know whats going on.

## 2017-02-01 NOTE — ED Notes (Signed)
Pt moving all extremites. No slurred speech noted.

## 2017-02-02 DIAGNOSIS — N179 Acute kidney failure, unspecified: Secondary | ICD-10-CM | POA: Diagnosis not present

## 2017-02-02 LAB — BASIC METABOLIC PANEL WITH GFR
Anion gap: 7 (ref 5–15)
BUN: 32 mg/dL — ABNORMAL HIGH (ref 6–20)
CO2: 24 mmol/L (ref 22–32)
Calcium: 8.5 mg/dL — ABNORMAL LOW (ref 8.9–10.3)
Chloride: 108 mmol/L (ref 101–111)
Creatinine, Ser: 1.48 mg/dL — ABNORMAL HIGH (ref 0.44–1.00)
GFR calc Af Amer: 35 mL/min — ABNORMAL LOW
GFR calc non Af Amer: 30 mL/min — ABNORMAL LOW
Glucose, Bld: 92 mg/dL (ref 65–99)
Potassium: 3.8 mmol/L (ref 3.5–5.1)
Sodium: 139 mmol/L (ref 135–145)

## 2017-02-02 LAB — CBC
HCT: 32.5 % — ABNORMAL LOW (ref 36.0–46.0)
Hemoglobin: 10.1 g/dL — ABNORMAL LOW (ref 12.0–15.0)
MCH: 30.6 pg (ref 26.0–34.0)
MCHC: 31.1 g/dL (ref 30.0–36.0)
MCV: 98.5 fL (ref 78.0–100.0)
Platelets: 112 K/uL — ABNORMAL LOW (ref 150–400)
RBC: 3.3 MIL/uL — ABNORMAL LOW (ref 3.87–5.11)
RDW: 13 % (ref 11.5–15.5)
WBC: 14.1 K/uL — ABNORMAL HIGH (ref 4.0–10.5)

## 2017-02-02 NOTE — Progress Notes (Signed)
PROGRESS NOTE    [name]  FFM:[id number] DOB: January 04, 1926 DOA: 02/01/2017 PCP: Chipper Herb, MD     Brief Narrative:  83 year old woman admitted from home on 11/23 due to confusion, hallucinations for the past 5-6 weeks that worsened overnight.  Found to have a UTI and acute renal failure and admission was requested.   Assessment & Plan:   Principal Problem:   AKI (acute kidney injury) ([place]) Active Problems:   Essential hypertension   Hypothyroidism   Confusion and disorientation   UTI (urinary tract infection)   Acute renal failure -Improving with IV fluids, -Creatinine down to 1.4 from greater than 2 on admission. -Suspect due to dehydration, prerenal azotemia in addition to ARB diuretic use.  UTI -Continue Rocephin pending culture data.  Acute metabolic encephalopathy -Likely on top of baseline dementia given length of symptoms. -Acute component could certainly be related to UTI and acute renal failure, however suspect given this is been going on for greater than 6 weeks that she has some degree of baseline cognitive impairment. -He already has follow-up with neurology scheduled for this.  Hypothyroidism -6 weeks ago her TSH was 161, repeat TSH now is 5.70, this could certainly be contributing to her cognitive deficits. -Continue current Synthroid dose.   DVT prophylaxis: SCDs Code Status: full code Family Communication: Daughter and son at bedside updated on plan of care and all questions answered Disposition Plan: Home when ready  Consultants:   None  Procedures:   None  Antimicrobials:  Anti-infectives (From admission, onward)   Start     Dose/Rate Route Frequency Ordered Stop   02/02/17 1800  cefTRIAXone (ROCEPHIN) 1 g in dextrose 5 % 50 mL IVPB     1 g 100 mL/hr over 30 Minutes Intravenous Every 24 hours 02/01/17 1842     02/01/17 1800  cefTRIAXone (ROCEPHIN) 1 g in dextrose 5 % 50 mL IVPB     1 g 100 mL/hr over 30 Minutes Intravenous   Once 02/01/17 1759 02/01/17 1917       Subjective: Feels well, has no current complaints, anxious for discharge home  Objective: Vitals:   02/01/17 1830 02/01/17 2100 02/02/17 0436 02/02/17 1300  BP: (!) 135/59 (!) 149/55 98/60 129/68  Pulse: (!) 56 (!) 59 71 67  Resp: 15 16 16 19   Temp:  98 F (36.7 C) 97.7 F (36.5 C) 98 F (36.7 C)  TempSrc:  Oral Oral Oral  SpO2: 94% 97% 98% 97%  Weight:  60.1 kg (132 lb 7.9 oz)    Height:  5\' 5"  (1.651 m)      Intake/Output Summary (Last 24 hours) at 02/02/2017 1510 Last data filed at 02/02/2017 1300 Gross per 24 hour  Intake 1440 ml  Output 1050 ml  Net 390 ml   Filed Weights   02/01/17 2100  Weight: 60.1 kg (132 lb 7.9 oz)    Examination:  General exam: Alert, awake, oriented x 3 Respiratory system: Clear to auscultation. Respiratory effort normal. Cardiovascular system:RRR. No murmurs, rubs, gallops. Gastrointestinal system: Abdomen is nondistended, soft and nontender. No organomegaly or masses felt. Normal bowel sounds heard. Central nervous system: Alert and oriented. No focal neurological deficits. Extremities: No C/C/E, +pedal pulses Skin: No rashes, lesions or ulcers Psychiatry: Judgement and insight appear normal. Mood & affect appropriate.     Data Reviewed: I have personally reviewed following labs and imaging studies  CBC: Recent Labs  Lab 02/01/17 1423 02/02/17 0609  WBC 17.2* 14.1*  NEUTROABS 13.2*  --  HGB 11.4* 10.1*  HCT 36.6 32.5*  MCV 98.4 98.5  PLT 133* 174*   Basic Metabolic Panel: Recent Labs  Lab 02/01/17 1423 02/02/17 0609  NA 137 139  K 3.0* 3.8  CL 102 108  CO2 27 24  GLUCOSE 116* 92  BUN 37* 32*  CREATININE 2.26* 1.48*  CALCIUM 9.7 8.5*   GFR: Estimated Creatinine Clearance: 23.2 mL/min (A) (by C-G formula based on SCr of 1.48 mg/dL (H)). Liver Function Tests: Recent Labs  Lab 02/01/17 1423  AST 30  ALT 16  ALKPHOS 111  BILITOT 0.6  PROT 6.9  ALBUMIN 3.1*   No  results for input(s): LIPASE, AMYLASE in the last 168 hours. No results for input(s): AMMONIA in the last 168 hours. Coagulation Profile: No results for input(s): INR, PROTIME in the last 168 hours. Cardiac Enzymes: Recent Labs  Lab 02/01/17 1423  TROPONINI 0.03*   BNP (last 3 results) No results for input(s): PROBNP in the last 8760 hours. HbA1C: No results for input(s): HGBA1C in the last 72 hours. CBG: Recent Labs  Lab 02/01/17 1335  GLUCAP 108*   Lipid Profile: No results for input(s): CHOL, HDL, LDLCALC, TRIG, CHOLHDL, LDLDIRECT in the last 72 hours. Thyroid Function Tests: Recent Labs    02/01/17 1423  TSH 0.852   Anemia Panel: No results for input(s): VITAMINB12, FOLATE, FERRITIN, TIBC, IRON, RETICCTPCT in the last 72 hours. Urine analysis:    Component Value Date/Time   COLORURINE YELLOW 02/01/2017 1540   APPEARANCEUR HAZY (A) 02/01/2017 1540   LABSPEC 1.009 02/01/2017 1540   PHURINE 5.0 02/01/2017 1540   GLUCOSEU NEGATIVE 02/01/2017 1540   HGBUR NEGATIVE 02/01/2017 1540   BILIRUBINUR NEGATIVE 02/01/2017 1540   BILIRUBINUR neg 05/22/2013 1249   KETONESUR NEGATIVE 02/01/2017 1540   PROTEINUR NEGATIVE 02/01/2017 1540   UROBILINOGEN negative 05/22/2013 1249   UROBILINOGEN 0.2 12/28/2006 0933   NITRITE NEGATIVE 02/01/2017 1540   LEUKOCYTESUR SMALL (A) 02/01/2017 1540   Sepsis Labs: @LABRCNTIP (procalcitonin:4,lacticidven:4)  ) Recent Results (from the past 240 hour(s))  Culture, blood (Routine X 2) w Reflex to ID Panel     Status: None (Preliminary result)   Collection Time: 02/01/17  7:15 PM  Result Value Ref Range Status   Specimen Description LEFT ANTECUBITAL  Final   Special Requests   Final    BOTTLES DRAWN AEROBIC AND ANAEROBIC Blood Culture results may not be optimal due to an inadequate volume of blood received in culture bottles   Culture NO GROWTH < 12 HOURS  Final   Report Status PENDING  Incomplete  Culture, blood (Routine X 2) w Reflex to  ID Panel     Status: None (Preliminary result)   Collection Time: 02/01/17  7:15 PM  Result Value Ref Range Status   Specimen Description BLOOD RIGHT HAND  Final   Special Requests   Final    BOTTLES DRAWN AEROBIC AND ANAEROBIC Blood Culture results may not be optimal due to an inadequate volume of blood received in culture bottles   Culture NO GROWTH < 12 HOURS  Final   Report Status PENDING  Incomplete         Radiology Studies: Dg Chest 2 View  Result Date: 02/01/2017 CLINICAL DATA:  Altered mental status. Fatigue and loss of energy a few weeks. EXAM: CHEST  2 VIEW COMPARISON:  05/11/2015 FINDINGS: Lungs are adequately inflated without focal consolidation or effusion. Faint subcentimeter nodular density over the lateral left midlung unchanged. Mild emphysematous disease. Cardiomediastinal silhouette is  within normal. There is calcified plaque over the aortic arch. Mild degenerate change of the spine. IMPRESSION: No acute cardiopulmonary disease. Mild emphysematous disease. Stable faint subcentimeter nodule over the lateral left midlung unchanged. Recommend follow-up chest radiograph 6-12 months. Electronically Signed   By: Marin Olp M.D.   On: 02/01/2017 14:14   Ct Head Wo Contrast  Result Date: 02/01/2017 CLINICAL DATA:  Initial evaluation for acute altered mental status. EXAM: CT HEAD WITHOUT CONTRAST TECHNIQUE: Contiguous axial images were obtained from the base of the skull through the vertex without intravenous contrast. COMPARISON:  [facility] CT from 12/03/2015. FINDINGS: Brain: Moderate atrophy with chronic small vessel ischemic disease. Remote lacunar infarct present within the posterior limb of the left internal capsule. No acute intracranial hemorrhage. No acute large vessel territory infarct. No mass lesion, midline shift or mass effect. No hydrocephalus. No extra-axial fluid collection. Vascular: No hyperdense vessel. Scattered vascular calcifications noted within the carotid  siphons. Skull: Scalp soft tissues and calvarium within normal limits. Sinuses/Orbits: Globes and orbital soft tissues within normal limits. Patient status post lens extraction bilaterally. Paranasal sinuses are clear. Trace right mastoid effusion noted. Other: None. IMPRESSION: 1. No acute intracranial process. 2. Moderate cerebral atrophy with chronic small vessel ischemic disease. Electronically Signed   By: Jeannine Boga M.D.   On: 02/01/2017 17:36        Scheduled Meds: . atorvastatin  80 mg Oral q1800  . calcitonin (salmon)  1 spray Alternating Nares Daily  . calcium-vitamin D  1 tablet Oral Daily  . cholecalciferol  2,000 Units Oral Daily  . DULoxetine  60 mg Oral q morning - 10a  . feeding supplement (ENSURE ENLIVE)  237 mL Oral BID BM  . fluticasone  2 spray Each Nare Daily  . heparin  5,000 Units Subcutaneous Q8H  . levothyroxine  125 mcg Oral QAC breakfast  . multivitamin with minerals  1 tablet Oral Daily  . potassium chloride  10 mEq Oral Daily  . senna  1 tablet Oral BID  . sodium chloride flush  3 mL Intravenous Q12H  . traZODone  100 mg Oral QHS   Continuous Infusions: . sodium chloride    . sodium chloride 100 mL/hr at 02/02/17 0647  . cefTRIAXone (ROCEPHIN)  IV       LOS: 0 days    Time spent: 25 minutes. Greater than 50% of this time was spent in direct contact with the patient coordinating care.     Lelon Frohlich, MD [facility] Pager [phone]  If 7PM-7AM, please contact night-coverage [email or web address] Password [email or web address] 02/02/2017, 3:10 PM

## 2017-02-02 NOTE — Progress Notes (Signed)
Initial Nutrition Assessment  DOCUMENTATION CODES:  Not applicable  INTERVENTION:  Continue Ensure Enlive po BID, each supplement provides 350 kcal and 20 grams of protein  Monitor PO intake, will follow up as indicated.   NUTRITION DIAGNOSIS:  Inadequate oral intake related to lethargy/confusion, acute illness as evidenced by per patient/family report.  GOAL:  Patient will meet greater than or equal to 90% of their needs  MONITOR:  PO intake, Supplement acceptance, Labs, Weight trends  REASON FOR ASSESSMENT:  Malnutrition Screening Tool    ASSESSMENT:  83 y/o female PMHx PUD, HTN/HLD, esophageal structure. Brought to ED due to hallucinating and AMS. Work up revealed AKI, thought related to poor po intake/HCTZ use and presumed UTI. Admitted for management.   RD operating remotely on weekends.   Minimal information in chart at this time.   Per H&P patients confusion/cognitive impairment began more than 5 weeks ago. She had been presented tot the ED one month ago with similar Dx of confusion. She had an outpatient appointment placed to neurology at that time to evaluate for possible cognitive deficits/dementia.   MST indicates the patient has had unintentional weight loss and poor intake due to a poor appetite, likely in setting of AMS. Her charted weights appear to all have been reported, not measured. As such, difficult determine how much weight she has lost recently.Likely it is her UBW that is 144 or 151 lbs.  Her current weight is significantly lower, but she may have been losing weight more gradually.   There is no documented PO intake at this time. Continue ENsure and monitor meal intakes. Can follow up if still admitted next week.   Physical Exam: Unable to determine  Meds: Oscal w/ D,. Ensure, Vit D, KCL, MVI with min, Senna, IVF, IV abx Labs: WBC: 14.1, bun/creat: 32/1.48, Albumin 3.1,   Recent Labs  Lab 02/01/17 1423 02/02/17 0609  NA 137 139  K 3.0* 3.8  CL  102 108  CO2 27 24  BUN 37* 32*  CREATININE 2.26* 1.48*  CALCIUM 9.7 8.5*  GLUCOSE 116* 92   NUTRITION - FOCUSED PHYSICAL EXAM: Unable to assess at this time.   Diet Order:  Diet Heart Room service appropriate? Yes; Fluid consistency: Thin  EDUCATION NEEDS:  No education needs have been identified at this time  Skin:  Skin Assessment: Reviewed RN Assessment  Last BM:  Unknown  Height:  Ht Readings from Last 1 Encounters:  02/01/17 5\' 5"  (1.651 m)   Weight:  Wt Readings from Last 1 Encounters:  02/01/17 132 lb 7.9 oz (60.1 kg)   Wt Readings from Last 10 Encounters:  02/01/17 132 lb 7.9 oz (60.1 kg)  01/05/17 151 lb (68.5 kg)  12/13/16 151 lb (68.5 kg)  11/28/16 144 lb (65.3 kg)  11/07/16 144 lb (65.3 kg)  09/06/16 144 lb (65.3 kg)  08/23/16 144 lb (65.3 kg)  08/09/16 154 lb (69.9 kg)  05/24/16 144 lb (65.3 kg)  04/11/16 144 lb (65.3 kg)   Ideal Body Weight:  56.82 kg  BMI:  Body mass index is 22.05 kg/m.  Estimated Nutritional Needs:  Kcal:  1550-1750 (26-29 kcal/kg bw) Protein:  72-84g Pro (1.2-1.4 g/kg bw) Fluid:  >1.5 L (25 ml/kg bw)  Burtis Junes RD, LDN, CNSC Clinical Nutrition Pager: [phone] 02/02/2017 12:11 PM

## 2017-02-02 NOTE — Plan of Care (Signed)
Pt is progressing 

## 2017-02-03 DIAGNOSIS — N179 Acute kidney failure, unspecified: Secondary | ICD-10-CM | POA: Diagnosis not present

## 2017-02-03 LAB — CBC
HEMATOCRIT: 28.5 % — AB (ref 36.0–46.0)
Hemoglobin: 8.8 g/dL — ABNORMAL LOW (ref 12.0–15.0)
MCH: 30.8 pg (ref 26.0–34.0)
MCHC: 30.9 g/dL (ref 30.0–36.0)
MCV: 99.7 fL (ref 78.0–100.0)
PLATELETS: 114 10*3/uL — AB (ref 150–400)
RBC: 2.86 MIL/uL — ABNORMAL LOW (ref 3.87–5.11)
RDW: 13.1 % (ref 11.5–15.5)
WBC: 15.3 10*3/uL — AB (ref 4.0–10.5)

## 2017-02-03 LAB — BASIC METABOLIC PANEL
Anion gap: 4 — ABNORMAL LOW (ref 5–15)
BUN: 24 mg/dL — AB (ref 6–20)
CALCIUM: 8.3 mg/dL — AB (ref 8.9–10.3)
CO2: 24 mmol/L (ref 22–32)
Chloride: 112 mmol/L — ABNORMAL HIGH (ref 101–111)
Creatinine, Ser: 0.92 mg/dL (ref 0.44–1.00)
GFR calc Af Amer: >60 mL/min (ref >60)
GFR, EST NON AFRICAN AMERICAN: 54 mL/min — AB (ref >60)
GLUCOSE: 94 mg/dL (ref 65–99)
POTASSIUM: 3.6 mmol/L (ref 3.5–5.1)
SODIUM: 140 mmol/L (ref 135–145)

## 2017-02-03 LAB — URINE CULTURE

## 2017-02-03 LAB — RPR: RPR Ser Ql: NONREACTIVE

## 2017-02-03 MED ORDER — CIPROFLOXACIN HCL 250 MG PO TABS: 250.0000 mg | ORAL_TABLET | Freq: Two times a day (BID) | ORAL | 0 refills | Status: DC

## 2017-02-03 MED ORDER — LEVOTHYROXINE SODIUM 125 MCG PO TABS: 125.0000 ug | ORAL_TABLET | Freq: Every day | ORAL | Status: DC

## 2017-02-03 NOTE — Discharge Summary (Signed)
Physician Discharge Summary  [name] TFT:[id number] DOB: [birth year] DOA: 02/01/2017  PCP: Chipper Herb, MD  Admit date: 02/01/2017 Discharge date: 02/03/2017  Time spent: 45 minutes  Recommendations for Outpatient Follow-up:  -To be discharged home today. -We will need to complete 5 days of Cipro for UTI. -Advised to follow-up with PCP in 2 weeks. -Home health services will be arranged.  Discharge Diagnoses:  Principal Problem:   AKI (acute kidney injury) ([place]) Active Problems:   Essential hypertension   Hypothyroidism   Confusion and disorientation   UTI (urinary tract infection)   Discharge Condition: Stable and improved  Filed Weights   02/01/17 2100  Weight: 60.1 kg (132 lb 7.9 oz)    History of present illness:  As per Dr. Denton Brick on 11/23: [name]  is a 83 y.o. female, with PMHx relevant for Hypothyroidism, HTN and osteopenia who presents with worsening confusion, falls, hallucinations and disorientation.   Additional history obtained from family members at bedside (sons x2, daughter in law and granddaughter), patient apparently has had cognitive deficits with behavior disturbance and hallucinations/delusions with disorientation at least for the last 5-6 weeks. Patient already has outpatient appointment with neurology for January 2019 to evaluate for possible cognitive deficits and Dementia.    In the ED today patient is found to have AKI and UTI which probably worsened her encephalopathy   No f/c, no emesis no diarrhea, no chest pains no palpitations  Patient had a fall a couple of weeks ago, CT head today without acute findings     Hospital Course:   Acute renal failure -Resolved with IV fluids -Creatinine down to 0.92 from greater than 2 on admission. -Suspect due to dehydration, prerenal azotemia in addition to ARB diuretic use. -We will keep off ARB for now.  UTI -Culture remains negative, will transition over to to  complete 5 more days of treatment.  Acute metabolic encephalopathy -Likely on top of baseline dementia with delirium given length of symptoms. -Acute component could certainly be related to UTI and acute renal failure, however suspect given this is been going on for greater than 6 weeks that she has some degree of baseline cognitive impairment. -He already has follow-up with neurology scheduled for this. -Per family is presently at base time, she has not any behavioral disturbances this hospitalization.  Hypothyroidism -6 weeks ago her TSH was 161, repeat TSH now is 7.06, this could certainly be contributing to her cognitive deficits. -Continue current Synthroid dose.    Procedures:  None   Consultations:  None  Discharge Instructions  Discharge Instructions    Diet - low sodium heart healthy   Complete by:  As directed    Increase activity slowly   Complete by:  As directed      Allergies as of 02/03/2017      Reactions   Prednisone Other (See Comments)   Bloated. Unable to determine if intolerance with water retention, or Possible Allergic response with swelling.      Medication List    STOP taking these medications   HYDROcodone-acetaminophen 5-325 MG tablet Commonly known as:  NORCO/VICODIN   valsartan-hydrochlorothiazide 320-25 MG tablet Commonly known as:  DIOVAN-HCT     TAKE these medications   atorvastatin 80 MG tablet Commonly known as:  LIPITOR TAKE 1 TABLET ONCE A DAY   calcium citrate-vitamin D 315-200 MG-UNIT tablet Commonly known as:  CITRACAL+D Take 1 tablet by mouth daily.   ciprofloxacin 250 MG tablet Commonly known as:  CIPRO Take 1 tablet (250 mg total) by mouth 2 (two) times daily.   DULoxetine 60 MG capsule Commonly known as:  CYMBALTA Take 60 mg by mouth every morning.   fluticasone 50 MCG/ACT nasal spray Commonly known as:  FLONASE Place 2 sprays into both nostrils daily.   levothyroxine 125 MCG tablet Commonly known as:   SYNTHROID, LEVOTHROID Take 1 tablet (125 mcg total) by mouth daily before breakfast. Start taking on:  02/04/2017 What changed:    medication strength  how much to take  when to take this   meclizine 25 MG tablet Commonly known as:  ANTIVERT TAKE 1/2 TABLET 3 TIMES A DAY AS NEEDED FOR DIZZINESS   multivitamin with minerals Tabs tablet Take 1 tablet by mouth daily.   polyvinyl alcohol 1.4 % ophthalmic solution Commonly known as:  LIQUIFILM TEARS Place 1 drop into both eyes as needed for dry eyes.   potassium chloride 10 MEQ tablet Commonly known as:  K-DUR Take 1 tablet (10 mEq total) by mouth daily.   traZODone 50 MG tablet Commonly known as:  DESYREL TAKE UP TO 2 TABLETS AT BEDTIME FOR SLEEP   Vitamin D3 2000 units Tabs Take 1 tablet by mouth daily.      Allergies  Allergen Reactions  . Prednisone Other (See Comments)    Bloated. Unable to determine if intolerance with water retention, or Possible Allergic response with swelling.   Follow-up Information    Chipper Herb, MD. Schedule an appointment as soon as possible for a visit in 2 week(s).   Specialty:  Family Medicine Contact information: [place] [address] [phone]            The results of significant diagnostics from this hospitalization (including imaging, microbiology, ancillary and laboratory) are listed below for reference.    Significant Diagnostic Studies: Dg Chest 2 View  Result Date: 02/01/2017 CLINICAL DATA:  Altered mental status. Fatigue and loss of energy a few weeks. EXAM: CHEST  2 VIEW COMPARISON:  05/11/2015 FINDINGS: Lungs are adequately inflated without focal consolidation or effusion. Faint subcentimeter nodular density over the lateral left midlung unchanged. Mild emphysematous disease. Cardiomediastinal silhouette is within normal. There is calcified plaque over the aortic arch. Mild degenerate change of the spine. IMPRESSION: No acute cardiopulmonary  disease. Mild emphysematous disease. Stable faint subcentimeter nodule over the lateral left midlung unchanged. Recommend follow-up chest radiograph 6-12 months. Electronically Signed   By: Marin Olp M.D.   On: 02/01/2017 14:14   Ct Head Wo Contrast  Result Date: 02/01/2017 CLINICAL DATA:  Initial evaluation for acute altered mental status. EXAM: CT HEAD WITHOUT CONTRAST TECHNIQUE: Contiguous axial images were obtained from the base of the skull through the vertex without intravenous contrast. COMPARISON:  [facility] CT from 12/03/2015. FINDINGS: Brain: Moderate atrophy with chronic small vessel ischemic disease. Remote lacunar infarct present within the posterior limb of the left internal capsule. No acute intracranial hemorrhage. No acute large vessel territory infarct. No mass lesion, midline shift or mass effect. No hydrocephalus. No extra-axial fluid collection. Vascular: No hyperdense vessel. Scattered vascular calcifications noted within the carotid siphons. Skull: Scalp soft tissues and calvarium within normal limits. Sinuses/Orbits: Globes and orbital soft tissues within normal limits. Patient status post lens extraction bilaterally. Paranasal sinuses are clear. Trace right mastoid effusion noted. Other: None. IMPRESSION: 1. No acute intracranial process. 2. Moderate cerebral atrophy with chronic small vessel ischemic disease. Electronically Signed   By: Pincus Badder.D.  On: 02/01/2017 17:36    Microbiology: Recent Results (from the past 240 hour(s))  Urine culture     Status: Abnormal   Collection Time: 02/01/17  3:40 PM  Result Value Ref Range Status   Specimen Description URINE, CLEAN CATCH  Final   Special Requests NONE  Final   Culture (A)  Final    <10,000 COLONIES/mL Performed at [facility], [place] [address], [place], [place] [address]    Report Status 02/03/2017 FINAL  Final  Culture, blood (Routine X 2) w Reflex to ID Panel     Status: None (Preliminary result)     Collection Time: 02/01/17  7:15 PM  Result Value Ref Range Status   Specimen Description LEFT ANTECUBITAL  Final   Special Requests   Final    BOTTLES DRAWN AEROBIC AND ANAEROBIC Blood Culture results may not be optimal due to an inadequate volume of blood received in culture bottles   Culture NO GROWTH 2 DAYS  Final   Report Status PENDING  Incomplete  Culture, blood (Routine X 2) w Reflex to ID Panel     Status: None (Preliminary result)   Collection Time: 02/01/17  7:15 PM  Result Value Ref Range Status   Specimen Description BLOOD RIGHT HAND  Final   Special Requests   Final    BOTTLES DRAWN AEROBIC AND ANAEROBIC Blood Culture results may not be optimal due to an inadequate volume of blood received in culture bottles   Culture NO GROWTH 2 DAYS  Final   Report Status PENDING  Incomplete     Labs: Basic Metabolic Panel: Recent Labs  Lab 02/01/17 1423 02/02/17 0609 02/03/17 0705  NA 137 139 140  K 3.0* 3.8 3.6  CL 102 108 112*  CO2 27 24 24   GLUCOSE 116* 92 94  BUN 37* 32* 24*  CREATININE 2.26* 1.48* 0.92  CALCIUM 9.7 8.5* 8.3*   Liver Function Tests: Recent Labs  Lab 02/01/17 1423  AST 30  ALT 16  ALKPHOS 111  BILITOT 0.6  PROT 6.9  ALBUMIN 3.1*   No results for input(s): LIPASE, AMYLASE in the last 168 hours. No results for input(s): AMMONIA in the last 168 hours. CBC: Recent Labs  Lab 02/01/17 1423 02/02/17 0609 02/03/17 0705  WBC 17.2* 14.1* 15.3*  NEUTROABS 13.2*  --   --   HGB 11.4* 10.1* 8.8*  HCT 36.6 32.5* 28.5*  MCV 98.4 98.5 99.7  PLT 133* 112* 114*   Cardiac Enzymes: Recent Labs  Lab 02/01/17 1423  TROPONINI 0.03*   BNP: BNP (last 3 results) No results for input(s): BNP in the last 8760 hours.  ProBNP (last 3 results) No results for input(s): PROBNP in the last 8760 hours.  CBG: Recent Labs  Lab 02/01/17 1335  GLUCAP 108*       Signed:  Lelon Frohlich  [facility] Pager: [phone] 02/03/2017, 4:33  PM

## 2017-02-03 NOTE — Plan of Care (Signed)
Pt is progressing 

## 2017-02-03 NOTE — Progress Notes (Signed)
Removed IV-clean, dry, and intact. Reviewed d/c paperwork with patient and her son, daughter, and son-in-law. Answered all questions and discussed home health and home medications. Wheeled stable pt to main entrance to car. She left in her son's car. Sent all belongings with patient.

## 2017-02-06 LAB — CULTURE, BLOOD (ROUTINE X 2)
CULTURE: NO GROWTH
Culture: NO GROWTH

## 2017-02-11 ENCOUNTER — Other Ambulatory Visit: Payer: Self-pay | Admitting: Family Medicine

## 2017-02-16 ENCOUNTER — Other Ambulatory Visit: Payer: Self-pay | Admitting: Family Medicine

## 2017-02-19 NOTE — Telephone Encounter (Signed)
Dr. Laurance Flatten, I do not see the Valsartan/HCTZ listed on the patient's medication list, so I am sending to you to determine if it is okay to fill prescription.

## 2017-02-20 ENCOUNTER — Ambulatory Visit: Payer: Medicare Other | Admitting: Family Medicine

## 2017-02-20 NOTE — Telephone Encounter (Signed)
Please confirm with the patient's son that she is taking this medicine before refilling

## 2017-02-20 NOTE — Telephone Encounter (Signed)
Per [facility] - she is taking regularly. LM for [employer]

## 2017-02-20 NOTE — Telephone Encounter (Signed)
Please confirm with the patient signed that this is the correct medication for her before refilling it

## 2017-02-25 ENCOUNTER — Other Ambulatory Visit: Payer: Self-pay | Admitting: Family Medicine

## 2017-03-01 ENCOUNTER — Other Ambulatory Visit (INDEPENDENT_AMBULATORY_CARE_PROVIDER_SITE_OTHER): Payer: Self-pay | Admitting: Specialist

## 2017-03-06 DIAGNOSIS — E119 Type 2 diabetes mellitus without complications: Secondary | ICD-10-CM | POA: Diagnosis not present

## 2017-03-06 DIAGNOSIS — H353132 Nonexudative age-related macular degeneration, bilateral, intermediate dry stage: Secondary | ICD-10-CM | POA: Diagnosis not present

## 2017-03-06 DIAGNOSIS — H52203 Unspecified astigmatism, bilateral: Secondary | ICD-10-CM | POA: Diagnosis not present

## 2017-03-06 DIAGNOSIS — H04123 Dry eye syndrome of bilateral lacrimal glands: Secondary | ICD-10-CM | POA: Diagnosis not present

## 2017-03-06 LAB — HM DIABETES EYE EXAM

## 2017-03-07 ENCOUNTER — Telehealth: Payer: Self-pay | Admitting: Family Medicine

## 2017-03-07 ENCOUNTER — Ambulatory Visit: Payer: Medicare Other | Admitting: Family Medicine

## 2017-03-07 NOTE — Telephone Encounter (Signed)
Patient does not want to come in tomorrow - apt re scheduled for 1/29 with [facility] for hospital follow up. FYI

## 2017-03-08 ENCOUNTER — Ambulatory Visit: Payer: Medicare Other | Admitting: Family Medicine

## 2017-03-08 ENCOUNTER — Encounter: Payer: Self-pay | Admitting: *Deleted

## 2017-03-27 ENCOUNTER — Ambulatory Visit: Payer: Medicare Other | Admitting: Neurology

## 2017-03-29 ENCOUNTER — Other Ambulatory Visit: Payer: Self-pay | Admitting: Family Medicine

## 2017-04-05 DIAGNOSIS — F4323 Adjustment disorder with mixed anxiety and depressed mood: Secondary | ICD-10-CM | POA: Diagnosis not present

## 2017-04-09 ENCOUNTER — Ambulatory Visit (INDEPENDENT_AMBULATORY_CARE_PROVIDER_SITE_OTHER): Payer: Medicare Other | Admitting: Family Medicine

## 2017-04-09 ENCOUNTER — Encounter: Payer: Self-pay | Admitting: Family Medicine

## 2017-04-09 VITALS — BP 127/63 | HR 82 | Temp 97.0°F | Ht 65.0 in | Wt 143.0 lb

## 2017-04-09 DIAGNOSIS — R5383 Other fatigue: Secondary | ICD-10-CM

## 2017-04-09 DIAGNOSIS — I10 Essential (primary) hypertension: Secondary | ICD-10-CM

## 2017-04-09 DIAGNOSIS — H6123 Impacted cerumen, bilateral: Secondary | ICD-10-CM

## 2017-04-09 DIAGNOSIS — F99 Mental disorder, not otherwise specified: Secondary | ICD-10-CM

## 2017-04-09 DIAGNOSIS — D696 Thrombocytopenia, unspecified: Secondary | ICD-10-CM

## 2017-04-09 DIAGNOSIS — N644 Mastodynia: Secondary | ICD-10-CM | POA: Diagnosis not present

## 2017-04-09 DIAGNOSIS — E559 Vitamin D deficiency, unspecified: Secondary | ICD-10-CM

## 2017-04-09 DIAGNOSIS — R531 Weakness: Secondary | ICD-10-CM

## 2017-04-09 DIAGNOSIS — R410 Disorientation, unspecified: Secondary | ICD-10-CM

## 2017-04-09 DIAGNOSIS — M5416 Radiculopathy, lumbar region: Secondary | ICD-10-CM

## 2017-04-09 NOTE — Patient Instructions (Signed)
Patient should continue to drink plenty of fluids and stay well-hydrated She should be in close touch to her body as possible and should have urine checked if she develops any symptoms with passing her water. Should the family noticed any confusion she should have a repeat urinalysis and CBC.  Develops any nausea or vomiting she should leave her blood pressure/fluid pill medicine off so that she does not get more dehydrated. We will call her son with the lab work results as soon as those become available.

## 2017-04-09 NOTE — Progress Notes (Addendum)
Subjective:    Patient ID: [name], female    DOB: Feb 07, 1926, 84 y.o.   MRN: [id number]  HPI Patient here today for hospital follow up from November, when she was sent to [facility] for confusion and dehydration.  The hospital discharge summary from November was reviewed.  She was supposed to have a follow-up visit with neurology following this admission.  She was diagnosed with acute renal failure secondary to dehydration.  She also had a UTI which was felt to contribute to her encephalopathy.  She was supposed to have a visit with neurology to follow-up with her hospital admission.  In the computer record I do not see where that happened.  The patient has a history of chronic back pain with a lumbar laminectomy.  This is been her biggest problem from the past is dealing with this pain.  She also has a history of hyperlipidemia hypertension.  Patient did have a vertebroplasty after her back surgery by Dr. Louanne Skye and the vertebroplasty really helped control her back pain much better.  She is using a walker regularly and her son monitors her very closely.  Since she has been at home following the admission for confusion and dehydration she is getting stronger but still has a lot of weakness and has had trouble getting back to her usual strength.  She does try to drink more fluids.  She does not get out much other than going to the [place].  She is denies any chest pain or shortness of breath anymore than usual.  She is had no trouble with nausea vomiting diarrhea blood in the stool or black tarry bowel movements.  She is passing her water without problems.  She complains of breast soreness bilaterally.    Patient Active Problem List   Diagnosis Date Noted  . Confusion and disorientation 02/01/2017  . UTI (urinary tract infection) 02/01/2017  . AKI (acute kidney injury) ([place]) 02/01/2017  . Lumbar radiculopathy 03/14/2016  . HNP (herniated nucleus pulposus), cervical 05/16/2015    Class: Chronic   . Herniated nucleus pulposus, lumbar 05/16/2015  . Abnormal EKG 04/21/2015  . Thrombocytopenia ([place]) 08/18/2014  . Insomnia 11/24/2013  . Impingement syndrome of left shoulder 09/18/2013  . S/P arthroscopy of shoulder 09/18/2013  . Low back pain 03/17/2013  . Neuropathy 03/17/2013  . Metabolic syndrome 37/62/8315  . Overweight (BMI 25.0-29.9) 03/17/2013  . Vitamin D deficiency 12/01/2012  . Chronic insomnia 12/01/2012  . Fibrocystic breast changes 05/21/2011  . Essential hypertension   . Hyperlipidemia   . Peptic ulcer disease   . ITP (idiopathic thrombocytopenic purpura)   . Rhinitis   . Elevated blood sugar   . Hypothyroidism   . Goiter   . Colon polyps   . Adhesion of abdominal wall   . Esophageal stricture    Outpatient Encounter Medications as of 04/09/2017  Medication Sig  . atorvastatin (LIPITOR) 80 MG tablet TAKE 1 TABLET ONCE A DAY  . calcium citrate-vitamin D (CITRACAL+D) 315-200 MG-UNIT per tablet Take 1 tablet by mouth daily.  . Cholecalciferol (VITAMIN D3) 2000 UNITS TABS Take 1 tablet by mouth daily.    . DULoxetine (CYMBALTA) 60 MG capsule Take 60 mg by mouth every morning.  . fluticasone (FLONASE) 50 MCG/ACT nasal spray Place 2 sprays into both nostrils daily.  Marland Kitchen levothyroxine (SYNTHROID, LEVOTHROID) 125 MCG tablet Take 1 tablet (125 mcg total) by mouth daily before breakfast.  . meclizine (ANTIVERT) 25 MG tablet TAKE 1/2 TABLET 3 TIMES A  DAY AS NEEDED FOR DIZZINESS  . Multiple Vitamin (MULTIVITAMIN WITH MINERALS) TABS tablet Take 1 tablet by mouth daily.  . polyvinyl alcohol (LIQUIFILM TEARS) 1.4 % ophthalmic solution Place 1 drop into both eyes as needed for dry eyes.  . potassium chloride (K-DUR) 10 MEQ tablet Take 1 tablet (10 mEq total) by mouth daily.  . valsartan-hydrochlorothiazide (DIOVAN-HCT) 320-25 MG tablet TAKE 1 TABLET DAILY  . [DISCONTINUED] ciprofloxacin (CIPRO) 250 MG tablet Take 1 tablet (250 mg total) by mouth 2 (two) times daily.  .  [DISCONTINUED] traZODone (DESYREL) 50 MG tablet TAKE UP TO 2 TABLETS AT BEDTIME FOR SLEEP  . DULoxetine (CYMBALTA) 30 MG capsule Take 3 capsules by mouth daily.  Marland Kitchen gabapentin (NEURONTIN) 400 MG capsule Take 1 capsule by mouth daily.   Facility-Administered Encounter Medications as of 04/09/2017  Medication  . calcitonin (salmon) (MIACALCIN/FORTICAL) nasal spray 1 spray     Review of Systems  Constitutional: Negative.   HENT: Negative.   Eyes: Negative.   Respiratory: Negative.   Cardiovascular: Negative.   Gastrointestinal: Negative.   Endocrine: Negative.   Genitourinary: Negative.   Musculoskeletal: Negative.        Bilateral breast pain (R>L)  Skin: Negative.   Allergic/Immunologic: Negative.   Neurological: Negative.   Hematological: Negative.   Psychiatric/Behavioral: Negative.        Objective:   Physical Exam  Constitutional: She is oriented to person, place, and time. She appears well-developed and well-nourished. No distress.  Patient is elderly but pleasant and alert today.  She comes to the visit today with her son.  She is taking Ensure.  HENT:  Head: Normocephalic and atraumatic.  Nose: Nose normal.  Mouth/Throat: Oropharynx is clear and moist. No oropharyngeal exudate.  Bilateral ear cerumen  Eyes: Conjunctivae and EOM are normal. Pupils are equal, round, and reactive to light. Right eye exhibits no discharge. Left eye exhibits no discharge. No scleral icterus.  Neck: Normal range of motion. Neck supple. No thyromegaly present.  No bruits thyromegaly or anterior cervical adenopathy  Cardiovascular: Normal rate, regular rhythm and normal heart sounds.  No murmur heard. Heart is regular at 84/min  Pulmonary/Chest: Effort normal and breath sounds normal. No respiratory distress. She has no wheezes. She has no rales. She exhibits no tenderness.  Abdominal: Soft. Bowel sounds are normal. She exhibits no mass. There is no tenderness. There is no rebound and no  guarding.  No abdominal tenderness masses organ enlargement or bruits  Genitourinary:  Genitourinary Comments: Both breasts were checked and no lumps or masses are axillary adenopathy were noted.  Musculoskeletal: She exhibits no edema.  The patient uses a walker for ambulation and needs assistance with getting on the table and getting off the table.  Her gait remains somewhat unstable.  Lymphadenopathy:    She has no cervical adenopathy.  Neurological: She is alert and oriented to person, place, and time. She has normal reflexes. No cranial nerve deficit.  Skin: Skin is warm and dry. No rash noted. No erythema.  Psychiatric: She has a normal mood and affect. Her behavior is normal. Judgment and thought content normal.  Nursing note and vitals reviewed.   BP 127/63 (BP Location: Right Arm)   Pulse 82   Temp (!) 97 F (36.1 C) (Oral)   Ht 5\' 5"  (1.651 m)   Wt 143 lb (64.9 kg)   BMI 23.80 kg/m        Assessment & Plan:  1. Confusion and disorientation -This is greatly improved  following rehydration and treatment of urinary tract infection.  The family did not go follow-up with a neurologist and felt like that was not necessary since there was a reason for the symptoms that she was having during the hospital admission  2. Lumbar radiculopathy -This is greatly improved following the vertebroplasty.  3. Vitamin D deficiency -Continue current treatment and make all efforts at preventing any falls.  4. Thrombocytopenia ([facility]) -No bleeding issues noted  5. Essential hypertension -Blood pressure is good today  6. Weakness -She continues to have the weakness but this is getting better. -We will also check a thyroid profile.  7. Fatigue, unspecified type -Planned lab work plus thyroid profile.  8.  Bilateral breast soreness -Exam was negative and patient was told to wear a more supportive bra.  9.  Bilateral ear cerumen -Irrigation to remove wax from both ears because of  decreased hearing.  Patient Instructions  Patient should continue to drink plenty of fluids and stay well-hydrated She should be in close touch to her body as possible and should have urine checked if she develops any symptoms with passing her water. Should the family noticed any confusion she should have a repeat urinalysis and CBC.  Develops any nausea or vomiting she should leave her blood pressure/fluid pill medicine off so that she does not get more dehydrated. We will call her son with the lab work results as soon as those become available.  Arrie Senate MD

## 2017-04-09 NOTE — Addendum Note (Signed)
Addended by: Zannie Cove on: 04/09/2017 04:14 PM   Modules accepted: Orders

## 2017-04-11 LAB — CBC WITH DIFFERENTIAL/PLATELET
BASOS ABS: 0.4 10*3/uL — AB (ref 0.0–0.2)
BASOS: 2 %
EOS (ABSOLUTE): 0.4 10*3/uL (ref 0.0–0.4)
EOS: 2 %
HEMOGLOBIN: 10.1 g/dL — AB (ref 11.1–15.9)
Hematocrit: 31.6 % — ABNORMAL LOW (ref 34.0–46.6)
Lymphocytes Absolute: 4.6 10*3/uL — ABNORMAL HIGH (ref 0.7–3.1)
Lymphs: 21 %
MCH: 29.4 pg (ref 26.6–33.0)
MCHC: 32 g/dL (ref 31.5–35.7)
MCV: 92 fL (ref 79–97)
Monocytes Absolute: 1.1 10*3/uL — ABNORMAL HIGH (ref 0.1–0.9)
Monocytes: 5 %
Neutrophils Absolute: 14.6 10*3/uL — ABNORMAL HIGH (ref 1.4–7.0)
Neutrophils: 66 %
Platelets: 196 10*3/uL (ref 150–379)
RBC: 3.44 x10E6/uL — ABNORMAL LOW (ref 3.77–5.28)
RDW: 13.3 % (ref 12.3–15.4)
WBC: 22.1 10*3/uL (ref 3.4–10.8)

## 2017-04-11 LAB — BMP8+EGFR
BUN / CREAT RATIO: 17 (ref 12–28)
BUN: 14 mg/dL (ref 8–27)
CALCIUM: 9 mg/dL (ref 8.7–10.3)
CHLORIDE: 100 mmol/L (ref 96–106)
CO2: 26 mmol/L (ref 20–29)
CREATININE: 0.83 mg/dL (ref 0.57–1.00)
GFR calc Af Amer: 72 mL/min/{1.73_m2} (ref >59)
GFR calc non Af Amer: 63 mL/min/{1.73_m2} (ref >59)
GLUCOSE: 90 mg/dL (ref 65–99)
Potassium: 3.7 mmol/L (ref 3.5–5.2)
Sodium: 141 mmol/L (ref 134–144)

## 2017-04-11 LAB — IMMATURE CELLS
Blasts/blast like cells: 2 % — ABNORMAL HIGH (ref 0–0)
Metamyelocytes: 2 % — ABNORMAL HIGH (ref 0–0)

## 2017-04-12 ENCOUNTER — Telehealth: Payer: Self-pay | Admitting: Family Medicine

## 2017-04-12 ENCOUNTER — Other Ambulatory Visit: Payer: Medicare Other

## 2017-04-12 ENCOUNTER — Other Ambulatory Visit: Payer: Self-pay | Admitting: *Deleted

## 2017-04-12 DIAGNOSIS — D72829 Elevated white blood cell count, unspecified: Secondary | ICD-10-CM | POA: Diagnosis not present

## 2017-04-12 DIAGNOSIS — R531 Weakness: Secondary | ICD-10-CM | POA: Diagnosis not present

## 2017-04-12 NOTE — Telephone Encounter (Signed)
Pt son aware of labs

## 2017-04-13 LAB — CBC WITH DIFFERENTIAL/PLATELET
Basophils Absolute: 0.2 x10E3/uL (ref 0.0–0.2)
Basos: 1 %
EOS (ABSOLUTE): 0.6 x10E3/uL — ABNORMAL HIGH (ref 0.0–0.4)
Eos: 3 %
Hematocrit: 32.1 % — ABNORMAL LOW (ref 34.0–46.6)
Hemoglobin: 10.4 g/dL — ABNORMAL LOW (ref 11.1–15.9)
Lymphocytes Absolute: 4.9 x10E3/uL — ABNORMAL HIGH (ref 0.7–3.1)
Lymphs: 23 %
MCH: 30.2 pg (ref 26.6–33.0)
MCHC: 32.4 g/dL (ref 31.5–35.7)
MCV: 93 fL (ref 79–97)
Monocytes Absolute: 0.8 x10E3/uL (ref 0.1–0.9)
Monocytes: 4 %
Neutrophils Absolute: 14 x10E3/uL — ABNORMAL HIGH (ref 1.4–7.0)
Neutrophils: 66 %
Platelets: 211 x10E3/uL (ref 150–379)
RBC: 3.44 x10E6/uL — ABNORMAL LOW (ref 3.77–5.28)
RDW: 13.3 % (ref 12.3–15.4)
WBC: 21.2 x10E3/uL (ref 3.4–10.8)

## 2017-04-13 LAB — URINE CULTURE

## 2017-04-13 LAB — IMMATURE CELLS: MYELOCYTES: 3 % — ABNORMAL HIGH

## 2017-04-16 ENCOUNTER — Other Ambulatory Visit: Payer: Self-pay | Admitting: *Deleted

## 2017-04-16 DIAGNOSIS — D72829 Elevated white blood cell count, unspecified: Secondary | ICD-10-CM

## 2017-04-26 ENCOUNTER — Other Ambulatory Visit: Payer: Self-pay | Admitting: Family Medicine

## 2017-05-02 ENCOUNTER — Other Ambulatory Visit: Payer: Self-pay | Admitting: Family Medicine

## 2017-05-06 ENCOUNTER — Ambulatory Visit: Payer: Medicare Other | Admitting: Family Medicine

## 2017-05-09 ENCOUNTER — Inpatient Hospital Stay (HOSPITAL_COMMUNITY): Payer: Medicare Other

## 2017-05-09 ENCOUNTER — Encounter (HOSPITAL_COMMUNITY): Payer: Self-pay | Admitting: Internal Medicine

## 2017-05-09 ENCOUNTER — Inpatient Hospital Stay (HOSPITAL_COMMUNITY): Payer: Medicare Other | Attending: Internal Medicine | Admitting: Internal Medicine

## 2017-05-09 ENCOUNTER — Other Ambulatory Visit: Payer: Self-pay

## 2017-05-09 VITALS — BP 143/116 | HR 94 | Temp 98.4°F | Resp 16 | Ht 65.0 in | Wt 145.3 lb

## 2017-05-09 DIAGNOSIS — Z801 Family history of malignant neoplasm of trachea, bronchus and lung: Secondary | ICD-10-CM | POA: Diagnosis not present

## 2017-05-09 DIAGNOSIS — I10 Essential (primary) hypertension: Secondary | ICD-10-CM | POA: Insufficient documentation

## 2017-05-09 DIAGNOSIS — Z79899 Other long term (current) drug therapy: Secondary | ICD-10-CM | POA: Insufficient documentation

## 2017-05-09 DIAGNOSIS — E785 Hyperlipidemia, unspecified: Secondary | ICD-10-CM | POA: Insufficient documentation

## 2017-05-09 DIAGNOSIS — M1990 Unspecified osteoarthritis, unspecified site: Secondary | ICD-10-CM | POA: Insufficient documentation

## 2017-05-09 DIAGNOSIS — D72829 Elevated white blood cell count, unspecified: Secondary | ICD-10-CM | POA: Insufficient documentation

## 2017-05-09 DIAGNOSIS — D693 Immune thrombocytopenic purpura: Secondary | ICD-10-CM | POA: Diagnosis not present

## 2017-05-09 DIAGNOSIS — E039 Hypothyroidism, unspecified: Secondary | ICD-10-CM | POA: Diagnosis not present

## 2017-05-09 DIAGNOSIS — D72828 Other elevated white blood cell count: Secondary | ICD-10-CM | POA: Diagnosis not present

## 2017-05-09 LAB — CBC WITH DIFFERENTIAL/PLATELET
BASOS ABS: 0 10*3/uL (ref 0.0–0.1)
Basophils Relative: 0 %
EOS ABS: 0.8 10*3/uL — AB (ref 0.0–0.7)
Eosinophils Relative: 4 %
HEMATOCRIT: 32.2 % — AB (ref 36.0–46.0)
Hemoglobin: 9.9 g/dL — ABNORMAL LOW (ref 12.0–15.0)
LYMPHS PCT: 18 %
Lymphs Abs: 3.4 10*3/uL (ref 0.7–4.0)
MCH: 30 pg (ref 26.0–34.0)
MCHC: 30.7 g/dL (ref 30.0–36.0)
MCV: 97.6 fL (ref 78.0–100.0)
MONOS PCT: 6 %
Monocytes Absolute: 1.1 10*3/uL — ABNORMAL HIGH (ref 0.1–1.0)
NEUTROS PCT: 72 %
Neutro Abs: 13.8 10*3/uL — ABNORMAL HIGH (ref 1.7–7.7)
Platelets: 165 10*3/uL (ref 150–400)
RBC: 3.3 MIL/uL — AB (ref 3.87–5.11)
RDW: 13.6 % (ref 11.5–15.5)
WBC: 19.1 10*3/uL — ABNORMAL HIGH (ref 4.0–10.5)

## 2017-05-09 LAB — C-REACTIVE PROTEIN: CRP: <0.8 mg/dL (ref <1.0)

## 2017-05-09 LAB — COMPREHENSIVE METABOLIC PANEL
ALBUMIN: 2.7 g/dL — AB (ref 3.5–5.0)
ALT: 14 U/L (ref 14–54)
ANION GAP: 8 (ref 5–15)
AST: 25 U/L (ref 15–41)
Alkaline Phosphatase: 90 U/L (ref 38–126)
BILIRUBIN TOTAL: 0.5 mg/dL (ref 0.3–1.2)
BUN: 19 mg/dL (ref 6–20)
CALCIUM: 9.1 mg/dL (ref 8.9–10.3)
CO2: 25 mmol/L (ref 22–32)
Chloride: 103 mmol/L (ref 101–111)
Creatinine, Ser: 1.09 mg/dL — ABNORMAL HIGH (ref 0.44–1.00)
GFR calc non Af Amer: 44 mL/min — ABNORMAL LOW (ref >60)
GFR, EST AFRICAN AMERICAN: 51 mL/min — AB (ref >60)
GLUCOSE: 102 mg/dL — AB (ref 65–99)
POTASSIUM: 3.8 mmol/L (ref 3.5–5.1)
SODIUM: 136 mmol/L (ref 135–145)
TOTAL PROTEIN: 6.8 g/dL (ref 6.5–8.1)

## 2017-05-09 LAB — LACTATE DEHYDROGENASE: LDH: 154 U/L (ref 98–192)

## 2017-05-09 LAB — SEDIMENTATION RATE: SED RATE: 55 mm/h — AB (ref 0–22)

## 2017-05-09 NOTE — Patient Instructions (Signed)
[place] at [facility] Discharge Instructions  RECOMMENDATIONS MADE BY THE CONSULTANT AND ANY TEST RESULTS WILL BE SENT TO YOUR REFERRING PHYSICIAN.  You were seen today by Dr. Zoila Shutter We will have you do lab work today Follow up in 2 weeks to review results  Thank you for choosing [place] at [facility] to provide your oncology and hematology care.  To afford each patient quality time with our provider, please arrive at least 15 minutes before your scheduled appointment time.    If you have a lab appointment with the [facility] please come in thru the  Main Entrance and check in at the main information desk  You need to re-schedule your appointment should you arrive 10 or more minutes late.  We strive to give you quality time with our providers, and arriving late affects you and other patients whose appointments are after yours.  Also, if you no show three or more times for appointments you may be dismissed from the clinic at the providers discretion.     Again, thank you for choosing [facility].  Our hope is that these requests will decrease the amount of time that you wait before being seen by our physicians.       _____________________________________________________________  Should you have questions after your visit to [facility], please contact our office at [phone] between the hours of 8:30 a.m. and 4:30 p.m.  Voicemails left after 4:30 p.m. will not be returned until the following business day.  For prescription refill requests, have your pharmacy contact our office.       Resources For Cancer Patients and their Caregivers ? American Cancer Society: Can assist with transportation, wigs, general needs, runs Look Good Feel Better.        (978) 797-0481 ? Cancer Care: Provides financial assistance, online support groups, medication/co-pay assistance.  1-800-813-HOPE (225) 155-6680) ? [facility] Assists [place] cancer patients and their families through emotional , educational and financial support.  321-242-3277 ? [place] DSS Where to apply for food stamps, Medicaid and utility assistance. 6084036555 ? RCATS: Transportation to medical appointments. 260-526-4583 ? Social Security Administration: May apply for disability if have a Stage IV cancer. (918)592-3030 806-771-7732 ? [employer], Disability and Transit Services: Assists with nutrition, care and transit needs. [place] Support Programs: @10RELATIVEDAYS @ > Cancer Support Group  2nd Tuesday of the month 1pm-2pm, Journey Room  > Creative Journey  3rd Tuesday of the month 1130am-1pm, Journey Room  > Look Good Feel Better  1st Wednesday of the month 10am-12 noon, Journey Room (Call [place] to register [phone])

## 2017-05-13 LAB — BCR-ABL1, CML/ALL, PCR, QUANT

## 2017-05-20 DIAGNOSIS — F4323 Adjustment disorder with mixed anxiety and depressed mood: Secondary | ICD-10-CM | POA: Diagnosis not present

## 2017-05-23 ENCOUNTER — Ambulatory Visit: Payer: Medicare Other | Admitting: Neurology

## 2017-05-23 ENCOUNTER — Inpatient Hospital Stay (HOSPITAL_COMMUNITY): Payer: Medicare Other | Attending: Internal Medicine | Admitting: Internal Medicine

## 2017-05-23 ENCOUNTER — Encounter (HOSPITAL_COMMUNITY): Payer: Self-pay | Admitting: Internal Medicine

## 2017-05-23 VITALS — BP 130/51 | HR 77 | Temp 98.0°F | Resp 18 | Wt 145.7 lb

## 2017-05-23 DIAGNOSIS — D72829 Elevated white blood cell count, unspecified: Secondary | ICD-10-CM | POA: Insufficient documentation

## 2017-05-23 DIAGNOSIS — F329 Major depressive disorder, single episode, unspecified: Secondary | ICD-10-CM | POA: Diagnosis not present

## 2017-05-23 DIAGNOSIS — E785 Hyperlipidemia, unspecified: Secondary | ICD-10-CM

## 2017-05-23 DIAGNOSIS — D72828 Other elevated white blood cell count: Secondary | ICD-10-CM

## 2017-05-23 DIAGNOSIS — E039 Hypothyroidism, unspecified: Secondary | ICD-10-CM | POA: Diagnosis not present

## 2017-05-23 DIAGNOSIS — Z801 Family history of malignant neoplasm of trachea, bronchus and lung: Secondary | ICD-10-CM | POA: Insufficient documentation

## 2017-05-23 DIAGNOSIS — I10 Essential (primary) hypertension: Secondary | ICD-10-CM | POA: Insufficient documentation

## 2017-05-23 DIAGNOSIS — Z79899 Other long term (current) drug therapy: Secondary | ICD-10-CM | POA: Insufficient documentation

## 2017-05-23 DIAGNOSIS — Z888 Allergy status to other drugs, medicaments and biological substances status: Secondary | ICD-10-CM | POA: Insufficient documentation

## 2017-05-23 NOTE — Patient Instructions (Signed)
[place] at [facility] Discharge Instructions   You were seen today by Dr. Zoila Shutter She went over your lab results and said that your labs looked good. But there is concern because your white count is elevated. She discussed a bone marrow biopsy and how this would be a more definitive test to prove if there is something else going on. Sometimes some medications can cause your white count to be elevated. Dr. Walden Field suggests that we monitor your labs and follow up with you in May.   Thank you for choosing [place] at [facility] to provide your oncology and hematology care.  To afford each patient quality time with our provider, please arrive at least 15 minutes before your scheduled appointment time.    If you have a lab appointment with the [facility] please come in thru the  Main Entrance and check in at the main information desk  You need to re-schedule your appointment should you arrive 10 or more minutes late.  We strive to give you quality time with our providers, and arriving late affects you and other patients whose appointments are after yours.  Also, if you no show three or more times for appointments you may be dismissed from the clinic at the providers discretion.     Again, thank you for choosing [facility].  Our hope is that these requests will decrease the amount of time that you wait before being seen by our physicians.       _____________________________________________________________  Should you have questions after your visit to [facility], please contact our office at [phone] between the hours of 8:30 a.m. and 4:30 p.m.  Voicemails left after 4:30 p.m. will not be returned until the following business day.  For prescription refill requests, have your pharmacy contact our office.       Resources For Cancer Patients and their Caregivers ? American Cancer Society: Can assist with  transportation, wigs, general needs, runs Look Good Feel Better.        [phone] ? Cancer Care: Provides financial assistance, online support groups, medication/co-pay assistance.  1-800-813-HOPE 727-289-5505) ? [facility] Assists [place] cancer patients and their families through emotional , educational and financial support.  (859) 412-6769 ? [place] DSS Where to apply for food stamps, Medicaid and utility assistance. 9496156168 ? RCATS: Transportation to medical appointments. (437)008-8739 ? Social Security Administration: May apply for disability if have a Stage IV cancer. 6847501343 (979)578-9402 ? [employer], Disability and Transit Services: Assists with nutrition, care and transit needs. [place] Support Programs:   > Cancer Support Group  2nd Tuesday of the month 1pm-2pm, Journey Room   > Creative Journey  3rd Tuesday of the month 1130am-1pm, Journey Room

## 2017-05-23 NOTE — Progress Notes (Signed)
Diagnosis Other elevated white blood cell (WBC) count - Plan: CBC with Differential/Platelet, Comprehensive metabolic panel, Lactate dehydrogenase  Staging Cancer Staging No matching staging information was found for the patient.  Assessment and Plan: 1.  Leukocytosis.  Patient is seen today for follow-up to go over lab studies.  Labs done 05/09/2017 show a white count of 19 hemoglobin 9.9 platelets 165,000.  She has a moderate left shift.  Chemistries within normal limits creatinine is 1, liver function tests are normal.  BCR abl testing was negative.  I have discussed with the patient and her family member that the only way for definitive diagnosis would be to recommend bone marrow biopsy.  They indicate she has previously had a bone marrow biopsy done in [place] but are unsure when that was performed and indicate it was many years ago.  We will contact pathology to determine if we are able to find that information.  Currently they desire  to be followed closely and she will return to clinic in 2 months for repeat labs.  They are advised to notify the office if she has any issues prior to that next visit.  2.  Hypertension.  BP is 130/51.  Continue to follow-up with PCP.    3.  Hypothyroidism.  Pt is on synthroid.  Continue to follow-up with PCP.  4.  Depression.  She is on Seroquel.  I have discussed with them also some antidepressants may also cause changes in the blood counts.  Interval History: 84 year old female with a history of persistent leukocytosis.  She reportedly has undergone bone marrow biopsies in the past in [place].  She reports she is also had her spleen removed but denies any involvement by lymphoma.  Current Status: Patient is seen today for follow-up.  She is doing well and denies any complaints.  She is here to go over lab studies.  Problem List Patient Active Problem List   Diagnosis Date Noted  . Confusion and disorientation [F99] 02/01/2017  . UTI (urinary  tract infection) [N39.0] 02/01/2017  . AKI (acute kidney injury) ([place]) [N17.9] 02/01/2017  . Lumbar radiculopathy [M54.16] 03/14/2016  . HNP (herniated nucleus pulposus), cervical [M50.20] 05/16/2015    Class: Chronic  . Herniated nucleus pulposus, lumbar [M51.26] 05/16/2015  . Abnormal EKG [R94.31] 04/21/2015  . Thrombocytopenia ([place]) [D69.6] 08/18/2014  . Insomnia [G47.00] 11/24/2013  . Impingement syndrome of left shoulder [M75.42] 09/18/2013  . S/P arthroscopy of shoulder [Z98.890] 09/18/2013  . Low back pain [M54.5] 03/17/2013  . Neuropathy [G62.9] 03/17/2013  . Metabolic syndrome [Y81.44] 03/17/2013  . Overweight (BMI 25.0-29.9) [E66.3] 03/17/2013  . Vitamin D deficiency [E55.9] 12/01/2012  . Chronic insomnia [F51.04] 12/01/2012  . Fibrocystic breast changes [N60.19] 05/21/2011  . Essential hypertension [I10]   . Hyperlipidemia [E78.5]   . Peptic ulcer disease [K27.9]   . ITP (idiopathic thrombocytopenic purpura) [D69.3]   . Rhinitis [J31.0]   . Elevated blood sugar [R73.9]   . Hypothyroidism [E03.9]   . Goiter [E04.9]   . Colon polyps [K63.5]   . Adhesion of abdominal wall [K66.0]   . Esophageal stricture [K22.2]     Past Medical History Past Medical History:  Diagnosis Date  . Adhesion of abdominal wall    "continues to have abdominal pain pain intermittent right abdominal side. "scar tissue"  . Arthritis   . Breast cyst   . Bronchitis, asthmatic    'bronchitis that will go into an asthma like attack but haven't had it in years'  . Colon  polyps   . Elevated blood sugar   . Esophageal stricture    "occ. problems with swallowing water"  . Full dentures   . GI bleed   . Goiter   . History of blood transfusion   . History of hiatal hernia    repaired over 20 years ago  . ITP (idiopathic thrombocytopenic purpura)    not a problem now.  . Other and unspecified hyperlipidemia   . Peptic ulcer disease   . Rhinitis   . Unspecified essential hypertension   .  Unspecified hypothyroidism     Past Surgical History Past Surgical History:  Procedure Laterality Date  . ABDOMINAL HYSTERECTOMY    . APPENDECTOMY    . BACK SURGERY     x1 Lumbar, x2 cervical anterior and posterior .  [facility] BREAST SURGERY     x3 biopsy  . cataract surgery Bilateral   . CHOLECYSTECTOMY    . EYE SURGERY Bilateral 2007   cataracts   . HEMORRHOID SURGERY    . HERNIA REPAIR    . IR RADIOLOGIST EVAL & MGMT  12/24/2016  . IR SACROPLASTY BILATERAL  12/28/2016  . JOINT REPLACEMENT    . LUMBAR LAMINECTOMY N/A 05/16/2015   Procedure: MICRODISCECTOMY Lumbar 3-Lumbar 4;  Surgeon: Jessy Oto, MD;  Location: [facility];  Service: Orthopedics;  Laterality: N/A;  . 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 THYROID SURGERY     Total "goiter removal"  . TOTAL KNEE ARTHROPLASTY Right 2010    Family History Family History  Problem Relation Age of Onset  . Lung cancer Father   . Heart disease Mother        Unkown.  Died age 66  . Emphysema Sister      Social History  reports that she is a non-smoker but has been exposed to tobacco smoke. The exposure started about 72 years ago. she has never used smokeless tobacco. She reports that she does not drink alcohol or use drugs.  Medications  Current Outpatient Medications:  .  atorvastatin (LIPITOR) 80 MG tablet, TAKE 1 TABLET ONCE A DAY, Disp: 30 tablet, Rfl: 5 .  calcium citrate-vitamin D (CITRACAL+D) 315-200 MG-UNIT per tablet, Take 1 tablet by mouth daily., Disp: , Rfl:  .  Cholecalciferol (VITAMIN D3) 2000 UNITS TABS, Take 1 tablet by mouth daily.  , Disp: , Rfl:  .  DULoxetine (CYMBALTA) 30 MG capsule, Take 3 capsules by mouth daily., Disp: , Rfl: 1 .  DULoxetine (CYMBALTA) 60 MG capsule, Take 60 mg by mouth every morning., Disp: , Rfl:  .  fluticasone (FLONASE) 50 MCG/ACT nasal spray, Place 2 sprays into both nostrils daily., Disp: 16 g, Rfl: 6 .  gabapentin (NEURONTIN) 400 MG capsule, Take 1 capsule by mouth  daily., Disp: , Rfl: 1 .  levothyroxine (SYNTHROID, LEVOTHROID) 125 MCG tablet, Take 1 tablet (125 mcg total) by mouth daily before breakfast., Disp: , Rfl:  .  levothyroxine (SYNTHROID, LEVOTHROID) 150 MCG tablet, , Disp: , Rfl: 2 .  meclizine (ANTIVERT) 25 MG tablet, TAKE 1/2 TABLET 3 TIMES A DAY AS NEEDED FOR DIZZINESS, Disp: 30 tablet, Rfl: 2 .  meclizine (ANTIVERT) 25 MG tablet, TAKE 1/2 TABLET 3 TIMES A DAY AS NEEDED FOR DIZZINESS, Disp: 30 tablet, Rfl: 0 .  Multiple Vitamin (MULTIVITAMIN WITH MINERALS) TABS tablet, Take 1 tablet by mouth daily., Disp: , Rfl:  .  polyvinyl alcohol (LIQUIFILM TEARS) 1.4 % ophthalmic solution, Place 1 drop into  both eyes as needed for dry eyes., Disp: , Rfl:  .  potassium chloride (K-DUR) 10 MEQ tablet, Take 1 tablet (10 mEq total) by mouth daily., Disp: 30 tablet, Rfl: 2 .  QUEtiapine (SEROQUEL) 50 MG tablet, , Disp: , Rfl: 1 .  valsartan-hydrochlorothiazide (DIOVAN-HCT) 320-25 MG tablet, TAKE 1 TABLET DAILY, Disp: 30 tablet, Rfl: 3  Current Facility-Administered Medications:  .  calcitonin (salmon) (MIACALCIN/FORTICAL) nasal spray 1 spray, 1 spray, Alternating Nares, Daily, Jessy Oto, MD  Allergies Prednisone  Review of Systems Review of Systems - Oncology ROS as per HPI otherwise 12 point ROS is negative.   Physical Exam  Vitals Wt Readings from Last 3 Encounters:  05/23/17 145 lb 11.2 oz (66.1 kg)  05/09/17 145 lb 4.8 oz (65.9 kg)  04/09/17 143 lb (64.9 kg)   Temp Readings from Last 3 Encounters:  05/23/17 98 F (36.7 C) (Oral)  05/09/17 98.4 F (36.9 C) (Oral)  04/09/17 (!) 97 F (36.1 C) (Oral)   BP Readings from Last 3 Encounters:  05/23/17 (!) 130/51  05/09/17 (!) 143/116  04/09/17 127/63   Pulse Readings from Last 3 Encounters:  05/23/17 77  05/09/17 94  04/09/17 82   Constitutional: Well-developed, well-nourished, and in no distress.   HENT: Head: Normocephalic and atraumatic.  Mouth/Throat: No oropharyngeal  exudate. Mucosa moist. Eyes: Pupils are equal, round, and reactive to light. Conjunctivae are normal. No scleral icterus.  Neck: Normal range of motion. Neck supple. No JVD present.  Cardiovascular: Normal rate, regular rhythm and normal heart sounds.  Exam reveals no gallop and no friction rub.   No murmur heard. Pulmonary/Chest: Effort normal and breath sounds normal. No respiratory distress. No wheezes.No rales.  Abdominal: Soft. Bowel sounds are normal. No distension. There is no tenderness. There is no guarding.  Musculoskeletal: No edema or tenderness.  Lymphadenopathy: No cervical, axillary or supraclavicular adenopathy.  Neurological: Alert and oriented to person, place, and time. No cranial nerve deficit.  Skin: Skin is warm and dry. No rash noted. No erythema. No pallor.  Psychiatric: Affect and judgment normal.   Labs No visits with results within 3 Day(s) from this visit.  Latest known visit with results is:  Office Visit on 05/09/2017  Component Date Value Ref Range Status  . b2a2 transcript 05/09/2017 Comment  % Final   Comment: (NOTE)           <0.0032 % (sensitivity limit of assay)   . b3a2 transcript 05/09/2017 Comment  % Final   Comment: (NOTE)           <0.0032 % (sensitivity limit of assay)   . E1A2 Transcript 05/09/2017 Comment  % Final   Comment: (NOTE)           <0.0032 % (sensitivity limit of assay)   . Interpretation (BCRAL): 05/09/2017 Comment   Final   Comment: (NOTE) NEGATIVE for the BCR-ABL1 e1a2 (p190), e13a2 (b2a2, p210) and e14a2 (b3a2, p210) fusion transcripts. These results do not rule out the presence of rare BCR-ABL1 transcripts not detected by this assay.   . Director Review (BCRAL): 05/09/2017 Comment   Final   Comment: (NOTE) Katina Degree, MD, PhD Director, [facility] [place], [place] [address] [phone]   . Background: 05/09/2017 Comment   Corrected   Comment:  (NOTE) This assay can detect three different types of BCR-ABL1 fusion transcripts associated with CML, ALL, and AML: e13a2 (previously b2a2) and e14a2 (previously b3a2) (major breakpoint,  p210), as well as e1a2 (minor breakpoint, p190). The e13a2 and e14a2 transcript values are titrated to the current International Scale (IS). The standardized baseline is 100% BCR-ABL1 (IS) and major molecular response (MMR) is equivalent to 0.1% BCR-ABL1 (IS) corresponding to a 3-log reduction. Results should be correlated with appropriate clinical and laboratory information as indicated.   . Methodology 05/09/2017 Comment   Corrected   Comment: (NOTE) Total RNA is isolated from the sample and subject to a real-time, reverse transcriptase polymerase chain reaction (RT-PCR). The PCR primers and probes are specific for BCR-ABL1 e13a2, e14a2 and e1a2 fusion transcripts. The ABL1 transcript is amplified as the control for cDNA quantity and quality. Serial dilutions of a validated positive control RNA with known t(9;22) BCR-ABL1 are used as reference for quantification of BCR-ABL1 relative to ABL1. The numeric BCR-ABL1 level is reportd as % BCR-ABL1/ABL1 and the detection sensitivity is 4.5 log below the standard baseline. This test was developed and its performance characteristics determined by [facility]. It has not been cleared or approved by the Food and Drug Administration. References:    1. Anastasia Fiedler and Branford S: Seminars in Hematology 2003;       40 (suppl2):62-68.    2. White HE, et al. Blood 2010; 116: e111-117.    3. NCCN Clinical Practice Guidelines in Oncology, Chronic       Myeloid Leukemia. V2. 2017.                           Performed At: [facility] [address] [place], [place] [id number] Nechama Guard MD YN:[id number] Performed At: [facility] [place], [place] [id number] Nechama Guard MD XB:[id number] Performed at [facility], [address], [place], [place] [address]   . CRP 05/09/2017 <0.8  <1.0 mg/dL Final   Performed at [facility] [address] [place], [place] [address]  . Sed Rate 05/09/2017 55* 0 - 22 mm/hr Final   Performed at [facility], [address] [place],  [address]  . LDH 05/09/2017 154  98 - 192 U/L Final   Performed at [facility], [address], [place],  [address]  . Sodium 05/09/2017 136  135 - 145 mmol/L Final  . Potassium 05/09/2017 3.8  3.5 - 5.1 mmol/L Final  . Chloride 05/09/2017 103  101 - 111 mmol/L Final  . CO2 05/09/2017 25  22 - 32 mmol/L Final  . Glucose, Bld 05/09/2017 102* 65 - 99 mg/dL Final  . BUN 05/09/2017 19  6 - 20 mg/dL Final  . Creatinine, Ser 05/09/2017 1.09* 0.44 - 1.00 mg/dL Final  . Calcium 05/09/2017 9.1  8.9 - 10.3 mg/dL Final  . Total Protein 05/09/2017 6.8  6.5 - 8.1 g/dL Final  . Albumin 05/09/2017 2.7* 3.5 - 5.0 g/dL Final  . AST 05/09/2017 25  15 - 41 U/L Final  . ALT 05/09/2017 14  14 - 54 U/L Final  . Alkaline Phosphatase 05/09/2017 90  38 - 126 U/L Final  . Total Bilirubin 05/09/2017 0.5  0.3 - 1.2 mg/dL Final  . GFR calc non Af Amer 05/09/2017 44* >60 mL/min Final  . GFR calc Af Amer 05/09/2017 51* >60 mL/min Final   Comment: (NOTE) The eGFR has been calculated using the CKD EPI equation. This calculation has not been validated in all clinical situations. eGFR's persistently <60 mL/min signify possible Chronic Kidney Disease.   . Anion gap  05/09/2017 8  5 - 15 Final   Performed at [facility], [address] [place], [place] [address]  . WBC 05/09/2017 19.1* 4.0 - 10.5 K/uL Final  . RBC 05/09/2017 3.30* 3.87 - 5.11 MIL/uL Final  . Hemoglobin 05/09/2017 9.9* 12.0 - 15.0 g/dL Final  . HCT 05/09/2017 32.2* 36.0 - 46.0 % Final  . MCV 05/09/2017 97.6  78.0 - 100.0 fL Final  . MCH 05/09/2017 30.0  26.0 - 34.0 pg Final  . MCHC 05/09/2017 30.7  30.0 - 36.0 g/dL Final  . RDW 05/09/2017 13.6  11.5 - 15.5 % Final  . Platelets 05/09/2017  165  150 - 400 K/uL Final  . Neutrophils Relative % 05/09/2017 72  % Final  . Lymphocytes Relative 05/09/2017 18  % Final  . Monocytes Relative 05/09/2017 6  % Final  . Eosinophils Relative 05/09/2017 4  % Final  . Basophils Relative 05/09/2017 0  % Final  . Neutro Abs 05/09/2017 13.8* 1.7 - 7.7 K/uL Final  . Lymphs Abs 05/09/2017 3.4  0.7 - 4.0 K/uL Final  . Monocytes Absolute 05/09/2017 1.1* 0.1 - 1.0 K/uL Final  . Eosinophils Absolute 05/09/2017 0.8* 0.0 - 0.7 K/uL Final  . Basophils Absolute 05/09/2017 0.0  0.0 - 0.1 K/uL Final  . WBC Morphology 05/09/2017 MODERATE LEFT SHIFT (>5% METAS AND MYELOS,OCC PRO NOTED)   Final   Comment: VACUOLATED NEUTROPHILS SLIGHT TOXIC GRANULATION Performed at [facility], [address], [place], [place] [address]      Pathology Orders Placed This Encounter  Procedures  . CBC with Differential/Platelet    Standing Status:   Future    Standing Expiration Date:   05/24/2018  . Comprehensive metabolic panel    Standing Status:   Future    Standing Expiration Date:   05/24/2018  . Lactate dehydrogenase    Standing Status:   Future    Standing Expiration Date:   05/24/2018       Zoila Shutter MD

## 2017-05-29 IMAGING — CR DG CHEST 2V
2 series · 2 of 2 positions shown · non-contrast
Comparison: Chest x-ray of 09/08/2013

CLINICAL DATA: Preop for disc surgery

EXAM:
CHEST  2 VIEW

[w chest pa]
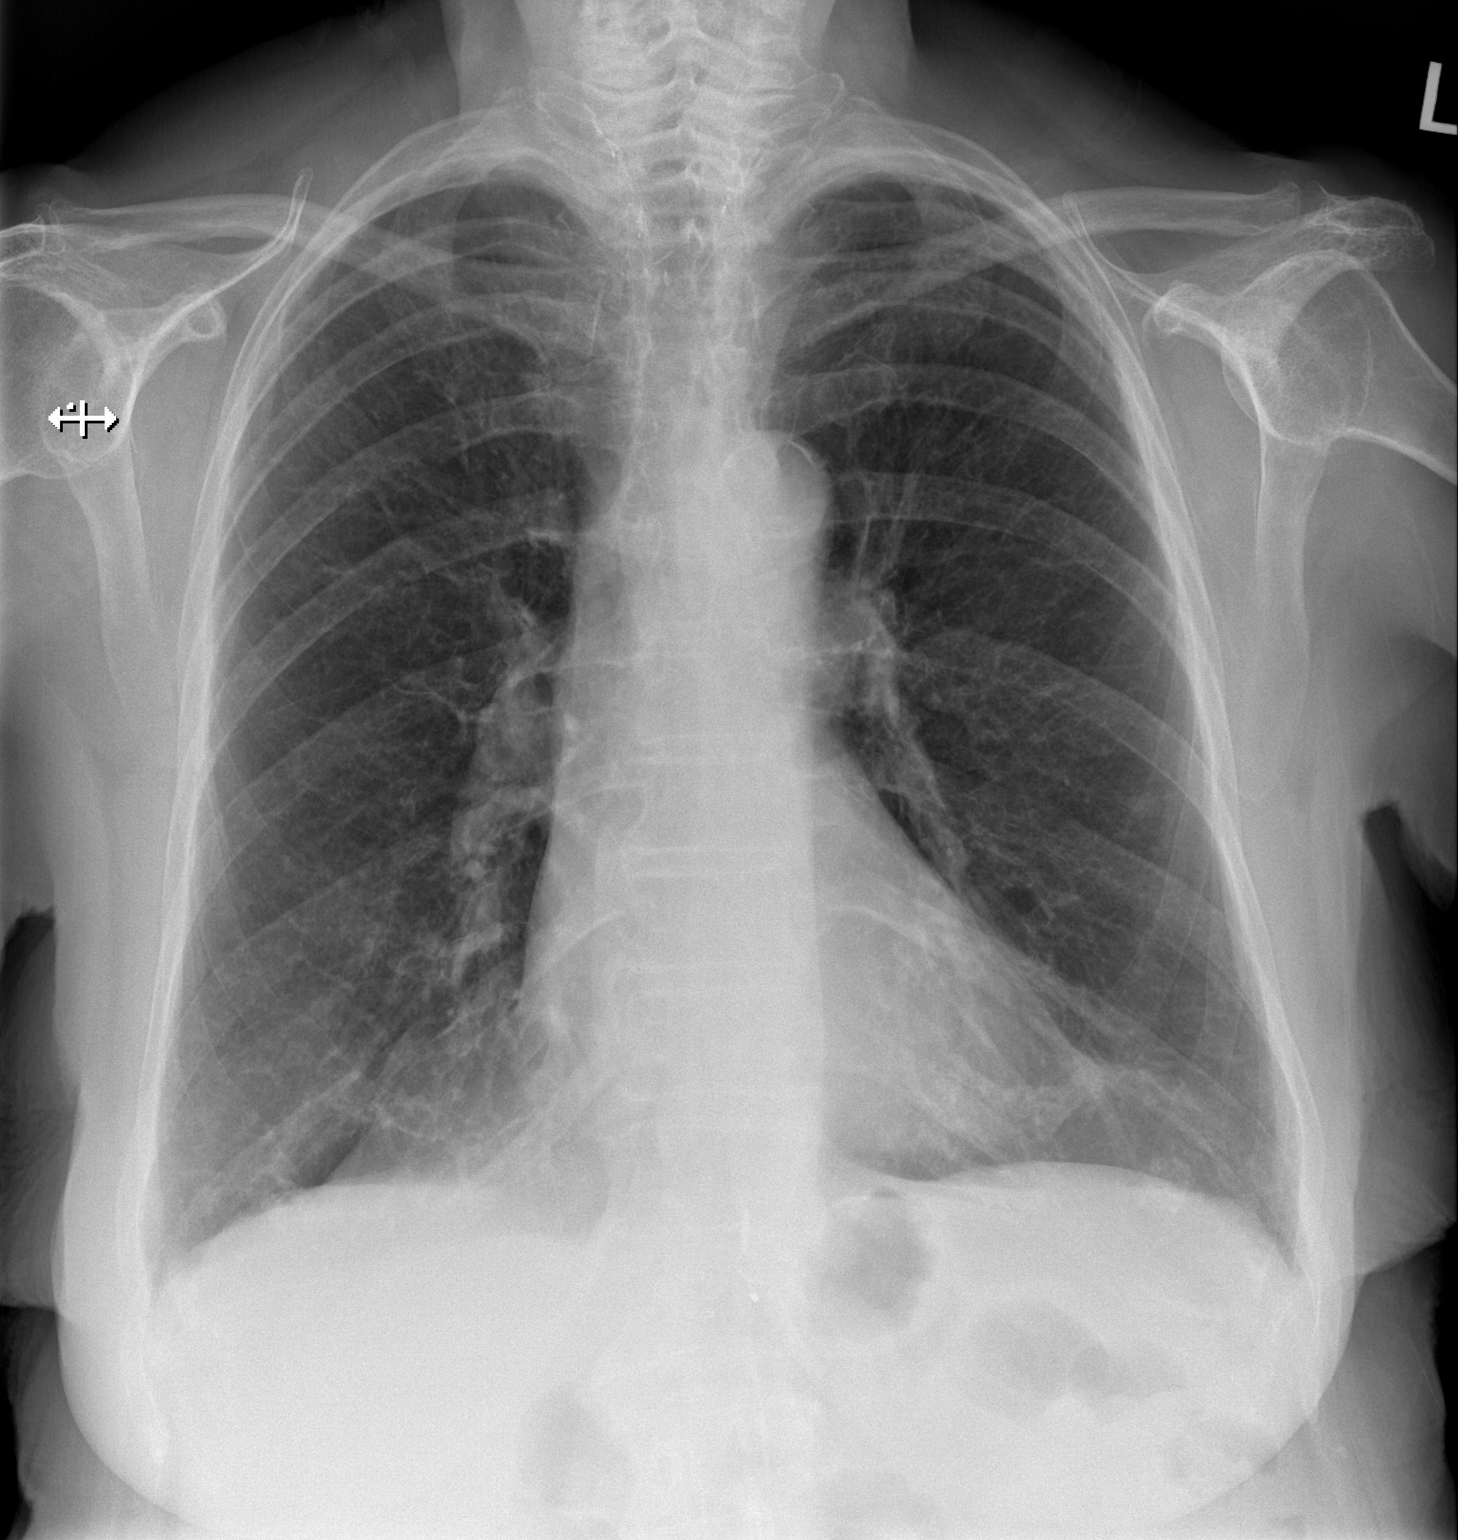

[w chest lat]
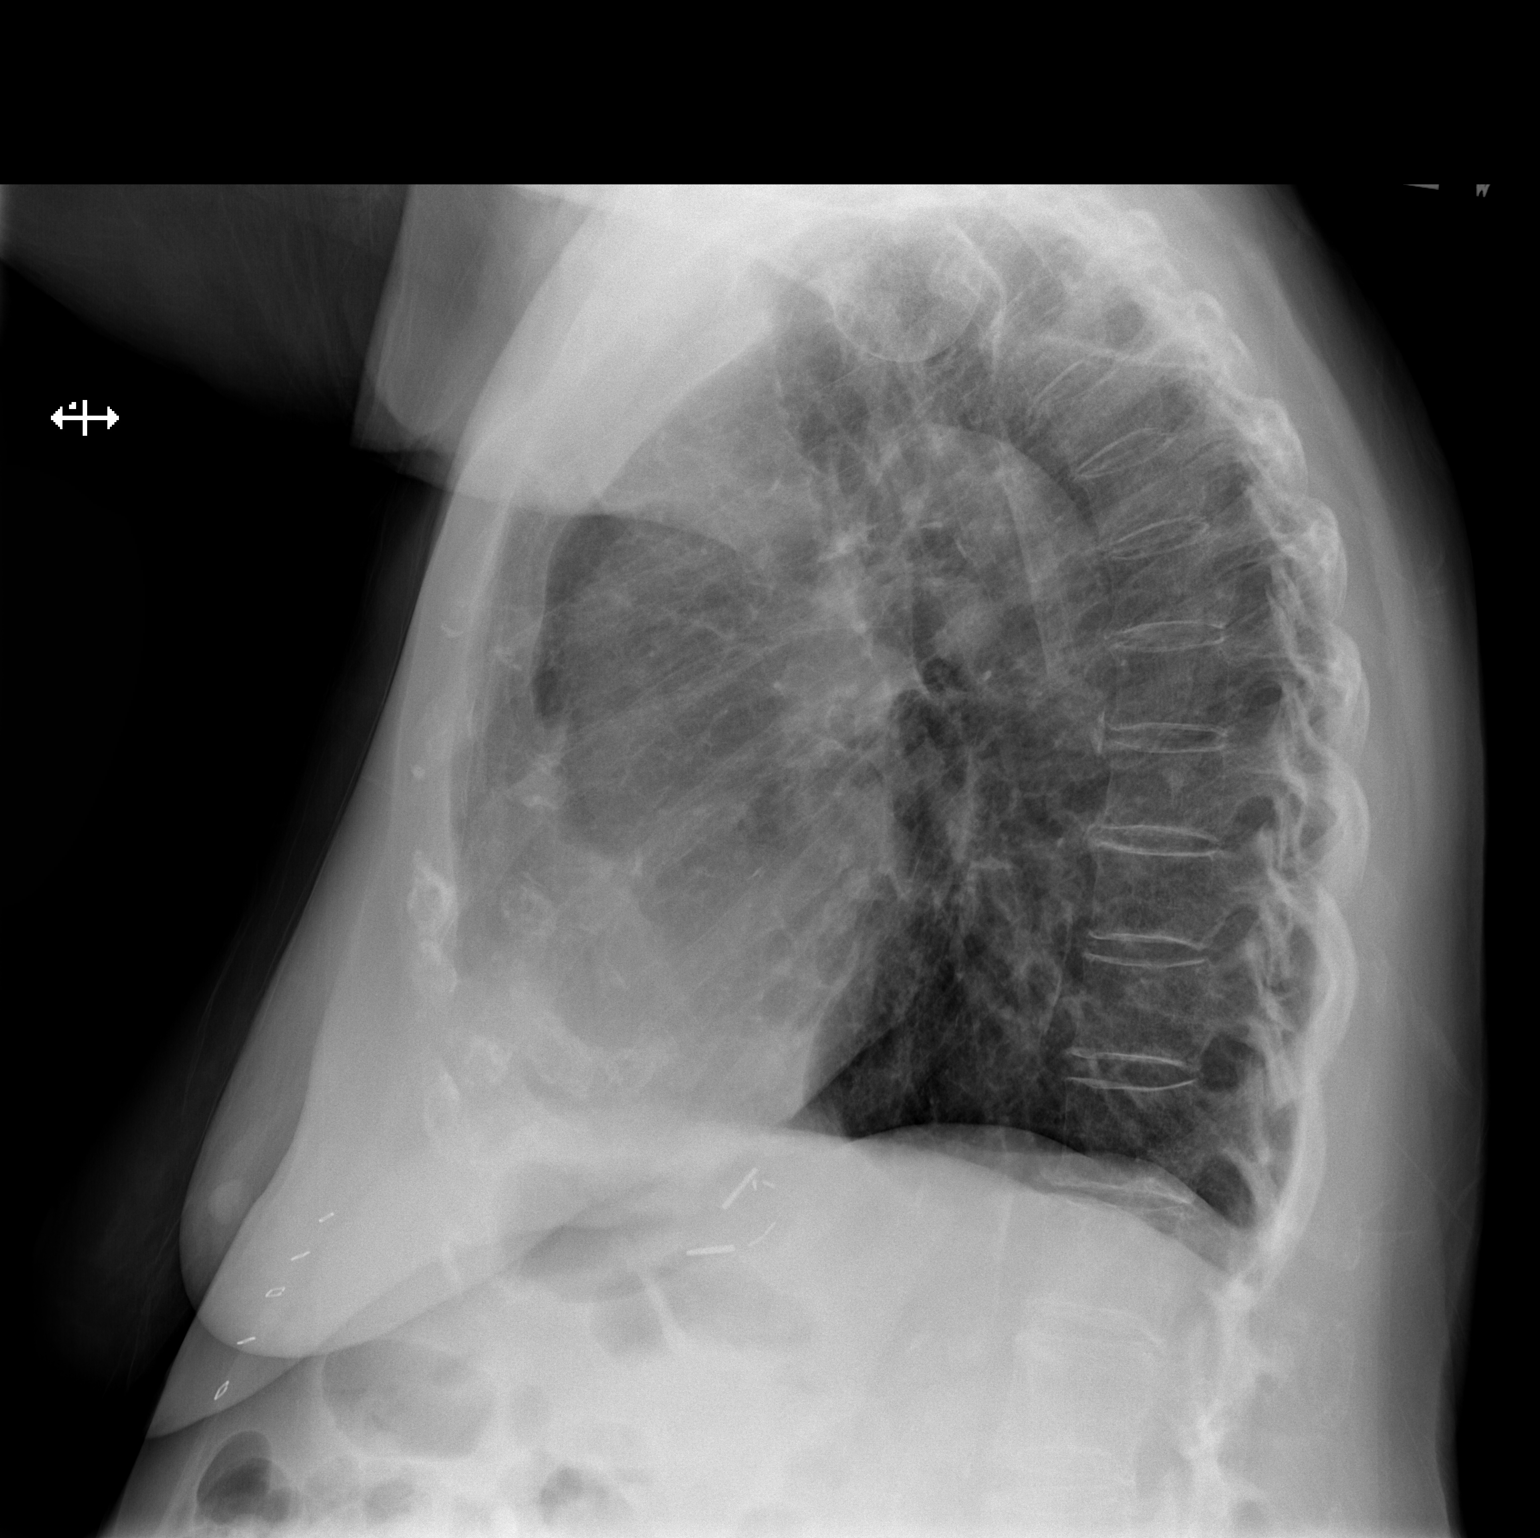

[2 of 2 positions shown; findings below may reference images not displayed]

FINDINGS: The opacity on the lateral view in the region of the lingula has
resolved. Minimal scarring is noted at the left lung base. The right
lung is clear. No effusion is seen. Mediastinal and hilar contours
are unremarkable. The heart is within normal limits in size.
IMPRESSION: Minimal scarring in the lingula. Much of the opacity in the lingula
has resolved. No definite active process is seen.

## 2017-06-02 NOTE — Progress Notes (Signed)
Diagnosis Other elevated white blood cell (WBC) count - Plan: CBC with Differential/Platelet, Comprehensive metabolic panel, Lactate dehydrogenase, Sedimentation rate, C-reactive protein, BCR-ABL1, CML/ALL, PCR, QUANT, C-reactive protein, Sedimentation rate, Lactate dehydrogenase, Comprehensive metabolic panel, CBC with Differential/Platelet  Staging Cancer Staging No matching staging information was found for the patient.  Assessment and Plan:  1.  Leukocytosis.  84 year old female with a history of persistent leukocytosis.  She reportedly has undergone bone marrow biopsies in the past in [place].  She reports she is also had her spleen removed but denies any involvement by lymphoma.  She had recent labs done on April 12, 2017 and showed a white count of 21.2 hemoglobin was 10.4 platelets 211,000.  Urine culture showed mixed bacterial flora.  Pt denies fevers, chills, night sweats and has noted no adenopathy.  She is seen today for consultation due to leukocytosis.  Labs done today 05/09/2017 show WBC 19, HB 9.9 Plts 165,000.  BCR?ABL negative.  We will contact pathology to determine if prior bone marrow biopsy can be obtained.  Patient currently does not desire to undergo repeat procedure.  She will return to clinic in 3-4 weeks to go over the remainder of her labs.  She remains asymptomatic currently.  2.  ITP,  Platelet count is normal at 165,000.  Will contact pathology  for results of surgical pathology regarding reported prior splenectomy.    3.  Hypertension.  BP is elevated at 143/116.  Follow-up with PCP.    HPI:  84 year old female with a history of persistent leukocytosis.  She reportedly has undergone bone marrow biopsies in the past in [place].  She reports she is also had her spleen removed but denies any involvement by lymphoma.  She had recent labs done on April 12, 2017 and showed a white count of 21.2 hemoglobin was 10.4 platelets 211,000.  Urine culture showed mixed  bacterial flora.  Pt denies fevers, chills, night sweats and has noted no adenopathy.  She is seen today for consultation due to leukocytosis.  Problem List Patient Active Problem List   Diagnosis Date Noted  . Confusion and disorientation [F99] 02/01/2017  . UTI (urinary tract infection) [N39.0] 02/01/2017  . AKI (acute kidney injury) ([place]) [N17.9] 02/01/2017  . Lumbar radiculopathy [M54.16] 03/14/2016  . HNP (herniated nucleus pulposus), cervical [M50.20] 05/16/2015    Class: Chronic  . Herniated nucleus pulposus, lumbar [M51.26] 05/16/2015  . Abnormal EKG [R94.31] 04/21/2015  . Thrombocytopenia ([place]) [D69.6] 08/18/2014  . Insomnia [G47.00] 11/24/2013  . Impingement syndrome of left shoulder [M75.42] 09/18/2013  . S/P arthroscopy of shoulder [Z98.890] 09/18/2013  . Low back pain [M54.5] 03/17/2013  . Neuropathy [G62.9] 03/17/2013  . Metabolic syndrome [M19.62] 03/17/2013  . Overweight (BMI 25.0-29.9) [E66.3] 03/17/2013  . Vitamin D deficiency [E55.9] 12/01/2012  . Chronic insomnia [F51.04] 12/01/2012  . Fibrocystic breast changes [N60.19] 05/21/2011  . Essential hypertension [I10]   . Hyperlipidemia [E78.5]   . Peptic ulcer disease [K27.9]   . ITP (idiopathic thrombocytopenic purpura) [D69.3]   . Rhinitis [J31.0]   . Elevated blood sugar [R73.9]   . Hypothyroidism [E03.9]   . Goiter [E04.9]   . Colon polyps [K63.5]   . Adhesion of abdominal wall [K66.0]   . Esophageal stricture [K22.2]     Past Medical History Past Medical History:  Diagnosis Date  . Adhesion of abdominal wall    "continues to have abdominal pain pain intermittent right abdominal side. "scar tissue"  . Arthritis   . Breast cyst   .  Bronchitis, asthmatic    'bronchitis that will go into an asthma like attack but haven't had it in years'  . Colon polyps   . Elevated blood sugar   . Esophageal stricture    "occ. problems with swallowing water"  . Full dentures   . GI bleed   . Goiter   . History  of blood transfusion   . History of hiatal hernia    repaired over 20 years ago  . ITP (idiopathic thrombocytopenic purpura)    not a problem now.  . Other and unspecified hyperlipidemia   . Peptic ulcer disease   . Rhinitis   . Unspecified essential hypertension   . Unspecified hypothyroidism     Past Surgical History Past Surgical History:  Procedure Laterality Date  . ABDOMINAL HYSTERECTOMY    . APPENDECTOMY    . BACK SURGERY     x1 Lumbar, x2 cervical anterior and posterior .  Marland Kitchen BREAST SURGERY     x3 biopsy  . cataract surgery Bilateral   . CHOLECYSTECTOMY    . EYE SURGERY Bilateral 2007   cataracts   . HEMORRHOID SURGERY    . HERNIA REPAIR    . IR RADIOLOGIST EVAL & MGMT  12/24/2016  . IR SACROPLASTY BILATERAL  12/28/2016  . JOINT REPLACEMENT    . LUMBAR LAMINECTOMY N/A 05/16/2015   Procedure: MICRODISCECTOMY Lumbar 3-Lumbar 4;  Surgeon: Jessy Oto, MD;  Location: [facility];  Service: Orthopedics;  Laterality: N/A;  . 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 THYROID SURGERY     Total "goiter removal"  . TOTAL KNEE ARTHROPLASTY Right 2010    Family History Family History  Problem Relation Age of Onset  . Lung cancer Father   . Heart disease Mother        Unkown.  Died age 41  . Emphysema Sister      Social History  reports that she is a non-smoker but has been exposed to tobacco smoke. The exposure started about 72 years ago. She has never used smokeless tobacco. She reports that she does not drink alcohol or use drugs.  Medications  Current Outpatient Medications:  .  atorvastatin (LIPITOR) 80 MG tablet, TAKE 1 TABLET ONCE A DAY, Disp: 30 tablet, Rfl: 5 .  calcium citrate-vitamin D (CITRACAL+D) 315-200 MG-UNIT per tablet, Take 1 tablet by mouth daily., Disp: , Rfl:  .  Cholecalciferol (VITAMIN D3) 2000 UNITS TABS, Take 1 tablet by mouth daily.  , Disp: , Rfl:  .  DULoxetine (CYMBALTA) 30 MG capsule, Take 3 capsules by mouth daily., Disp: ,  Rfl: 1 .  DULoxetine (CYMBALTA) 60 MG capsule, Take 60 mg by mouth every morning., Disp: , Rfl:  .  fluticasone (FLONASE) 50 MCG/ACT nasal spray, Place 2 sprays into both nostrils daily., Disp: 16 g, Rfl: 6 .  gabapentin (NEURONTIN) 400 MG capsule, Take 1 capsule by mouth daily., Disp: , Rfl: 1 .  levothyroxine (SYNTHROID, LEVOTHROID) 125 MCG tablet, Take 1 tablet (125 mcg total) by mouth daily before breakfast., Disp: , Rfl:  .  levothyroxine (SYNTHROID, LEVOTHROID) 150 MCG tablet, , Disp: , Rfl: 2 .  meclizine (ANTIVERT) 25 MG tablet, TAKE 1/2 TABLET 3 TIMES A DAY AS NEEDED FOR DIZZINESS, Disp: 30 tablet, Rfl: 2 .  meclizine (ANTIVERT) 25 MG tablet, TAKE 1/2 TABLET 3 TIMES A DAY AS NEEDED FOR DIZZINESS, Disp: 30 tablet, Rfl: 0 .  Multiple Vitamin (MULTIVITAMIN WITH MINERALS) TABS tablet, Take  1 tablet by mouth daily., Disp: , Rfl:  .  polyvinyl alcohol (LIQUIFILM TEARS) 1.4 % ophthalmic solution, Place 1 drop into both eyes as needed for dry eyes., Disp: , Rfl:  .  potassium chloride (K-DUR) 10 MEQ tablet, Take 1 tablet (10 mEq total) by mouth daily., Disp: 30 tablet, Rfl: 2 .  QUEtiapine (SEROQUEL) 50 MG tablet, , Disp: , Rfl: 1 .  valsartan-hydrochlorothiazide (DIOVAN-HCT) 320-25 MG tablet, TAKE 1 TABLET DAILY, Disp: 30 tablet, Rfl: 3  Current Facility-Administered Medications:  .  calcitonin (salmon) (MIACALCIN/FORTICAL) nasal spray 1 spray, 1 spray, Alternating Nares, Daily, Jessy Oto, MD  Allergies Prednisone  Review of Systems Review of Systems - Oncology ROS as per HPI otherwise 12 point ROS is negative.   Physical Exam  Vitals Wt Readings from Last 3 Encounters:  05/23/17 145 lb 11.2 oz (66.1 kg)  05/09/17 145 lb 4.8 oz (65.9 kg)  04/09/17 143 lb (64.9 kg)   Temp Readings from Last 3 Encounters:  05/23/17 98 F (36.7 C) (Oral)  05/09/17 98.4 F (36.9 C) (Oral)  04/09/17 (!) 97 F (36.1 C) (Oral)   BP Readings from Last 3 Encounters:  05/23/17 (!) 130/51   05/09/17 (!) 143/116  04/09/17 127/63   Pulse Readings from Last 3 Encounters:  05/23/17 77  05/09/17 94  04/09/17 82   Constitutional: Well-developed, well-nourished, and in no distress.   HENT: Head: Normocephalic and atraumatic.  Mouth/Throat: No oropharyngeal exudate. Mucosa moist. Eyes: Pupils are equal, round, and reactive to light. Conjunctivae are normal. No scleral icterus.  Neck: Normal range of motion. Neck supple. No JVD present.  Cardiovascular: Normal rate, regular rhythm and normal heart sounds.  Exam reveals no gallop and no friction rub.   No murmur heard. Pulmonary/Chest: Effort normal and breath sounds normal. No respiratory distress. No wheezes.No rales.  Abdominal: Soft. Bowel sounds are normal. No distension. There is no tenderness. There is no guarding.  Musculoskeletal: No edema or tenderness.  Lymphadenopathy: No cervical, axillry, or supraclavicular adenopathy.  Neurological: Alert and oriented to person, place, and time. No cranial nerve deficit.  Skin: Skin is warm and dry. No rash noted. No erythema. No pallor.  Psychiatric: Affect and judgment normal.   Labs Office Visit on 05/09/2017  Component Date Value Ref Range Status  . b2a2 transcript 05/09/2017 Comment  % Final   Comment: (NOTE)           <0.0032 % (sensitivity limit of assay)   . b3a2 transcript 05/09/2017 Comment  % Final   Comment: (NOTE)           <0.0032 % (sensitivity limit of assay)   . E1A2 Transcript 05/09/2017 Comment  % Final   Comment: (NOTE)           <0.0032 % (sensitivity limit of assay)   . Interpretation (BCRAL): 05/09/2017 Comment   Final   Comment: (NOTE) NEGATIVE for the BCR-ABL1 e1a2 (p190), e13a2 (b2a2, p210) and e14a2 (b3a2, p210) fusion transcripts. These results do not rule out the presence of rare BCR-ABL1 transcripts not detected by this assay.   . Director Review (BCRAL): 05/09/2017 Comment   Final   Comment: (NOTE) Katina Degree, MD, PhD Director,  [facility] [place], [place] [address] [phone]   . Background: 05/09/2017 Comment   Corrected   Comment: (NOTE) This assay can detect three different types of BCR-ABL1 fusion transcripts associated with CML, ALL, and AML: e13a2 (previously b2a2) and  e14a2 (previously b3a2) (major breakpoint, p210), as well as e1a2 (minor breakpoint, p190). The e13a2 and e14a2 transcript values are titrated to the current International Scale (IS). The standardized baseline is 100% BCR-ABL1 (IS) and major molecular response (MMR) is equivalent to 0.1% BCR-ABL1 (IS) corresponding to a 3-log reduction. Results should be correlated with appropriate clinical and laboratory information as indicated.   . Methodology 05/09/2017 Comment   Corrected   Comment: (NOTE) Total RNA is isolated from the sample and subject to a real-time, reverse transcriptase polymerase chain reaction (RT-PCR). The PCR primers and probes are specific for BCR-ABL1 e13a2, e14a2 and e1a2 fusion transcripts. The ABL1 transcript is amplified as the control for cDNA quantity and quality. Serial dilutions of a validated positive control RNA with known t(9;22) BCR-ABL1 are used as reference for quantification of BCR-ABL1 relative to ABL1. The numeric BCR-ABL1 level is reportd as % BCR-ABL1/ABL1 and the detection sensitivity is 4.5 log below the standard baseline. This test was developed and its performance characteristics determined by [facility]. It has not been cleared or approved by the Food and Drug Administration. References:    1. Anastasia Fiedler and Branford S: Seminars in Hematology 2003;       40 (suppl2):62-68.    2. White HE, et al. Blood 2010; 116: e111-117.    3. NCCN Clinical Practice Guidelines in Oncology, Chronic       Myeloid Leukemia. V2. 2017.                           Performed At: [facility] [address] [place], [place] [id number] Nechama Guard MD QP:[id number] Performed At: [facility] [place], [place] [id number] Nechama Guard MD PJ:[id number] Performed at [facility], [address] [place], [place] [address]   . CRP 05/09/2017 <0.8  <1.0 mg/dL Final   Performed at [facility] [address], [place], [place] [address]  . Sed Rate 05/09/2017 55* 0 - 22 mm/hr Final   Performed at [facility], [address], [place], [place] [address]  . LDH 05/09/2017 154  98 - 192 U/L Final   Performed at [facility], [address] [place],  [address]  . Sodium 05/09/2017 136  135 - 145 mmol/L Final  . Potassium 05/09/2017 3.8  3.5 - 5.1 mmol/L Final  . Chloride 05/09/2017 103  101 - 111 mmol/L Final  . CO2 05/09/2017 25  22 - 32 mmol/L Final  . Glucose, Bld 05/09/2017 102* 65 - 99 mg/dL Final  . BUN 05/09/2017 19  6 - 20 mg/dL Final  . Creatinine, Ser 05/09/2017 1.09* 0.44 - 1.00 mg/dL Final  . Calcium 05/09/2017 9.1  8.9 - 10.3 mg/dL Final  . Total Protein 05/09/2017 6.8  6.5 - 8.1 g/dL Final  . Albumin 05/09/2017 2.7* 3.5 - 5.0 g/dL Final  . AST 05/09/2017 25  15 - 41 U/L Final  . ALT 05/09/2017 14  14 - 54 U/L Final  . Alkaline Phosphatase 05/09/2017 90  38 - 126 U/L Final  . Total Bilirubin 05/09/2017 0.5  0.3 - 1.2 mg/dL Final  . GFR calc non Af Amer 05/09/2017 44* >60 mL/min Final  . GFR calc Af Amer 05/09/2017 51* >60 mL/min Final   Comment: (NOTE) The eGFR has been calculated using the CKD EPI equation. This calculation has not been validated in all clinical situations. eGFR's persistently <60 mL/min signify possible Chronic Kidney Disease.   Marland Kitchen  Anion gap 05/09/2017 8  5 - 15 Final   Performed at [facility], [address] [place], [place] [address]  . WBC 05/09/2017 19.1* 4.0 - 10.5 K/uL Final  . RBC 05/09/2017 3.30* 3.87 - 5.11 MIL/uL Final  . Hemoglobin 05/09/2017 9.9* 12.0 - 15.0 g/dL Final  . HCT 05/09/2017 32.2* 36.0 - 46.0 % Final  . MCV 05/09/2017 97.6   78.0 - 100.0 fL Final  . MCH 05/09/2017 30.0  26.0 - 34.0 pg Final  . MCHC 05/09/2017 30.7  30.0 - 36.0 g/dL Final  . RDW 05/09/2017 13.6  11.5 - 15.5 % Final  . Platelets 05/09/2017 165  150 - 400 K/uL Final  . Neutrophils Relative % 05/09/2017 72  % Final  . Lymphocytes Relative 05/09/2017 18  % Final  . Monocytes Relative 05/09/2017 6  % Final  . Eosinophils Relative 05/09/2017 4  % Final  . Basophils Relative 05/09/2017 0  % Final  . Neutro Abs 05/09/2017 13.8* 1.7 - 7.7 K/uL Final  . Lymphs Abs 05/09/2017 3.4  0.7 - 4.0 K/uL Final  . Monocytes Absolute 05/09/2017 1.1* 0.1 - 1.0 K/uL Final  . Eosinophils Absolute 05/09/2017 0.8* 0.0 - 0.7 K/uL Final  . Basophils Absolute 05/09/2017 0.0  0.0 - 0.1 K/uL Final  . WBC Morphology 05/09/2017 MODERATE LEFT SHIFT (>5% METAS AND MYELOS,OCC PRO NOTED)   Final   Comment: VACUOLATED NEUTROPHILS SLIGHT TOXIC GRANULATION Performed at [facility], [address], [place], [place] [address]      Pathology Orders Placed This Encounter  Procedures  . CBC with Differential/Platelet    Standing Status:   Future    Number of Occurrences:   1    Standing Expiration Date:   05/09/2018  . Comprehensive metabolic panel    Standing Status:   Future    Number of Occurrences:   1    Standing Expiration Date:   05/09/2018  . Lactate dehydrogenase    Standing Status:   Future    Number of Occurrences:   1    Standing Expiration Date:   05/09/2018  . Sedimentation rate    Standing Status:   Future    Number of Occurrences:   1    Standing Expiration Date:   05/09/2018  . C-reactive protein    Standing Status:   Future    Number of Occurrences:   1    Standing Expiration Date:   05/09/2018  . BCR-ABL1, CML/ALL, PCR, QUANT       Zoila Shutter MD

## 2017-06-17 ENCOUNTER — Other Ambulatory Visit: Payer: Self-pay | Admitting: *Deleted

## 2017-06-17 MED ORDER — VALSARTAN-HYDROCHLOROTHIAZIDE 320-25 MG PO TABS: 1.0000 | ORAL_TABLET | Freq: Every day | ORAL | 0 refills | Status: DC

## 2017-06-21 ENCOUNTER — Other Ambulatory Visit: Payer: Self-pay | Admitting: Family Medicine

## 2017-06-24 ENCOUNTER — Other Ambulatory Visit: Payer: Self-pay | Admitting: Family Medicine

## 2017-06-26 ENCOUNTER — Other Ambulatory Visit: Payer: Self-pay | Admitting: *Deleted

## 2017-06-26 MED ORDER — ATORVASTATIN CALCIUM 80 MG PO TABS: 80.0000 mg | ORAL_TABLET | Freq: Every day | ORAL | 0 refills | Status: DC

## 2017-07-01 ENCOUNTER — Other Ambulatory Visit: Payer: Self-pay | Admitting: Family Medicine

## 2017-07-04 DIAGNOSIS — F4323 Adjustment disorder with mixed anxiety and depressed mood: Secondary | ICD-10-CM | POA: Diagnosis not present

## 2017-07-10 ENCOUNTER — Encounter: Payer: Self-pay | Admitting: Family Medicine

## 2017-07-10 ENCOUNTER — Ambulatory Visit (INDEPENDENT_AMBULATORY_CARE_PROVIDER_SITE_OTHER): Payer: Medicare Other | Admitting: Family Medicine

## 2017-07-10 VITALS — BP 134/70 | HR 73 | Temp 98.0°F | Ht 65.0 in | Wt 154.0 lb

## 2017-07-10 DIAGNOSIS — E559 Vitamin D deficiency, unspecified: Secondary | ICD-10-CM

## 2017-07-10 DIAGNOSIS — D696 Thrombocytopenia, unspecified: Secondary | ICD-10-CM

## 2017-07-10 DIAGNOSIS — E7800 Pure hypercholesterolemia, unspecified: Secondary | ICD-10-CM | POA: Diagnosis not present

## 2017-07-10 DIAGNOSIS — E039 Hypothyroidism, unspecified: Secondary | ICD-10-CM

## 2017-07-10 DIAGNOSIS — I10 Essential (primary) hypertension: Secondary | ICD-10-CM | POA: Diagnosis not present

## 2017-07-10 DIAGNOSIS — D72829 Elevated white blood cell count, unspecified: Secondary | ICD-10-CM

## 2017-07-10 NOTE — Progress Notes (Signed)
Subjective:    Patient ID: [name], female    DOB: [birth year], 84 y.o.   MRN: [id number]  HPI Pt here for follow up and management of chronic medical problems which includes hypertension and hyperlipidemia. She is taking medication regularly.  This patient today is doing well with no specific complaints.  She did have a hospitalization back in the late fall because of urinary tract infection affecting her causing confusion.  She was treated with Cipro and discharged.  She is also referred to the hematologist because of her persistent leukocytosis.  This was more recently.  Follow-up visit with Dr. Walden Field in March indicated that a bone marrow biopsy was discussed.  She had previously had a bone marrow biopsy many years ago.  No definitive treatment was recommended at this time but a follow-up visit was scheduled sometime a couple months after the previous visit which would be sometime in May or June.  The patient's vital signs today are stable and she will be given an FOBT to return and will get routine lab work and we will make sure that the hematologist gets a copy of this report.  The patient denies any chest pain or shortness of breath.  She does have occasional trouble swallowing.  This is not constant and is not getting any worse.  She denies any blood in the stool or black tarry bowel movements and denies nausea vomiting or diarrhea.  She is currently passing her water well and trying to drink plenty of fluids.  She uses her walker regularly around the house to keep from falling.  She is going to be celebrating her 89th birthday on the 21st of this month.   Patient Active Problem List   Diagnosis Date Noted  . Confusion and disorientation 02/01/2017  . UTI (urinary tract infection) 02/01/2017  . AKI (acute kidney injury) ([place]) 02/01/2017  . Lumbar radiculopathy 03/14/2016  . HNP (herniated nucleus pulposus), cervical 05/16/2015    Class: Chronic  . Herniated nucleus pulposus, lumbar  05/16/2015  . Abnormal EKG 04/21/2015  . Thrombocytopenia ([place]) 08/18/2014  . Insomnia 11/24/2013  . Impingement syndrome of left shoulder 09/18/2013  . S/P arthroscopy of shoulder 09/18/2013  . Low back pain 03/17/2013  . Neuropathy 03/17/2013  . Metabolic syndrome 40/97/3532  . Overweight (BMI 25.0-29.9) 03/17/2013  . Vitamin D deficiency 12/01/2012  . Chronic insomnia 12/01/2012  . Fibrocystic breast changes 05/21/2011  . Essential hypertension   . Hyperlipidemia   . Peptic ulcer disease   . ITP (idiopathic thrombocytopenic purpura)   . Rhinitis   . Elevated blood sugar   . Hypothyroidism   . Goiter   . Colon polyps   . Adhesion of abdominal wall   . Esophageal stricture    Outpatient Encounter Medications as of 07/10/2017  Medication Sig  . atorvastatin (LIPITOR) 80 MG tablet Take 1 tablet (80 mg total) by mouth daily.  . calcium citrate-vitamin D (CITRACAL+D) 315-200 MG-UNIT per tablet Take 1 tablet by mouth daily.  . Cholecalciferol (VITAMIN D3) 2000 UNITS TABS Take 1 tablet by mouth daily.    . DULoxetine (CYMBALTA) 60 MG capsule Take 60 mg by mouth every morning.  . fluticasone (FLONASE) 50 MCG/ACT nasal spray Place 2 sprays into both nostrils daily.  Marland Kitchen gabapentin (NEURONTIN) 400 MG capsule Take 1 capsule by mouth daily.  Marland Kitchen levothyroxine (SYNTHROID, LEVOTHROID) 125 MCG tablet Take 1 tablet (125 mcg total) by mouth daily before breakfast.  . meclizine (ANTIVERT) 25 MG tablet TAKE  1/2 TABLET 3 TIMES A DAY AS NEEDED FOR DIZZINESS  . Multiple Vitamin (MULTIVITAMIN WITH MINERALS) TABS tablet Take 1 tablet by mouth daily.  . polyvinyl alcohol (LIQUIFILM TEARS) 1.4 % ophthalmic solution Place 1 drop into both eyes as needed for dry eyes.  . potassium chloride (K-DUR) 10 MEQ tablet Take 1 tablet (10 mEq total) by mouth daily.  . QUEtiapine (SEROQUEL) 50 MG tablet   . valsartan-hydrochlorothiazide (DIOVAN-HCT) 320-25 MG tablet TAKE 1 TABLET DAILY  . [DISCONTINUED] DULoxetine  (CYMBALTA) 30 MG capsule Take 3 capsules by mouth daily.  . [DISCONTINUED] levothyroxine (SYNTHROID, LEVOTHROID) 150 MCG tablet   . [DISCONTINUED] meclizine (ANTIVERT) 25 MG tablet TAKE 1/2 TABLET 3 TIMES A DAY AS NEEDED FOR DIZZINESS  . [DISCONTINUED] meclizine (ANTIVERT) 25 MG tablet TAKE 1/2 TABLET 3 TIMES A DAY AS NEEDED FOR DIZZINESS   Facility-Administered Encounter Medications as of 07/10/2017  Medication  . calcitonin (salmon) (MIACALCIN/FORTICAL) nasal spray 1 spray      Review of Systems  Constitutional: Negative.   HENT: Negative.   Eyes: Negative.   Respiratory: Negative.   Cardiovascular: Negative.   Gastrointestinal: Negative.   Endocrine: Negative.   Genitourinary: Negative.   Musculoskeletal: Negative.   Skin: Negative.   Allergic/Immunologic: Negative.   Neurological: Negative.   Hematological: Negative.   Psychiatric/Behavioral: Negative.        Objective:   Physical Exam  Constitutional: She is oriented to person, place, and time. She appears well-developed and well-nourished.  The patient is pleasant and alert for her age.  HENT:  Head: Normocephalic and atraumatic.  Right Ear: External ear normal.  Left Ear: External ear normal.  Nose: Nose normal.  Mouth/Throat: Oropharynx is clear and moist. No oropharyngeal exudate.  No abnormal findings were noted in the right or left ear canals or external ear areas.  There was no redness.  Eyes: Pupils are equal, round, and reactive to light. Conjunctivae and EOM are normal. Right eye exhibits no discharge. Left eye exhibits no discharge. No scleral icterus.  Neck: Normal range of motion. Neck supple. No thyromegaly present.  No adenopathy or thyromegaly.  Cardiovascular: Normal rate, regular rhythm, normal heart sounds and intact distal pulses. Exam reveals no gallop and no friction rub.  No murmur heard. Heart is regular at 72/min with good pedal pulses.  Pulmonary/Chest: Effort normal and breath sounds normal.  No respiratory distress. She has no wheezes. She has no rales. She exhibits no tenderness.  Lungs are clear anteriorly and posteriorly  Abdominal: Soft. Bowel sounds are normal. She exhibits no mass. There is no tenderness. There is no rebound and no guarding. No hernia.  No abdominal tenderness masses bruits or organ enlargement.  Genitourinary:  Genitourinary Comments: Both breasts were checked today and are clear of abnormalities lumps or masses.  Musculoskeletal: She exhibits no edema or tenderness.  Range of motion is hesitant because of ongoing back pain and patient uses a walker.  She was able to get on the table with assistance.  Lymphadenopathy:    She has no cervical adenopathy.  Neurological: She is alert and oriented to person, place, and time. She has normal reflexes. No cranial nerve deficit.  Skin: Skin is warm and dry.  Psychiatric: She has a normal mood and affect. Her behavior is normal. Judgment and thought content normal.  Nursing note and vitals reviewed.   BP 134/70 (BP Location: Left Arm)   Pulse 73   Temp 98 F (36.7 C) (Oral)   Ht _0  (  1.651 m)   Wt 154 lb (69.9 kg)   BMI 25.63 kg/m        Assessment & Plan:  1. Essential hypertension -Blood pressure is good today and she will continue with current treatment  2. Thrombocytopenia ([place]) -Continue follow-up as needed with hematology  3. Vitamin D deficiency -Continue vitamin D replacement pending results of lab work - VITAMIN D 25 Hydroxy (Vit-D Deficiency, Fractures)  4. Pure hypercholesterolemia -Continue statin drug pending results of lab work - Lipid panel  5. Hypothyroidism, unspecified type -Continue with thyroid replacement pending results of lab work  6. Leukocytosis, unspecified type -Follow-up with hematology as planned  Patient Instructions                       Medicare Annual Wellness Visit  [facility] and the medical providers at [facility] strive to  bring you the best medical care.  In doing so we not only want to address your current medical conditions and concerns but also to detect new conditions early and prevent illness, disease and health-related problems.    Medicare offers a yearly Wellness Visit which allows our clinical staff to assess your need for preventative services including immunizations, lifestyle education, counseling to decrease risk of preventable diseases and screening for fall risk and other medical concerns.    This visit is provided free of charge (no copay) for all Medicare recipients. The clinical pharmacists at [facility] have begun to conduct these Wellness Visits which will also include a thorough review of all your medications.    As you primary medical provider recommend that you make an appointment for your Annual Wellness Visit if you have not done so already this year.  You may set up this appointment before you leave today or you may call back [phone]) and schedule an appointment.  Please make sure when you call that you mention that you are scheduling your Annual Wellness Visit with the clinical pharmacist so that the appointment may be made for the proper length of time.     Continue current medications. Continue good therapeutic lifestyle changes which include good diet and exercise. Fall precautions discussed with patient. If an FOBT was given today- please return it to our front desk. If you are over 76 years old - you may need Prevnar 62 or the adult Pneumonia vaccine.  **Flu shots are available--- please call and schedule a FLU-CLINIC appointment**  After your visit with [place] today you will receive a survey in the mail or online from [employer] regarding your care with [place]. Please take a moment to fill this out. Your feedback is very important to [place] as you can help [place] better understand your patient needs as well as improve your experience and satisfaction. WE CARE ABOUT YOU!!!     Continue to follow-up with hematology as needed Continue to drink plenty of water and fluids and stay well-hydrated Use the walker regularly so as not to fall and have any fractures Stay is active as possible physically and always have assistance when outside the home  Arrie Senate MD

## 2017-07-10 NOTE — Patient Instructions (Addendum)
Medicare Annual Wellness Visit  [facility] and the medical providers at [facility] strive to bring you the best medical care.  In doing so we not only want to address your current medical conditions and concerns but also to detect new conditions early and prevent illness, disease and health-related problems.    Medicare offers a yearly Wellness Visit which allows our clinical staff to assess your need for preventative services including immunizations, lifestyle education, counseling to decrease risk of preventable diseases and screening for fall risk and other medical concerns.    This visit is provided free of charge (no copay) for all Medicare recipients. The clinical pharmacists at [facility] have begun to conduct these Wellness Visits which will also include a thorough review of all your medications.    As you primary medical provider recommend that you make an appointment for your Annual Wellness Visit if you have not done so already this year.  You may set up this appointment before you leave today or you may call back ([phone]) and schedule an appointment.  Please make sure when you call that you mention that you are scheduling your Annual Wellness Visit with the clinical pharmacist so that the appointment may be made for the proper length of time.     Continue current medications. Continue good therapeutic lifestyle changes which include good diet and exercise. Fall precautions discussed with patient. If an FOBT was given today- please return it to our front desk. If you are over 25 years old - you may need Prevnar 34 or the adult Pneumonia vaccine.  **Flu shots are available--- please call and schedule a FLU-CLINIC appointment**  After your visit with [place] today you will receive a survey in the mail or online from [employer] regarding your care with [place]. Please take a moment to fill this out. Your feedback is very  important to [place] as you can help [place] better understand your patient needs as well as improve your experience and satisfaction. WE CARE ABOUT YOU!!!   Continue to follow-up with hematology as needed Continue to drink plenty of water and fluids and stay well-hydrated Use the walker regularly so as not to fall and have any fractures Stay is active as possible physically and always have assistance when outside the home

## 2017-07-11 LAB — LIPID PANEL
CHOL/HDL RATIO: 2.3 ratio (ref 0.0–4.4)
CHOLESTEROL TOTAL: 124 mg/dL (ref 100–199)
HDL: 54 mg/dL (ref >39)
LDL CALC: 49 mg/dL (ref 0–99)
Triglycerides: 105 mg/dL (ref 0–149)
VLDL CHOLESTEROL CAL: 21 mg/dL (ref 5–40)

## 2017-07-11 LAB — VITAMIN D 25 HYDROXY (VIT D DEFICIENCY, FRACTURES): VIT D 25 HYDROXY: 44.8 ng/mL (ref 30.0–100.0)

## 2017-07-17 DIAGNOSIS — F4323 Adjustment disorder with mixed anxiety and depressed mood: Secondary | ICD-10-CM | POA: Diagnosis not present

## 2017-07-23 ENCOUNTER — Ambulatory Visit (HOSPITAL_COMMUNITY): Payer: Medicare Other | Admitting: Internal Medicine

## 2017-07-23 ENCOUNTER — Other Ambulatory Visit (HOSPITAL_COMMUNITY): Payer: Medicare Other

## 2017-07-26 ENCOUNTER — Other Ambulatory Visit: Payer: Self-pay | Admitting: Family Medicine

## 2017-08-17 ENCOUNTER — Other Ambulatory Visit: Payer: Self-pay

## 2017-08-17 ENCOUNTER — Encounter (HOSPITAL_COMMUNITY): Payer: Self-pay | Admitting: Emergency Medicine

## 2017-08-17 ENCOUNTER — Emergency Department (HOSPITAL_COMMUNITY): Payer: Medicare Other

## 2017-08-17 ENCOUNTER — Emergency Department (HOSPITAL_COMMUNITY)
Admission: EM | Admit: 2017-08-17 | Discharge: 2017-08-17 | Disposition: A | Discharge status: Home / Self Care | Payer: Medicare Other | Attending: Emergency Medicine | Admitting: Emergency Medicine

## 2017-08-17 DIAGNOSIS — N3000 Acute cystitis without hematuria: Secondary | ICD-10-CM | POA: Diagnosis not present

## 2017-08-17 DIAGNOSIS — R4182 Altered mental status, unspecified: Secondary | ICD-10-CM | POA: Diagnosis not present

## 2017-08-17 DIAGNOSIS — S299XXA Unspecified injury of thorax, initial encounter: Secondary | ICD-10-CM | POA: Diagnosis not present

## 2017-08-17 DIAGNOSIS — Z7722 Contact with and (suspected) exposure to environmental tobacco smoke (acute) (chronic): Secondary | ICD-10-CM | POA: Insufficient documentation

## 2017-08-17 DIAGNOSIS — Z96651 Presence of right artificial knee joint: Secondary | ICD-10-CM | POA: Diagnosis not present

## 2017-08-17 DIAGNOSIS — E039 Hypothyroidism, unspecified: Secondary | ICD-10-CM | POA: Diagnosis not present

## 2017-08-17 DIAGNOSIS — Z9181 History of falling: Secondary | ICD-10-CM | POA: Diagnosis not present

## 2017-08-17 DIAGNOSIS — Z79899 Other long term (current) drug therapy: Secondary | ICD-10-CM | POA: Diagnosis not present

## 2017-08-17 DIAGNOSIS — R350 Frequency of micturition: Secondary | ICD-10-CM | POA: Diagnosis not present

## 2017-08-17 DIAGNOSIS — I10 Essential (primary) hypertension: Secondary | ICD-10-CM | POA: Diagnosis not present

## 2017-08-17 DIAGNOSIS — R410 Disorientation, unspecified: Secondary | ICD-10-CM | POA: Diagnosis not present

## 2017-08-17 DIAGNOSIS — F99 Mental disorder, not otherwise specified: Secondary | ICD-10-CM | POA: Diagnosis not present

## 2017-08-17 LAB — COMPREHENSIVE METABOLIC PANEL
ALK PHOS: 104 U/L (ref 38–126)
ALT: 13 U/L — ABNORMAL LOW (ref 14–54)
ANION GAP: 8 (ref 5–15)
AST: 23 U/L (ref 15–41)
Albumin: 3 g/dL — ABNORMAL LOW (ref 3.5–5.0)
BILIRUBIN TOTAL: 0.7 mg/dL (ref 0.3–1.2)
BUN: 26 mg/dL — ABNORMAL HIGH (ref 6–20)
CALCIUM: 10.1 mg/dL (ref 8.9–10.3)
CO2: 28 mmol/L (ref 22–32)
CREATININE: 1.35 mg/dL — AB (ref 0.44–1.00)
Chloride: 102 mmol/L (ref 101–111)
GFR calc non Af Amer: 33 mL/min — ABNORMAL LOW (ref >60)
GFR, EST AFRICAN AMERICAN: 39 mL/min — AB (ref >60)
Glucose, Bld: 101 mg/dL — ABNORMAL HIGH (ref 65–99)
Potassium: 4.1 mmol/L (ref 3.5–5.1)
Sodium: 138 mmol/L (ref 135–145)
TOTAL PROTEIN: 7.5 g/dL (ref 6.5–8.1)

## 2017-08-17 LAB — CBC WITH DIFFERENTIAL/PLATELET
BASOS PCT: 0 %
BLASTS: 0 %
Band Neutrophils: 0 %
Basophils Absolute: 0 10*3/uL (ref 0.0–0.1)
Eosinophils Absolute: 0 10*3/uL (ref 0.0–0.7)
Eosinophils Relative: 0 %
HEMATOCRIT: 35.8 % — AB (ref 36.0–46.0)
HEMOGLOBIN: 10.9 g/dL — AB (ref 12.0–15.0)
Lymphocytes Relative: 14 %
Lymphs Abs: 3.7 10*3/uL (ref 0.7–4.0)
MCH: 28.9 pg (ref 26.0–34.0)
MCHC: 30.4 g/dL (ref 30.0–36.0)
MCV: 95 fL (ref 78.0–100.0)
Metamyelocytes Relative: 0 %
Monocytes Absolute: 0.3 10*3/uL (ref 0.1–1.0)
Monocytes Relative: 1 %
Myelocytes: 0 %
NEUTROS PCT: 85 %
Neutro Abs: 22.3 10*3/uL — ABNORMAL HIGH (ref 1.7–7.7)
OTHER: 0 %
PROMYELOCYTES RELATIVE: 0 %
Platelets: 191 10*3/uL (ref 150–400)
RBC: 3.77 MIL/uL — AB (ref 3.87–5.11)
RDW: 13.2 % (ref 11.5–15.5)
WBC: 26.3 10*3/uL — ABNORMAL HIGH (ref 4.0–10.5)
nRBC: 0 /100 WBC

## 2017-08-17 LAB — URINALYSIS, ROUTINE W REFLEX MICROSCOPIC
BILIRUBIN URINE: NEGATIVE
Bacteria, UA: NONE SEEN
Glucose, UA: NEGATIVE mg/dL
Ketones, ur: NEGATIVE mg/dL
NITRITE: NEGATIVE
PH: 5 (ref 5.0–8.0)
Protein, ur: NEGATIVE mg/dL
SPECIFIC GRAVITY, URINE: 1.006 (ref 1.005–1.030)

## 2017-08-17 LAB — LACTIC ACID, PLASMA: Lactic Acid, Venous: 0.7 mmol/L (ref 0.5–1.9)

## 2017-08-17 MED ORDER — CEPHALEXIN 500 MG PO CAPS: 500.0000 mg | ORAL_CAPSULE | Freq: Four times a day (QID) | ORAL | 0 refills | Status: DC

## 2017-08-17 MED ORDER — SODIUM CHLORIDE 0.9 % IV BOLUS
500.0000 mL | Freq: Once | INTRAVENOUS | Status: AC
  Administered 2017-08-17: 500 mL via INTRAVENOUS

## 2017-08-17 MED ORDER — SODIUM CHLORIDE 0.9 % IV SOLN
1.0000 g | Freq: Once | INTRAVENOUS | Status: AC
  Administered 2017-08-17: 1 g via INTRAVENOUS
  Filled 2017-08-17: qty 10

## 2017-08-17 NOTE — ED Triage Notes (Signed)
Pt was sent from [facility] urgent care for continued work up of confusion for past three days. Pt had cath urine checked at urgent care which was negative. Pt also had slip and fall this am on wet surface hitting head per granddaughter.

## 2017-08-17 NOTE — Discharge Instructions (Addendum)
Follow-up with your doctor this week for recheck. 

## 2017-08-17 NOTE — ED Provider Notes (Signed)
[facility] EMERGENCY DEPARTMENT Provider Note   CSN: [id number] Arrival date & time: 08/17/17  1717     History   Chief Complaint Chief Complaint  Patient presents with  . Altered Mental Status    HPI [name] is a 84 y.o. female.  Patient was brought to the emergency department because she was more confused than normal.  This is happened before when she has a urinary tract infection  The history is provided by a relative. No language interpreter was used.  Altered Mental Status   This is a recurrent problem. The current episode started 2 days ago. The problem has not changed since onset.Associated symptoms include confusion. Pertinent negatives include no seizures and no hallucinations. Risk factors: 85 years old. Her past medical history does not include seizures.    Past Medical History:  Diagnosis Date  . Adhesion of abdominal wall    "continues to have abdominal pain pain intermittent right abdominal side. "scar tissue"  . Arthritis   . Breast cyst   . Bronchitis, asthmatic    'bronchitis that will go into an asthma like attack but haven't had it in years'  . Colon polyps   . Elevated blood sugar   . Esophageal stricture    "occ. problems with swallowing water"  . Full dentures   . GI bleed   . Goiter   . History of blood transfusion   . History of hiatal hernia    repaired over 20 years ago  . ITP (idiopathic thrombocytopenic purpura)    not a problem now.  . Other and unspecified hyperlipidemia   . Peptic ulcer disease   . Rhinitis   . Unspecified essential hypertension   . Unspecified hypothyroidism     Patient Active Problem List   Diagnosis Date Noted  . Confusion and disorientation 02/01/2017  . UTI (urinary tract infection) 02/01/2017  . AKI (acute kidney injury) ([place]) 02/01/2017  . Lumbar radiculopathy 03/14/2016  . HNP (herniated nucleus pulposus), cervical 05/16/2015    Class: Chronic  . Herniated nucleus pulposus, lumbar 05/16/2015   . Abnormal EKG 04/21/2015  . Thrombocytopenia ([place]) 08/18/2014  . Insomnia 11/24/2013  . Impingement syndrome of left shoulder 09/18/2013  . S/P arthroscopy of shoulder 09/18/2013  . Low back pain 03/17/2013  . Neuropathy 03/17/2013  . Metabolic syndrome 46/27/0350  . Overweight (BMI 25.0-29.9) 03/17/2013  . Vitamin D deficiency 12/01/2012  . Chronic insomnia 12/01/2012  . Fibrocystic breast changes 05/21/2011  . Essential hypertension   . Hyperlipidemia   . Peptic ulcer disease   . ITP (idiopathic thrombocytopenic purpura)   . Rhinitis   . Elevated blood sugar   . Hypothyroidism   . Goiter   . Colon polyps   . Adhesion of abdominal wall   . Esophageal stricture     Past Surgical History:  Procedure Laterality Date  . ABDOMINAL HYSTERECTOMY    . APPENDECTOMY    . BACK SURGERY     x1 Lumbar, x2 cervical anterior and posterior .  Marland Kitchen BREAST SURGERY     x3 biopsy  . cataract surgery Bilateral   . CHOLECYSTECTOMY    . EYE SURGERY Bilateral 2007   cataracts   . HEMORRHOID SURGERY    . HERNIA REPAIR    . IR RADIOLOGIST EVAL & MGMT  12/24/2016  . IR SACROPLASTY BILATERAL  12/28/2016  . JOINT REPLACEMENT    . LUMBAR LAMINECTOMY N/A 05/16/2015   Procedure: MICRODISCECTOMY Lumbar 3-Lumbar 4;  Surgeon: Daleen Bo  Louanne Skye, MD;  Location: [place];  Service: Orthopedics;  Laterality: N/A;  . 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 THYROID SURGERY     Total "goiter removal"  . TOTAL KNEE ARTHROPLASTY Right 2010     OB History   None      Home Medications    Prior to Admission medications   Medication Sig Start Date End Date Taking? Authorizing Provider  atorvastatin (LIPITOR) 80 MG tablet Take 1 tablet (80 mg total) by mouth daily. 06/26/17  Yes Chipper Herb, MD  calcium citrate-vitamin D (CITRACAL+D) 315-200 MG-UNIT per tablet Take 1 tablet by mouth daily.   Yes [provider]  Cholecalciferol (VITAMIN D3) 2000 UNITS TABS Take 1 tablet by mouth  daily.     Yes [provider]  DULoxetine (CYMBALTA) 60 MG capsule Take 60 mg by mouth every morning.   Yes [provider]  fluticasone (FLONASE) 50 MCG/ACT nasal spray Place 2 sprays into both nostrils daily. 06/24/17  Yes Chipper Herb, MD  gabapentin (NEURONTIN) 400 MG capsule Take 1 capsule by mouth daily. 04/05/17  Yes [provider]  levothyroxine (SYNTHROID, LEVOTHROID) 125 MCG tablet Take 1 tablet (125 mcg total) by mouth daily before breakfast. 02/04/17  Yes Isaac Bliss, Rayford Halsted, MD  meclizine (ANTIVERT) 25 MG tablet TAKE 1/2 TABLET 3 TIMES A DAY AS NEEDED FOR DIZZINESS 01/04/17  Yes Chipper Herb, MD  Multiple Vitamin (MULTIVITAMIN WITH MINERALS) TABS tablet Take 1 tablet by mouth daily.   Yes [provider]  polyvinyl alcohol (LIQUIFILM TEARS) 1.4 % ophthalmic solution Place 1 drop into both eyes as needed for dry eyes.   Yes [provider]  potassium chloride (K-DUR) 10 MEQ tablet Take 1 tablet (10 mEq total) by mouth daily. 07/26/17  Yes Chipper Herb, MD  QUEtiapine (SEROQUEL) 50 MG tablet Take 50 mg by mouth at bedtime.  05/06/17  Yes [provider]  valsartan-hydrochlorothiazide (DIOVAN-HCT) 320-25 MG tablet TAKE 1 TABLET DAILY 06/21/17  Yes Chipper Herb, MD  cephALEXin (KEFLEX) 500 MG capsule Take 1 capsule (500 mg total) by mouth 4 (four) times daily. 08/17/17   Milton Ferguson, MD  nitrofurantoin, macrocrystal-monohydrate, (MACROBID) 100 MG capsule Take 100 mg by mouth 2 (two) times daily. 3 day course-Has not yet started 08/17/17 08/20/17  [provider]    Family History Family History  Problem Relation Age of Onset  . Lung cancer Father   . Heart disease Mother        Unkown.  Died age 65  . Emphysema Sister     Social History Social History   Tobacco Use  . Smoking status: Passive Smoke Exposure - Never Smoker  . Smokeless tobacco: Never Used  Substance Use Topics  . Alcohol use: No  .  Drug use: No     Allergies   Prednisone   Review of Systems Review of Systems  Constitutional: Negative for appetite change and fatigue.  HENT: Negative for congestion, ear discharge and sinus pressure.   Eyes: Negative for discharge.  Respiratory: Negative for cough.   Cardiovascular: Negative for chest pain.  Gastrointestinal: Negative for abdominal pain and diarrhea.  Genitourinary: Negative for frequency and hematuria.  Musculoskeletal: Negative for back pain.  Skin: Negative for rash.  Neurological: Negative for seizures and headaches.  Psychiatric/Behavioral: Positive for confusion. Negative for hallucinations.     Physical Exam Updated Vital Signs BP (!) 135/55   Pulse 73  Temp 99.2 F (37.3 C) (Oral)   Resp (!) 25   Wt 65.3 kg (144 lb)   SpO2 100%   BMI 23.96 kg/m   Physical Exam  Constitutional: She appears well-developed.  HENT:  Head: Normocephalic.  Eyes: Conjunctivae and EOM are normal. No scleral icterus.  Neck: Neck supple. No thyromegaly present.  Cardiovascular: Normal rate and regular rhythm. Exam reveals no gallop and no friction rub.  No murmur heard. Pulmonary/Chest: No stridor. She has no wheezes. She has no rales. She exhibits no tenderness.  Abdominal: She exhibits no distension. There is no tenderness. There is no rebound.  Musculoskeletal: Normal range of motion. She exhibits no edema.  Lymphadenopathy:    She has no cervical adenopathy.  Neurological: She is alert. She exhibits normal muscle tone. Coordination normal.  Patient oriented to person and place  Skin: No rash noted. No erythema.  Psychiatric: She has a normal mood and affect. Her behavior is normal.     ED Treatments / Results  Labs (all labs ordered are listed, but only abnormal results are displayed) Labs Reviewed  CBC WITH DIFFERENTIAL/PLATELET - Abnormal; Notable for the following components:      Result Value   WBC 26.3 (*)    RBC 3.77 (*)    Hemoglobin 10.9  (*)    HCT 35.8 (*)    Neutro Abs 22.3 (*)    All other components within normal limits  COMPREHENSIVE METABOLIC PANEL - Abnormal; Notable for the following components:   Glucose, Bld 101 (*)    BUN 26 (*)    Creatinine, Ser 1.35 (*)    Albumin 3.0 (*)    ALT 13 (*)    GFR calc non Af Amer 33 (*)    GFR calc Af Amer 39 (*)    All other components within normal limits  URINALYSIS, ROUTINE W REFLEX MICROSCOPIC - Abnormal; Notable for the following components:   APPearance HAZY (*)    Hgb urine dipstick SMALL (*)    Leukocytes, UA SMALL (*)    All other components within normal limits  URINE CULTURE  LACTIC ACID, PLASMA    EKG EKG Interpretation  Date/Time:  Saturday August 17 2017 17:44:21 EDT Ventricular Rate:  78 PR Interval:    QRS Duration: 143 QT Interval:  386 QTC Calculation: 440 R Axis:   -99 Text Interpretation:  Sinus rhythm Short PR interval RBBB and LAFB Inferior infarct, acute Baseline wander in lead(s) III Confirmed by Milton Ferguson [phone]) on 08/17/2017 8:07:03 PM   Radiology Dg Chest 2 View  Result Date: 08/17/2017 CLINICAL DATA:  Confusion, slipped and fell this morning striking head EXAM: CHEST - 2 VIEW COMPARISON:  02/01/2017 FINDINGS: Normal heart size, mediastinal contours, and pulmonary vascularity. Atherosclerotic calcification aorta. Lungs clear. No pulmonary infiltrate, pleural effusion or pneumothorax. BILATERAL surgical clips at the inferior cervical region by history prior thyroidectomy. Bones demineralized. IMPRESSION: No acute abnormalities. Electronically Signed   By: Lavonia Dana M.D.   On: 08/17/2017 18:52   Ct Head Wo Contrast  Result Date: 08/17/2017 CLINICAL DATA:  Confusion for 3 days, slipped and fell this morning on wet surface striking head per granddaughter EXAM: CT HEAD WITHOUT CONTRAST TECHNIQUE: Contiguous axial images were obtained from the base of the skull through the vertex without intravenous contrast. Sagittal and coronal MPR images  reconstructed from axial data set. COMPARISON:  02/01/2017 FINDINGS: Brain: Generalized atrophy. Normal ventricular morphology. No midline shift or mass effect. Small vessel chronic ischemic changes of  deep cerebral white matter. No intracranial hemorrhage, mass lesion, evidence of acute infarction, or extra-axial fluid collection. Vascular: Atherosclerotic calcification of internal carotid and vertebral arteries at skull base. No hyperdense vessels. Skull: Intact Sinuses/Orbits: Clear Other: N/A IMPRESSION: Atrophy with small vessel chronic ischemic changes of deep cerebral white matter. No acute intracranial abnormalities. Electronically Signed   By: Lavonia Dana M.D.   On: 08/17/2017 18:47    Procedures Procedures (including critical care time)  Medications Ordered in ED Medications  sodium chloride 0.9 % bolus 500 mL (0 mLs Intravenous Stopped 08/17/17 1930)  cefTRIAXone (ROCEPHIN) 1 g in sodium chloride 0.9 % 100 mL IVPB (1 g Intravenous New Bag/Given 08/17/17 2039)     Initial Impression / Assessment and Plan / ED Course  I have reviewed the triage vital signs and the nursing notes.  Pertinent labs & imaging results that were available during my care of the patient were reviewed by me and considered in my medical decision making (see chart for details). Labs show patient's white blood count is elevated but this is chronic for her and has been evaluated before.  CT scan of head negative.  Urinalysis shows 20-50 white cells.  Suspect urinary tract infection.  Patient is given a gram of Rocephin and put on Keflex and will follow-up with her PCP      Final Clinical Impressions(s) / ED Diagnoses   Final diagnoses:  Acute cystitis without hematuria    ED Discharge Orders        Ordered    cephALEXin (KEFLEX) 500 MG capsule  4 times daily     08/17/17 2211       Milton Ferguson, MD 08/17/17 2218

## 2017-08-17 NOTE — ED Notes (Signed)
Dr Earnest Conroy informed of WBC

## 2017-08-19 ENCOUNTER — Other Ambulatory Visit: Payer: Self-pay | Admitting: *Deleted

## 2017-08-19 DIAGNOSIS — Z87440 Personal history of urinary (tract) infections: Secondary | ICD-10-CM

## 2017-08-21 LAB — URINE CULTURE: Culture: 40000 — AB

## 2017-08-22 ENCOUNTER — Telehealth: Payer: Self-pay | Admitting: Emergency Medicine

## 2017-08-22 NOTE — Telephone Encounter (Signed)
Post ED Visit - Positive Culture Follow-up  Culture report reviewed by antimicrobial stewardship pharmacist:  []  Elenor Quinones, Pharm.D. []  Heide Guile, Pharm.D., BCPS AQ-ID []  Parks Neptune, Pharm.D., BCPS []  Alycia Rossetti, Pharm.D., BCPS []  [place], Pharm.D., BCPS, AAHIVP []  Legrand Como, Pharm.D., BCPS, AAHIVP []  Salome Arnt, PharmD, BCPS []  Wynell Balloon, PharmD []  Vincenza Hews, PharmD, BCPS [facility] PharmD  Positive urine culture Treated with nitrofurantoin and cephalexin, organism sensitive to the same and no further patient follow-up is required at this time.  Hazle Nordmann 08/22/2017, 4:26 PM

## 2017-08-28 ENCOUNTER — Other Ambulatory Visit: Payer: Medicare Other

## 2017-08-28 DIAGNOSIS — R300 Dysuria: Secondary | ICD-10-CM | POA: Diagnosis not present

## 2017-08-28 DIAGNOSIS — Z87440 Personal history of urinary (tract) infections: Secondary | ICD-10-CM

## 2017-08-28 LAB — MICROSCOPIC EXAMINATION: BACTERIA UA: NONE SEEN

## 2017-08-28 LAB — URINALYSIS, COMPLETE
Bilirubin, UA: NEGATIVE
GLUCOSE, UA: NEGATIVE
KETONES UA: NEGATIVE
LEUKOCYTES UA: NEGATIVE
NITRITE UA: NEGATIVE
Protein, UA: NEGATIVE
RBC, UA: NEGATIVE
SPEC GRAV UA: 1.015 (ref 1.005–1.030)
Urobilinogen, Ur: 0.2 mg/dL (ref 0.2–1.0)
pH, UA: 5.5 (ref 5.0–7.5)

## 2017-09-01 LAB — URINE CULTURE

## 2017-09-02 ENCOUNTER — Other Ambulatory Visit: Payer: Self-pay | Admitting: Family Medicine

## 2017-09-09 ENCOUNTER — Telehealth: Payer: Self-pay | Admitting: Family Medicine

## 2017-09-09 ENCOUNTER — Other Ambulatory Visit: Payer: Self-pay

## 2017-09-09 MED ORDER — AMOXICILLIN-POT CLAVULANATE 875-125 MG PO TABS: 1.0000 | ORAL_TABLET | Freq: Two times a day (BID) | ORAL | 0 refills | Status: DC

## 2017-09-09 MED ORDER — AMOXICILLIN-POT CLAVULANATE 875-125 MG PO TABS: 1.0000 | ORAL_TABLET | Freq: Every day | ORAL | 0 refills | Status: DC

## 2017-09-09 NOTE — Telephone Encounter (Signed)
Rx sent to [place] per lab note. Patients son notified

## 2017-09-16 ENCOUNTER — Other Ambulatory Visit: Payer: Self-pay

## 2017-09-16 ENCOUNTER — Emergency Department (HOSPITAL_COMMUNITY): Payer: Medicare Other

## 2017-09-16 ENCOUNTER — Inpatient Hospital Stay (HOSPITAL_COMMUNITY)
Admission: EM | Admit: 2017-09-16 | Discharge: 2017-09-23 | DRG: 871 | Disposition: A | Discharge status: Hospice | Payer: Medicare Other | Attending: Internal Medicine | Admitting: Internal Medicine

## 2017-09-16 ENCOUNTER — Encounter (HOSPITAL_COMMUNITY): Payer: Self-pay

## 2017-09-16 DIAGNOSIS — R109 Unspecified abdominal pain: Secondary | ICD-10-CM

## 2017-09-16 DIAGNOSIS — N179 Acute kidney failure, unspecified: Secondary | ICD-10-CM | POA: Diagnosis present

## 2017-09-16 DIAGNOSIS — N390 Urinary tract infection, site not specified: Secondary | ICD-10-CM

## 2017-09-16 DIAGNOSIS — G629 Polyneuropathy, unspecified: Secondary | ICD-10-CM | POA: Diagnosis present

## 2017-09-16 DIAGNOSIS — A419 Sepsis, unspecified organism: Principal | ICD-10-CM | POA: Diagnosis present

## 2017-09-16 DIAGNOSIS — Z90710 Acquired absence of both cervix and uterus: Secondary | ICD-10-CM

## 2017-09-16 DIAGNOSIS — R4020 Unspecified coma: Secondary | ICD-10-CM | POA: Diagnosis not present

## 2017-09-16 DIAGNOSIS — K279 Peptic ulcer, site unspecified, unspecified as acute or chronic, without hemorrhage or perforation: Secondary | ICD-10-CM

## 2017-09-16 DIAGNOSIS — E861 Hypovolemia: Secondary | ICD-10-CM | POA: Diagnosis present

## 2017-09-16 DIAGNOSIS — R05 Cough: Secondary | ICD-10-CM | POA: Diagnosis not present

## 2017-09-16 DIAGNOSIS — Z791 Long term (current) use of non-steroidal anti-inflammatories (NSAID): Secondary | ICD-10-CM

## 2017-09-16 DIAGNOSIS — F0390 Unspecified dementia without behavioral disturbance: Secondary | ICD-10-CM | POA: Diagnosis present

## 2017-09-16 DIAGNOSIS — R410 Disorientation, unspecified: Secondary | ICD-10-CM

## 2017-09-16 DIAGNOSIS — D62 Acute posthemorrhagic anemia: Secondary | ICD-10-CM | POA: Diagnosis present

## 2017-09-16 DIAGNOSIS — G92 Toxic encephalopathy: Secondary | ICD-10-CM | POA: Diagnosis present

## 2017-09-16 DIAGNOSIS — W19XXXA Unspecified fall, initial encounter: Secondary | ICD-10-CM

## 2017-09-16 DIAGNOSIS — I959 Hypotension, unspecified: Secondary | ICD-10-CM

## 2017-09-16 DIAGNOSIS — R4182 Altered mental status, unspecified: Secondary | ICD-10-CM | POA: Diagnosis present

## 2017-09-16 DIAGNOSIS — Z7951 Long term (current) use of inhaled steroids: Secondary | ICD-10-CM

## 2017-09-16 DIAGNOSIS — Z96651 Presence of right artificial knee joint: Secondary | ICD-10-CM | POA: Diagnosis present

## 2017-09-16 DIAGNOSIS — Z79899 Other long term (current) drug therapy: Secondary | ICD-10-CM

## 2017-09-16 DIAGNOSIS — Z79890 Hormone replacement therapy (postmenopausal): Secondary | ICD-10-CM

## 2017-09-16 DIAGNOSIS — Z9081 Acquired absence of spleen: Secondary | ICD-10-CM

## 2017-09-16 DIAGNOSIS — E039 Hypothyroidism, unspecified: Secondary | ICD-10-CM | POA: Diagnosis present

## 2017-09-16 DIAGNOSIS — R531 Weakness: Secondary | ICD-10-CM | POA: Diagnosis not present

## 2017-09-16 DIAGNOSIS — K921 Melena: Secondary | ICD-10-CM

## 2017-09-16 DIAGNOSIS — Z515 Encounter for palliative care: Secondary | ICD-10-CM | POA: Diagnosis present

## 2017-09-16 DIAGNOSIS — M25552 Pain in left hip: Secondary | ICD-10-CM | POA: Diagnosis not present

## 2017-09-16 DIAGNOSIS — Z66 Do not resuscitate: Secondary | ICD-10-CM | POA: Diagnosis present

## 2017-09-16 DIAGNOSIS — E785 Hyperlipidemia, unspecified: Secondary | ICD-10-CM | POA: Diagnosis present

## 2017-09-16 DIAGNOSIS — I10 Essential (primary) hypertension: Secondary | ICD-10-CM | POA: Diagnosis present

## 2017-09-16 DIAGNOSIS — S0990XA Unspecified injury of head, initial encounter: Secondary | ICD-10-CM | POA: Diagnosis not present

## 2017-09-16 DIAGNOSIS — S79912A Unspecified injury of left hip, initial encounter: Secondary | ICD-10-CM | POA: Diagnosis not present

## 2017-09-16 DIAGNOSIS — E860 Dehydration: Secondary | ICD-10-CM | POA: Diagnosis present

## 2017-09-16 LAB — I-STAT CG4 LACTIC ACID, ED: Lactic Acid, Venous: 1.76 mmol/L (ref 0.5–1.9)

## 2017-09-16 LAB — CBG MONITORING, ED: Glucose-Capillary: 132 mg/dL — ABNORMAL HIGH (ref 70–99)

## 2017-09-16 NOTE — ED Triage Notes (Signed)
Pt currently taking antibiotics for recent UTI. Reported from family Pt had recent fall on July 4th, 2019 and currently c/o pain on left side. Pt arrived from home via [facility] called out for weakness and altered mental status. [facility] performed stroke screen enroute and report result is negative with minor discrepancies of Pts orientation to time (Pt states it is 1980).

## 2017-09-17 ENCOUNTER — Encounter (HOSPITAL_COMMUNITY): Payer: Self-pay

## 2017-09-17 ENCOUNTER — Other Ambulatory Visit: Payer: Self-pay

## 2017-09-17 ENCOUNTER — Other Ambulatory Visit: Payer: Self-pay | Admitting: Family Medicine

## 2017-09-17 DIAGNOSIS — E039 Hypothyroidism, unspecified: Secondary | ICD-10-CM | POA: Diagnosis not present

## 2017-09-17 DIAGNOSIS — M6281 Muscle weakness (generalized): Secondary | ICD-10-CM | POA: Diagnosis not present

## 2017-09-17 DIAGNOSIS — S79912A Unspecified injury of left hip, initial encounter: Secondary | ICD-10-CM | POA: Diagnosis not present

## 2017-09-17 DIAGNOSIS — Z515 Encounter for palliative care: Secondary | ICD-10-CM | POA: Diagnosis not present

## 2017-09-17 DIAGNOSIS — K5900 Constipation, unspecified: Secondary | ICD-10-CM | POA: Diagnosis not present

## 2017-09-17 DIAGNOSIS — S0990XA Unspecified injury of head, initial encounter: Secondary | ICD-10-CM | POA: Diagnosis not present

## 2017-09-17 DIAGNOSIS — R4020 Unspecified coma: Secondary | ICD-10-CM | POA: Diagnosis not present

## 2017-09-17 DIAGNOSIS — G9349 Other encephalopathy: Secondary | ICD-10-CM | POA: Diagnosis not present

## 2017-09-17 DIAGNOSIS — N200 Calculus of kidney: Secondary | ICD-10-CM | POA: Diagnosis not present

## 2017-09-17 DIAGNOSIS — E861 Hypovolemia: Secondary | ICD-10-CM | POA: Diagnosis present

## 2017-09-17 DIAGNOSIS — D62 Acute posthemorrhagic anemia: Secondary | ICD-10-CM | POA: Diagnosis not present

## 2017-09-17 DIAGNOSIS — R410 Disorientation, unspecified: Secondary | ICD-10-CM | POA: Diagnosis not present

## 2017-09-17 DIAGNOSIS — M25552 Pain in left hip: Secondary | ICD-10-CM | POA: Diagnosis not present

## 2017-09-17 DIAGNOSIS — N390 Urinary tract infection, site not specified: Secondary | ICD-10-CM | POA: Diagnosis not present

## 2017-09-17 DIAGNOSIS — Z7401 Bed confinement status: Secondary | ICD-10-CM | POA: Diagnosis not present

## 2017-09-17 DIAGNOSIS — G92 Toxic encephalopathy: Secondary | ICD-10-CM | POA: Diagnosis not present

## 2017-09-17 DIAGNOSIS — Z9081 Acquired absence of spleen: Secondary | ICD-10-CM | POA: Diagnosis not present

## 2017-09-17 DIAGNOSIS — Z79890 Hormone replacement therapy (postmenopausal): Secondary | ICD-10-CM | POA: Diagnosis not present

## 2017-09-17 DIAGNOSIS — E43 Unspecified severe protein-calorie malnutrition: Secondary | ICD-10-CM | POA: Diagnosis not present

## 2017-09-17 DIAGNOSIS — R279 Unspecified lack of coordination: Secondary | ICD-10-CM | POA: Diagnosis not present

## 2017-09-17 DIAGNOSIS — A419 Sepsis, unspecified organism: Secondary | ICD-10-CM | POA: Diagnosis present

## 2017-09-17 DIAGNOSIS — Z791 Long term (current) use of non-steroidal anti-inflammatories (NSAID): Secondary | ICD-10-CM | POA: Diagnosis not present

## 2017-09-17 DIAGNOSIS — Z7951 Long term (current) use of inhaled steroids: Secondary | ICD-10-CM | POA: Diagnosis not present

## 2017-09-17 DIAGNOSIS — Z90710 Acquired absence of both cervix and uterus: Secondary | ICD-10-CM | POA: Diagnosis not present

## 2017-09-17 DIAGNOSIS — R52 Pain, unspecified: Secondary | ICD-10-CM | POA: Diagnosis not present

## 2017-09-17 DIAGNOSIS — N179 Acute kidney failure, unspecified: Secondary | ICD-10-CM | POA: Diagnosis present

## 2017-09-17 DIAGNOSIS — Z79899 Other long term (current) drug therapy: Secondary | ICD-10-CM | POA: Diagnosis not present

## 2017-09-17 DIAGNOSIS — Z66 Do not resuscitate: Secondary | ICD-10-CM | POA: Diagnosis present

## 2017-09-17 DIAGNOSIS — R601 Generalized edema: Secondary | ICD-10-CM | POA: Diagnosis not present

## 2017-09-17 DIAGNOSIS — R05 Cough: Secondary | ICD-10-CM | POA: Diagnosis not present

## 2017-09-17 DIAGNOSIS — F419 Anxiety disorder, unspecified: Secondary | ICD-10-CM | POA: Diagnosis not present

## 2017-09-17 DIAGNOSIS — E860 Dehydration: Secondary | ICD-10-CM | POA: Diagnosis present

## 2017-09-17 DIAGNOSIS — K254 Chronic or unspecified gastric ulcer with hemorrhage: Secondary | ICD-10-CM | POA: Diagnosis not present

## 2017-09-17 DIAGNOSIS — F0390 Unspecified dementia without behavioral disturbance: Secondary | ICD-10-CM | POA: Diagnosis present

## 2017-09-17 DIAGNOSIS — Z96651 Presence of right artificial knee joint: Secondary | ICD-10-CM | POA: Diagnosis present

## 2017-09-17 DIAGNOSIS — I10 Essential (primary) hypertension: Secondary | ICD-10-CM | POA: Diagnosis not present

## 2017-09-17 DIAGNOSIS — R4182 Altered mental status, unspecified: Secondary | ICD-10-CM | POA: Diagnosis not present

## 2017-09-17 DIAGNOSIS — K921 Melena: Secondary | ICD-10-CM | POA: Diagnosis not present

## 2017-09-17 DIAGNOSIS — E785 Hyperlipidemia, unspecified: Secondary | ICD-10-CM | POA: Diagnosis not present

## 2017-09-17 DIAGNOSIS — K922 Gastrointestinal hemorrhage, unspecified: Secondary | ICD-10-CM | POA: Diagnosis not present

## 2017-09-17 DIAGNOSIS — I959 Hypotension, unspecified: Secondary | ICD-10-CM | POA: Diagnosis not present

## 2017-09-17 DIAGNOSIS — G629 Polyneuropathy, unspecified: Secondary | ICD-10-CM | POA: Diagnosis present

## 2017-09-17 LAB — CBC
HCT: 29.5 % — ABNORMAL LOW (ref 36.0–46.0)
HEMOGLOBIN: 9 g/dL — AB (ref 12.0–15.0)
MCH: 28.8 pg (ref 26.0–34.0)
MCHC: 30.5 g/dL (ref 30.0–36.0)
MCV: 94.6 fL (ref 78.0–100.0)
Platelets: 185 10*3/uL (ref 150–400)
RBC: 3.12 MIL/uL — ABNORMAL LOW (ref 3.87–5.11)
RDW: 12.9 % (ref 11.5–15.5)
WBC: 25 10*3/uL — ABNORMAL HIGH (ref 4.0–10.5)

## 2017-09-17 LAB — URINALYSIS, ROUTINE W REFLEX MICROSCOPIC
Bilirubin Urine: NEGATIVE
GLUCOSE, UA: NEGATIVE mg/dL
Hgb urine dipstick: NEGATIVE
Ketones, ur: NEGATIVE mg/dL
Nitrite: NEGATIVE
PH: 7 (ref 5.0–8.0)
Protein, ur: NEGATIVE mg/dL
SPECIFIC GRAVITY, URINE: 1.013 (ref 1.005–1.030)

## 2017-09-17 LAB — I-STAT CG4 LACTIC ACID, ED: Lactic Acid, Venous: 0.66 mmol/L (ref 0.5–1.9)

## 2017-09-17 LAB — COMPREHENSIVE METABOLIC PANEL
ALBUMIN: 2.4 g/dL — AB (ref 3.5–5.0)
ALK PHOS: 92 U/L (ref 38–126)
ALT: 13 U/L (ref 0–44)
ANION GAP: 9 (ref 5–15)
AST: 22 U/L (ref 15–41)
BUN: 29 mg/dL — ABNORMAL HIGH (ref 8–23)
CHLORIDE: 103 mmol/L (ref 98–111)
CO2: 24 mmol/L (ref 22–32)
Calcium: 9.2 mg/dL (ref 8.9–10.3)
Creatinine, Ser: 1.77 mg/dL — ABNORMAL HIGH (ref 0.44–1.00)
GFR calc Af Amer: 28 mL/min — ABNORMAL LOW (ref >60)
GFR calc non Af Amer: 24 mL/min — ABNORMAL LOW (ref >60)
GLUCOSE: 138 mg/dL — AB (ref 70–99)
POTASSIUM: 3.9 mmol/L (ref 3.5–5.1)
SODIUM: 136 mmol/L (ref 135–145)
Total Bilirubin: 0.7 mg/dL (ref 0.3–1.2)
Total Protein: 6.5 g/dL (ref 6.5–8.1)

## 2017-09-17 LAB — TSH: TSH: 0.212 u[IU]/mL — AB (ref 0.350–4.500)

## 2017-09-17 MED ORDER — LEVOTHYROXINE SODIUM 25 MCG PO TABS
125.0000 ug | ORAL_TABLET | Freq: Every day | ORAL | Status: DC
  Filled 2017-09-17: qty 1

## 2017-09-17 MED ORDER — ONDANSETRON HCL 4 MG/2ML IJ SOLN: 4.0000 mg | Freq: Four times a day (QID) | INTRAMUSCULAR | Status: DC | PRN

## 2017-09-17 MED ORDER — POTASSIUM CHLORIDE CRYS ER 10 MEQ PO TBCR
10.0000 meq | EXTENDED_RELEASE_TABLET | Freq: Every day | ORAL | Status: DC
  Administered 2017-09-17: 10 meq via ORAL
  Filled 2017-09-17 (×6): qty 1

## 2017-09-17 MED ORDER — ORAL CARE MOUTH RINSE
15.0000 mL | Freq: Two times a day (BID) | OROMUCOSAL | Status: DC
  Administered 2017-09-18 – 2017-09-22 (×4): 15 mL via OROMUCOSAL

## 2017-09-17 MED ORDER — VITAMIN D 1000 UNITS PO TABS
2000.0000 [IU] | ORAL_TABLET | Freq: Every day | ORAL | Status: DC
  Administered 2017-09-17: 2000 [IU] via ORAL
  Filled 2017-09-17 (×2): qty 2

## 2017-09-17 MED ORDER — SODIUM CHLORIDE 0.9 % IV SOLN
Freq: Once | INTRAVENOUS | Status: AC
  Administered 2017-09-17: 01:00:00 via INTRAVENOUS

## 2017-09-17 MED ORDER — POLYVINYL ALCOHOL 1.4 % OP SOLN: 1.0000 [drp] | OPHTHALMIC | Status: DC | PRN

## 2017-09-17 MED ORDER — SODIUM CHLORIDE 0.9 % IV SOLN
INTRAVENOUS | Status: DC
  Administered 2017-09-17 – 2017-09-23 (×14): via INTRAVENOUS

## 2017-09-17 MED ORDER — CIPROFLOXACIN IN D5W 400 MG/200ML IV SOLN
400.0000 mg | INTRAVENOUS | Status: DC
  Administered 2017-09-17: 400 mg via INTRAVENOUS
  Filled 2017-09-17: qty 200

## 2017-09-17 MED ORDER — ACETAMINOPHEN 650 MG RE SUPP: 650.0000 mg | Freq: Four times a day (QID) | RECTAL | Status: DC | PRN

## 2017-09-17 MED ORDER — DULOXETINE HCL 60 MG PO CPEP
60.0000 mg | ORAL_CAPSULE | Freq: Every morning | ORAL | Status: DC
Start: 2017-09-17 — End: 2017-09-19
  Administered 2017-09-17: 60 mg via ORAL
  Filled 2017-09-17 (×2): qty 1

## 2017-09-17 MED ORDER — ACETAMINOPHEN 325 MG PO TABS: 650.0000 mg | ORAL_TABLET | Freq: Four times a day (QID) | ORAL | Status: DC | PRN

## 2017-09-17 MED ORDER — QUETIAPINE FUMARATE 25 MG PO TABS
50.0000 mg | ORAL_TABLET | Freq: Every day | ORAL | Status: DC
  Filled 2017-09-17 (×2): qty 2

## 2017-09-17 MED ORDER — ONDANSETRON HCL 4 MG PO TABS: 4.0000 mg | ORAL_TABLET | Freq: Four times a day (QID) | ORAL | Status: DC | PRN

## 2017-09-17 MED ORDER — ATORVASTATIN CALCIUM 40 MG PO TABS
80.0000 mg | ORAL_TABLET | Freq: Every day | ORAL | Status: DC
  Administered 2017-09-17: 80 mg via ORAL
  Filled 2017-09-17 (×2): qty 2

## 2017-09-17 MED ORDER — FLUTICASONE PROPIONATE 50 MCG/ACT NA SUSP
2.0000 | Freq: Every day | NASAL | Status: DC
  Administered 2017-09-18 – 2017-09-21 (×4): 2 via NASAL
  Filled 2017-09-17 (×2): qty 16

## 2017-09-17 MED ORDER — CALCIUM CARBONATE-VITAMIN D 500-200 MG-UNIT PO TABS
1.0000 | ORAL_TABLET | Freq: Every day | ORAL | Status: DC
  Administered 2017-09-17: 1 via ORAL
  Filled 2017-09-17 (×2): qty 1

## 2017-09-17 MED ORDER — CIPROFLOXACIN IN D5W 400 MG/200ML IV SOLN
400.0000 mg | Freq: Once | INTRAVENOUS | Status: AC
Start: 2017-09-17 — End: 2017-09-17
  Administered 2017-09-17: 400 mg via INTRAVENOUS
  Filled 2017-09-17: qty 200

## 2017-09-17 MED ORDER — CIPROFLOXACIN IN D5W 400 MG/200ML IV SOLN: 400.0000 mg | Freq: Two times a day (BID) | INTRAVENOUS | Status: DC

## 2017-09-17 MED ORDER — ENOXAPARIN SODIUM 30 MG/0.3ML ~~LOC~~ SOLN
30.0000 mg | SUBCUTANEOUS | Status: DC
  Administered 2017-09-17 – 2017-09-19 (×2): 30 mg via SUBCUTANEOUS
  Filled 2017-09-17 (×3): qty 0.3

## 2017-09-17 MED ORDER — GABAPENTIN 400 MG PO CAPS
400.0000 mg | ORAL_CAPSULE | Freq: Every day | ORAL | Status: DC
  Administered 2017-09-17: 400 mg via ORAL
  Filled 2017-09-17 (×2): qty 1

## 2017-09-17 NOTE — H&P (Signed)
History and Physical    [name] UVO:[id number] DOB: 1926-01-28 DOA: 09/16/2017  PCP: Chipper Herb, MD   Patient coming from: Home via EMS  Chief Complaint: Altered mental status  HPI: [name] is a 84 y.o. female with medical history significant for hypertension, dyslipidemia, hypothyroidism, low back pain with neuropathy, and recent treatment with Augmentin for UTI who was brought to the ED via EMS due to some weakness and altered mental status.  EMS reported some initial hypotension with blood pressure 80/50 for which she was given IV fluid bolus with improvement.  She was recently being treated for UTI with Augmentin and actually denied any urinary symptoms or other complaints.  She is much more alert and awake this morning and has been started on IV fluid as well as IV ciprofloxacin for her UTI.  Urine cultures are currently pending.   ED Course: Stable vital signs with leukocytosis of 25,000 noted.  Hemoglobin of 9 which appears to be stable compared to prior.  Creatinine is noted to be 1.77 with baseline of 0.8-0.9.  She has been started on some IV fluid and IV ciprofloxacin.  She appears more awake and alert this morning and is able to answer questions.  Review of Systems: All others reviewed and otherwise negative.  Past Medical History:  Diagnosis Date  . Adhesion of abdominal wall    "continues to have abdominal pain pain intermittent right abdominal side. "scar tissue"  . Arthritis   . Breast cyst   . Bronchitis, asthmatic    'bronchitis that will go into an asthma like attack but haven't had it in years'  . Colon polyps   . Elevated blood sugar   . Esophageal stricture    "occ. problems with swallowing water"  . Full dentures   . GI bleed   . Goiter   . History of blood transfusion   . History of hiatal hernia    repaired over 20 years ago  . ITP (idiopathic thrombocytopenic purpura)    not a problem now.  . Other and unspecified hyperlipidemia     . Peptic ulcer disease   . Rhinitis   . Unspecified essential hypertension   . Unspecified hypothyroidism     Past Surgical History:  Procedure Laterality Date  . ABDOMINAL HYSTERECTOMY    . APPENDECTOMY    . BACK SURGERY     x1 Lumbar, x2 cervical anterior and posterior .  Marland Kitchen BREAST SURGERY     x3 biopsy  . cataract surgery Bilateral   . CHOLECYSTECTOMY    . EYE SURGERY Bilateral 2007   cataracts   . HEMORRHOID SURGERY    . HERNIA REPAIR    . IR RADIOLOGIST EVAL & MGMT  12/24/2016  . IR SACROPLASTY BILATERAL  12/28/2016  . JOINT REPLACEMENT    . LUMBAR LAMINECTOMY N/A 05/16/2015   Procedure: MICRODISCECTOMY Lumbar 3-Lumbar 4;  Surgeon: Jessy Oto, MD;  Location: [facility];  Service: Orthopedics;  Laterality: N/A;  . 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 THYROID SURGERY     Total "goiter removal"  . TOTAL KNEE ARTHROPLASTY Right 2010     reports that she is a non-smoker but has been exposed to tobacco smoke. The exposure started about 72 years ago. She has never used smokeless tobacco. She reports that she does not drink alcohol or use drugs.  Allergies  Allergen Reactions  . Prednisone Other (See Comments)    Bloated.  Unable to determine if intolerance with water retention, or Possible Allergic response with swelling.    Family History  Problem Relation Age of Onset  . Lung cancer Father   . Heart disease Mother        Unkown.  Died age 42  . Emphysema Sister     Prior to Admission medications   Medication Sig Start Date End Date Taking? Authorizing Provider  amoxicillin-clavulanate (AUGMENTIN) 875-125 MG tablet Take 1 tablet by mouth daily. 09/09/17  Yes Chipper Herb, MD  amoxicillin-clavulanate (AUGMENTIN) 875-125 MG tablet Take 1 tablet by mouth 2 (two) times daily. 09/09/17  Yes Chipper Herb, MD  atorvastatin (LIPITOR) 80 MG tablet Take 1 tablet (80 mg total) by mouth daily. 06/26/17  Yes Chipper Herb, MD  calcium citrate-vitamin D  (CITRACAL+D) 315-200 MG-UNIT per tablet Take 1 tablet by mouth daily.   Yes [provider]  Cholecalciferol (VITAMIN D3) 2000 UNITS TABS Take 1 tablet by mouth daily.     Yes [provider]  DULoxetine (CYMBALTA) 60 MG capsule Take 60 mg by mouth every morning.   Yes [provider]  fluticasone (FLONASE) 50 MCG/ACT nasal spray Place 2 sprays into both nostrils daily. 06/24/17  Yes Chipper Herb, MD  gabapentin (NEURONTIN) 400 MG capsule Take 1 capsule by mouth daily. 04/05/17  Yes [provider]  levothyroxine (SYNTHROID, LEVOTHROID) 125 MCG tablet Take 1 tablet (125 mcg total) by mouth daily before breakfast. 02/04/17  Yes Isaac Bliss, Rayford Halsted, MD  meclizine (ANTIVERT) 25 MG tablet TAKE 1/2 TABLET 3 TIMES A DAY AS NEEDED FOR DIZZINESS 01/04/17  Yes Chipper Herb, MD  meclizine (ANTIVERT) 25 MG tablet TAKE 1/2 TABLET 3 TIMES A DAY AS NEEDED FOR DIZZINESS 09/02/17  Yes Chipper Herb, MD  Multiple Vitamin (MULTIVITAMIN WITH MINERALS) TABS tablet Take 1 tablet by mouth daily.   Yes [provider]  polyvinyl alcohol (LIQUIFILM TEARS) 1.4 % ophthalmic solution Place 1 drop into both eyes as needed for dry eyes.   Yes [provider]  potassium chloride (K-DUR) 10 MEQ tablet Take 1 tablet (10 mEq total) by mouth daily. 07/26/17  Yes Chipper Herb, MD  QUEtiapine (SEROQUEL) 50 MG tablet Take 50 mg by mouth at bedtime.  05/06/17  Yes [provider]  valsartan-hydrochlorothiazide (DIOVAN-HCT) 320-25 MG tablet TAKE 1 TABLET DAILY 06/21/17  Yes Chipper Herb, MD  cephALEXin (KEFLEX) 500 MG capsule Take 1 capsule (500 mg total) by mouth 4 (four) times daily. 08/17/17   Milton Ferguson, MD    Physical Exam: Vitals:   09/17/17 0300 09/17/17 0400 09/17/17 0454 09/17/17 0843  BP: (!) 139/55 (!) 133/56 115/82   Pulse:  73 69   Resp: 14 16    Temp:   97.6 F (36.4 C)   TempSrc:   Oral   SpO2:  94% 98% 94%  Weight:   70.8 kg (156 lb  1.4 oz)   Height:        Constitutional: NAD, calm, comfortable Vitals:   09/17/17 0300 09/17/17 0400 09/17/17 0454 09/17/17 0843  BP: (!) 139/55 (!) 133/56 115/82   Pulse:  73 69   Resp: 14 16    Temp:   97.6 F (36.4 C)   TempSrc:   Oral   SpO2:  94% 98% 94%  Weight:   70.8 kg (156 lb 1.4 oz)   Height:       Eyes: lids and conjunctivae normal ENMT: Mucous membranes  are moist.  Neck: normal, supple Respiratory: clear to auscultation bilaterally. Normal respiratory effort. No accessory muscle use.  Cardiovascular: Regular rate and rhythm, no murmurs. No extremity edema. Abdomen: no tenderness, no distention. Bowel sounds positive.  Musculoskeletal:  No joint deformity upper and lower extremities.   Skin: no rashes, lesions, ulcers.   Labs on Admission: I have personally reviewed following labs and imaging studies  CBC: Recent Labs  Lab 09/16/17 2314  WBC 25.0*  HGB 9.0*  HCT 29.5*  MCV 94.6  PLT 761   Basic Metabolic Panel: Recent Labs  Lab 09/16/17 2314  NA 136  K 3.9  CL 103  CO2 24  GLUCOSE 138*  BUN 29*  CREATININE 1.77*  CALCIUM 9.2   GFR: Estimated Creatinine Clearance: 20.8 mL/min (A) (by C-G formula based on SCr of 1.77 mg/dL (H)). Liver Function Tests: Recent Labs  Lab 09/16/17 2314  AST 22  ALT 13  ALKPHOS 92  BILITOT 0.7  PROT 6.5  ALBUMIN 2.4*   No results for input(s): LIPASE, AMYLASE in the last 168 hours. No results for input(s): AMMONIA in the last 168 hours. Coagulation Profile: No results for input(s): INR, PROTIME in the last 168 hours. Cardiac Enzymes: No results for input(s): CKTOTAL, CKMB, CKMBINDEX, TROPONINI in the last 168 hours. BNP (last 3 results) No results for input(s): PROBNP in the last 8760 hours. HbA1C: No results for input(s): HGBA1C in the last 72 hours. CBG: Recent Labs  Lab 09/16/17 2303  GLUCAP 132*   Lipid Profile: No results for input(s): CHOL, HDL, LDLCALC, TRIG, CHOLHDL, LDLDIRECT in the last  72 hours. Thyroid Function Tests: No results for input(s): TSH, T4TOTAL, FREET4, T3FREE, THYROIDAB in the last 72 hours. Anemia Panel: No results for input(s): VITAMINB12, FOLATE, FERRITIN, TIBC, IRON, RETICCTPCT in the last 72 hours. Urine analysis:    Component Value Date/Time   COLORURINE YELLOW 09/16/2017 2320   APPEARANCEUR HAZY (A) 09/16/2017 2320   APPEARANCEUR Clear 08/28/2017 1048   LABSPEC 1.013 09/16/2017 2320   PHURINE 7.0 09/16/2017 2320   GLUCOSEU NEGATIVE 09/16/2017 2320   HGBUR NEGATIVE 09/16/2017 2320   BILIRUBINUR NEGATIVE 09/16/2017 2320   BILIRUBINUR Negative 08/28/2017 1048   KETONESUR NEGATIVE 09/16/2017 2320   PROTEINUR NEGATIVE 09/16/2017 2320   UROBILINOGEN negative 05/22/2013 1249   UROBILINOGEN 0.2 12/28/2006 0933   NITRITE NEGATIVE 09/16/2017 2320   LEUKOCYTESUR SMALL (A) 09/16/2017 2320   LEUKOCYTESUR Negative 08/28/2017 1048    Radiological Exams on Admission: Dg Chest 2 View  Result Date: 09/17/2017 CLINICAL DATA:  Cough.  Altered mental status.  Recent fall. EXAM: CHEST - 2 VIEW COMPARISON:  08/17/2017 FINDINGS: The cardiomediastinal contours are unchanged, heart upper normal in size. Atherosclerosis of the thoracic aorta. Minimal bibasilar atelectasis. Pulmonary vasculature is normal. No consolidation, pleural effusion, or pneumothorax. No acute osseous abnormalities are seen. Degenerative change of both shoulders. Surgical clips at the thoracic inlet. IMPRESSION: 1. No acute findings or change from prior exam. 2.  Aortic Atherosclerosis (ICD10-I70.0). Electronically Signed   By: Jeb Levering M.D.   On: 09/17/2017 01:08   Ct Head Wo Contrast  Result Date: 09/17/2017 CLINICAL DATA:  Altered mental status and weakness. Altered level of consciousness. Recent fall. EXAM: CT HEAD WITHOUT CONTRAST TECHNIQUE: Contiguous axial images were obtained from the base of the skull through the vertex without intravenous contrast. COMPARISON:  Head CT 08/17/2017  FINDINGS: Brain: Unchanged atrophy and chronic small vessel ischemia. No intracranial hemorrhage, mass effect, or midline shift. No hydrocephalus. The  basilar cisterns are patent. No evidence of territorial infarct or acute ischemia. No extra-axial or intracranial fluid collection. Vascular: Atherosclerosis of skullbase vasculature without hyperdense vessel or abnormal calcification. Skull: No fracture or focal lesion. Sinuses/Orbits: No acute finding.  Bilateral cataract resection. Other: None. IMPRESSION: 1.  No acute intracranial abnormality. 2. Unchanged atrophy and chronic small vessel ischemia. Electronically Signed   By: Jeb Levering M.D.   On: 09/17/2017 00:56   Dg Hip Unilat With Pelvis 2-3 Views Left  Result Date: 09/17/2017 CLINICAL DATA:  Fall on the 4th of July with left hip pain EXAM: DG HIP (WITH OR WITHOUT PELVIS) 2-3V LEFT COMPARISON:  Pelvis CT 11/27/2016 FINDINGS: Remote right obturator ring and iliac wing fractures. Remote bilateral sacral ala fracture and sacroplasty. No acute fracture or hip dislocation. Prominent osteopenia. Mild acetabular spurring on the left. IMPRESSION: 1. No acute finding. 2. Osteopenia with remote pelvic fractures. Electronically Signed   By: Monte Fantasia M.D.   On: 09/17/2017 01:09    Assessment/Plan Principal Problem:   Altered mental status Active Problems:   Essential hypertension   Hyperlipidemia   Hypothyroidism   AKI (acute kidney injury) ([place])    1. Acute metabolic encephalopathy.  This is likely secondary to UTI and dehydration with AKI.  Continue to monitor with neurochecks, the patient already appears to be much improved this morning.  Continue IV fluid and antibiotics. 2. UTI.  Follow urine cultures and continue on IV ciprofloxacin.  Patient does have significant leukocytosis and monitor CBC in a.m.  She did complete a recent course of Augmentin. 3. AKI-likely prerenal.  Continue IV fluid hydration and advance diet today since she is  more awake and alert.  Follow-up renal panel in a.m. and avoid nephrotoxic agents to include her home blood pressure medication of Diovan HCT. 4. Anemia-stable.  No overt bleed, continue to trend. 5. Hypothyroidism.  Continue Synthroid and check TSH. 6. Dyslipidemia.  Continue statin.   DVT prophylaxis: Lovenox  Code Status: Full Family Communication: None at bedside Disposition Plan: IV hydration and treatment for UTI Consults called: None Admission status: Inpatient, MedSurg   Pratik Darleen Crocker DO [facility] Pager [phone]  If 7PM-7AM, please contact night-coverage [email or web address] Password [email or web address]  09/17/2017, 10:21 AM

## 2017-09-17 NOTE — ED Provider Notes (Signed)
[name] EMERGENCY DEPARTMENT Provider Note   CSN: [id number] Arrival date & time: 09/16/17  2255  Time seen 23:10 PM    History   Chief Complaint Chief Complaint  Patient presents with  . Altered Mental Status   Level 5 caveat for age and altered mental status  HPI [name] is a 84 y.o. female.  HPI EMS brought the patient in tonight.  They were told that the daughter said she seemed fine yesterday but today she seemed weak and seemed to have altered mental status.  They report her initial blood pressure was 80/50 and gave her a 500 cc bolus of fluid and her blood pressure improved to 104/40.  Her PCO2 was 28-30, her pulse ox was 96% on room air, her initial heart rate was 102.  Their initial stroke screen was negative.  Daughter told him that patient fell on July 4 and has been complaining of hip pain.  She also has been on an antibiotic, possibly Augmentin for a UTI. Patient states she is here for "possible heart problem".  She denies chest pain but states she did feel a little short of breath tonight.  She had a cough today that was dry.  She denies fever, chills, rhinorrhea, nausea, vomiting, diarrhea, or any urinary symptoms.  She states she had a mild sore throat earlier.  PCP Chipper Herb, MD   Past Medical History:  Diagnosis Date  . Adhesion of abdominal wall    "continues to have abdominal pain pain intermittent right abdominal side. "scar tissue"  . Arthritis   . Breast cyst   . Bronchitis, asthmatic    'bronchitis that will go into an asthma like attack but haven't had it in years'  . Colon polyps   . Elevated blood sugar   . Esophageal stricture    "occ. problems with swallowing water"  . Full dentures   . GI bleed   . Goiter   . History of blood transfusion   . History of hiatal hernia    repaired over 20 years ago  . ITP (idiopathic thrombocytopenic purpura)    not a problem now.  . Other and unspecified hyperlipidemia   . Peptic ulcer  disease   . Rhinitis   . Unspecified essential hypertension   . Unspecified hypothyroidism     Patient Active Problem List   Diagnosis Date Noted  . Altered mental status 09/17/2017  . Confusion and disorientation 02/01/2017  . UTI (urinary tract infection) 02/01/2017  . AKI (acute kidney injury) ([place]) 02/01/2017  . Lumbar radiculopathy 03/14/2016  . HNP (herniated nucleus pulposus), cervical 05/16/2015    Class: Chronic  . Herniated nucleus pulposus, lumbar 05/16/2015  . Abnormal EKG 04/21/2015  . Thrombocytopenia ([place]) 08/18/2014  . Insomnia 11/24/2013  . Impingement syndrome of left shoulder 09/18/2013  . S/P arthroscopy of shoulder 09/18/2013  . Low back pain 03/17/2013  . Neuropathy 03/17/2013  . Metabolic syndrome 01/75/1025  . Overweight (BMI 25.0-29.9) 03/17/2013  . Vitamin D deficiency 12/01/2012  . Chronic insomnia 12/01/2012  . Fibrocystic breast changes 05/21/2011  . Essential hypertension   . Hyperlipidemia   . Peptic ulcer disease   . ITP (idiopathic thrombocytopenic purpura)   . Rhinitis   . Elevated blood sugar   . Hypothyroidism   . Goiter   . Colon polyps   . Adhesion of abdominal wall   . Esophageal stricture     Past Surgical History:  Procedure Laterality Date  . ABDOMINAL HYSTERECTOMY    .  APPENDECTOMY    . BACK SURGERY     x1 Lumbar, x2 cervical anterior and posterior .  Marland Kitchen BREAST SURGERY     x3 biopsy  . cataract surgery Bilateral   . CHOLECYSTECTOMY    . EYE SURGERY Bilateral 2007   cataracts   . HEMORRHOID SURGERY    . HERNIA REPAIR    . IR RADIOLOGIST EVAL & MGMT  12/24/2016  . IR SACROPLASTY BILATERAL  12/28/2016  . JOINT REPLACEMENT    . LUMBAR LAMINECTOMY N/A 05/16/2015   Procedure: MICRODISCECTOMY Lumbar 3-Lumbar 4;  Surgeon: Jessy Oto, MD;  Location: [facility];  Service: Orthopedics;  Laterality: N/A;  . 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 THYROID SURGERY     Total "goiter removal"  . TOTAL KNEE  ARTHROPLASTY Right 2010     OB History   None      Home Medications    Prior to Admission medications   Medication Sig Start Date End Date Taking? Authorizing Provider  amoxicillin-clavulanate (AUGMENTIN) 875-125 MG tablet Take 1 tablet by mouth daily. 09/09/17  Yes Chipper Herb, MD  amoxicillin-clavulanate (AUGMENTIN) 875-125 MG tablet Take 1 tablet by mouth 2 (two) times daily. 09/09/17  Yes Chipper Herb, MD  atorvastatin (LIPITOR) 80 MG tablet Take 1 tablet (80 mg total) by mouth daily. 06/26/17  Yes Chipper Herb, MD  calcium citrate-vitamin D (CITRACAL+D) 315-200 MG-UNIT per tablet Take 1 tablet by mouth daily.   Yes [provider]  Cholecalciferol (VITAMIN D3) 2000 UNITS TABS Take 1 tablet by mouth daily.     Yes [provider]  DULoxetine (CYMBALTA) 60 MG capsule Take 60 mg by mouth every morning.   Yes [provider]  fluticasone (FLONASE) 50 MCG/ACT nasal spray Place 2 sprays into both nostrils daily. 06/24/17  Yes Chipper Herb, MD  gabapentin (NEURONTIN) 400 MG capsule Take 1 capsule by mouth daily. 04/05/17  Yes [provider]  levothyroxine (SYNTHROID, LEVOTHROID) 125 MCG tablet Take 1 tablet (125 mcg total) by mouth daily before breakfast. 02/04/17  Yes Isaac Bliss, Rayford Halsted, MD  meclizine (ANTIVERT) 25 MG tablet TAKE 1/2 TABLET 3 TIMES A DAY AS NEEDED FOR DIZZINESS 01/04/17  Yes Chipper Herb, MD  meclizine (ANTIVERT) 25 MG tablet TAKE 1/2 TABLET 3 TIMES A DAY AS NEEDED FOR DIZZINESS 09/02/17  Yes Chipper Herb, MD  Multiple Vitamin (MULTIVITAMIN WITH MINERALS) TABS tablet Take 1 tablet by mouth daily.   Yes [provider]  polyvinyl alcohol (LIQUIFILM TEARS) 1.4 % ophthalmic solution Place 1 drop into both eyes as needed for dry eyes.   Yes [provider]  potassium chloride (K-DUR) 10 MEQ tablet Take 1 tablet (10 mEq total) by mouth daily. 07/26/17  Yes Chipper Herb, MD  QUEtiapine (SEROQUEL) 50 MG  tablet Take 50 mg by mouth at bedtime.  05/06/17  Yes [provider]  valsartan-hydrochlorothiazide (DIOVAN-HCT) 320-25 MG tablet TAKE 1 TABLET DAILY 06/21/17  Yes Chipper Herb, MD  cephALEXin (KEFLEX) 500 MG capsule Take 1 capsule (500 mg total) by mouth 4 (four) times daily. 08/17/17   Milton Ferguson, MD    Family History Family History  Problem Relation Age of Onset  . Lung cancer Father   . Heart disease Mother        Unkown.  Died age 63  . Emphysema Sister     Social History Social History   Tobacco Use  .  Smoking status: Passive Smoke Exposure - Never Smoker  . Smokeless tobacco: Never Used  Substance Use Topics  . Alcohol use: No  . Drug use: No  lives at home Lives with son   Allergies   Prednisone   Review of Systems Review of Systems  All other systems reviewed and are negative.    Physical Exam Updated Vital Signs BP 116/67   Pulse 67   Temp 97.8 F (36.6 C) (Oral)   Resp 19   Ht 5\' 5"  (1.651 m)   Wt 68 kg (150 lb)   SpO2 96%   BMI 24.96 kg/m   Vital signs normal    Physical Exam  Constitutional: She appears well-developed and well-nourished.  Non-toxic appearance. She does not appear ill. No distress.  HENT:  Head: Normocephalic and atraumatic.  Right Ear: External ear normal.  Left Ear: External ear normal.  Nose: Nose normal. No mucosal edema or rhinorrhea.  Mouth/Throat: Oropharynx is clear and moist and mucous membranes are normal. No dental abscesses or uvula swelling.  Eyes: Pupils are equal, round, and reactive to light. Conjunctivae and EOM are normal.  Neck: Normal range of motion and full passive range of motion without pain. Neck supple.  Cardiovascular: Normal rate, regular rhythm and normal heart sounds. Exam reveals no gallop and no friction rub.  No murmur heard. Pulmonary/Chest: Effort normal and breath sounds normal. No respiratory distress. She has no wheezes. She has no rhonchi. She has no rales. She exhibits no  tenderness and no crepitus.  Abdominal: Soft. Normal appearance and bowel sounds are normal. She exhibits no distension. There is no tenderness. There is no rebound and no guarding.  Musculoskeletal: Normal range of motion. She exhibits no edema or tenderness.  Moves all extremities well.   Neurological: She is alert. She has normal strength. No cranial nerve deficit.  Grip strengths are equal, there is no pronator drift, she seems to have a little more trouble lifting her left leg off the stretcher than her right but she is able to do it.  Patient is confused about the year.  Skin: Skin is warm, dry and intact. No rash noted. No erythema. No pallor.  Psychiatric: She has a normal mood and affect. Her speech is normal and behavior is normal. Her mood appears not anxious.  Nursing note and vitals reviewed.    ED Treatments / Results  Labs (all labs ordered are listed, but only abnormal results are displayed) Results for orders placed or performed during the hospital encounter of 09/16/17  Culture, blood (Routine X 2) w Reflex to ID Panel  Result Value Ref Range   Specimen Description BLOOD RIGHT WRIST DRAWN BY RN    Special Requests      BOTTLES DRAWN AEROBIC AND ANAEROBIC Blood Culture adequate volume Performed at [facility], [address], [place], [place] [address]    Culture PENDING    Report Status PENDING   Culture, blood (Routine X 2) w Reflex to ID Panel  Result Value Ref Range   Specimen Description BLOOD LEFT WRIST    Special Requests      BOTTLES DRAWN AEROBIC AND ANAEROBIC Blood Culture adequate volume Performed at [facility], [address] [place], [place] [address]    Culture PENDING    Report Status PENDING   Comprehensive metabolic panel  Result Value Ref Range   Sodium 136 135 - 145 mmol/L   Potassium 3.9 3.5 - 5.1 mmol/L   Chloride 103 98 - 111  mmol/L   CO2 24 22 - 32 mmol/L   Glucose, Bld 138 (H) 70 - 99 mg/dL   BUN 29 (H) 8 - 23 mg/dL   Creatinine,  Ser 1.77 (H) 0.44 - 1.00 mg/dL   Calcium 9.2 8.9 - 10.3 mg/dL   Total Protein 6.5 6.5 - 8.1 g/dL   Albumin 2.4 (L) 3.5 - 5.0 g/dL   AST 22 15 - 41 U/L   ALT 13 0 - 44 U/L   Alkaline Phosphatase 92 38 - 126 U/L   Total Bilirubin 0.7 0.3 - 1.2 mg/dL   GFR calc non Af Amer 24 (L) >60 mL/min   GFR calc Af Amer 28 (L) >60 mL/min   Anion gap 9 5 - 15  CBC  Result Value Ref Range   WBC 25.0 (H) 4.0 - 10.5 K/uL   RBC 3.12 (L) 3.87 - 5.11 MIL/uL   Hemoglobin 9.0 (L) 12.0 - 15.0 g/dL   HCT 29.5 (L) 36.0 - 46.0 %   MCV 94.6 78.0 - 100.0 fL   MCH 28.8 26.0 - 34.0 pg   MCHC 30.5 30.0 - 36.0 g/dL   RDW 12.9 11.5 - 15.5 %   Platelets 185 150 - 400 K/uL  Urinalysis, Routine w reflex microscopic  Result Value Ref Range   Color, Urine YELLOW YELLOW   APPearance HAZY (A) CLEAR   Specific Gravity, Urine 1.013 1.005 - 1.030   pH 7.0 5.0 - 8.0   Glucose, UA NEGATIVE NEGATIVE mg/dL   Hgb urine dipstick NEGATIVE NEGATIVE   Bilirubin Urine NEGATIVE NEGATIVE   Ketones, ur NEGATIVE NEGATIVE mg/dL   Protein, ur NEGATIVE NEGATIVE mg/dL   Nitrite NEGATIVE NEGATIVE   Leukocytes, UA SMALL (A) NEGATIVE   RBC / HPF 0-5 0 - 5 RBC/hpf   WBC, UA 21-50 0 - 5 WBC/hpf   Bacteria, UA RARE (A) NONE SEEN   Squamous Epithelial / LPF 6-10 0 - 5   Mucus PRESENT    Hyaline Casts, UA PRESENT   CBG monitoring, ED  Result Value Ref Range   Glucose-Capillary 132 (H) 70 - 99 mg/dL  I-Stat CG4 Lactic Acid, ED  Result Value Ref Range   Lactic Acid, Venous 1.76 0.5 - 1.9 mmol/L  I-Stat CG4 Lactic Acid, ED  Result Value Ref Range   Lactic Acid, Venous 0.66 0.5 - 1.9 mmol/L   Laboratory interpretation all normal except leukocytosis, anemia, renal insufficiency    EKG None  Radiology Dg Chest 2 View  Result Date: 09/17/2017 CLINICAL DATA:  Cough.  Altered mental status.  Recent fall. EXAM: CHEST - 2 VIEW COMPARISON:  08/17/2017 FINDINGS: The cardiomediastinal contours are unchanged, heart upper normal in size.  Atherosclerosis of the thoracic aorta. Minimal bibasilar atelectasis. Pulmonary vasculature is normal. No consolidation, pleural effusion, or pneumothorax. No acute osseous abnormalities are seen. Degenerative change of both shoulders. Surgical clips at the thoracic inlet. IMPRESSION: 1. No acute findings or change from prior exam. 2.  Aortic Atherosclerosis (ICD10-I70.0). Electronically Signed   By: Jeb Levering M.D.   On: 09/17/2017 01:08   Ct Head Wo Contrast  Result Date: 09/17/2017 CLINICAL DATA:  Altered mental status and weakness. Altered level of consciousness. Recent fall. EXAM: CT HEAD WITHOUT CONTRAST TECHNIQUE: Contiguous axial images were obtained from the base of the skull through the vertex without intravenous contrast. COMPARISON:  Head CT 08/17/2017 FINDINGS: Brain: Unchanged atrophy and chronic small vessel ischemia. No intracranial hemorrhage, mass effect, or midline shift. No hydrocephalus. The basilar cisterns  are patent. No evidence of territorial infarct or acute ischemia. No extra-axial or intracranial fluid collection. Vascular: Atherosclerosis of skullbase vasculature without hyperdense vessel or abnormal calcification. Skull: No fracture or focal lesion. Sinuses/Orbits: No acute finding.  Bilateral cataract resection. Other: None. IMPRESSION: 1.  No acute intracranial abnormality. 2. Unchanged atrophy and chronic small vessel ischemia. Electronically Signed   By: Jeb Levering M.D.   On: 09/17/2017 00:56   Dg Hip Unilat With Pelvis 2-3 Views Left  Result Date: 09/17/2017 CLINICAL DATA:  Fall on the 4th of July with left hip pain EXAM: DG HIP (WITH OR WITHOUT PELVIS) 2-3V LEFT COMPARISON:  Pelvis CT 11/27/2016 FINDINGS: Remote right obturator ring and iliac wing fractures. Remote bilateral sacral ala fracture and sacroplasty. No acute fracture or hip dislocation. Prominent osteopenia. Mild acetabular spurring on the left. IMPRESSION: 1. No acute finding. 2. Osteopenia with  remote pelvic fractures. Electronically Signed   By: Monte Fantasia M.D.   On: 09/17/2017 01:09    Procedures Procedures (including critical care time)  Medications Ordered in ED Medications  ciprofloxacin (CIPRO) IVPB 400 mg (has no administration in time range)  enoxaparin (LOVENOX) injection 30 mg (30 mg Subcutaneous Given 09/17/17 0531)  0.9 %  sodium chloride infusion ( Intravenous New Bag/Given 09/17/17 0531)  acetaminophen (TYLENOL) tablet 650 mg (has no administration in time range)    Or  acetaminophen (TYLENOL) suppository 650 mg (has no administration in time range)  ondansetron (ZOFRAN) tablet 4 mg (has no administration in time range)    Or  ondansetron (ZOFRAN) injection 4 mg (has no administration in time range)  MEDLINE mouth rinse (has no administration in time range)  0.9 %  sodium chloride infusion ( Intravenous Stopped 09/17/17 0325)  ciprofloxacin (CIPRO) IVPB 400 mg (400 mg Intravenous Transfusing/Transfer 09/17/17 0509)     Initial Impression / Assessment and Plan / ED Course  I have reviewed the triage vital signs and the nursing notes.  Pertinent labs & imaging results that were available during my care of the patient were reviewed by me and considered in my medical decision making (see chart for details).    1:40 AM when I went to recheck the patient her daughter and husband are in the room.  They states she just finished her amoxicillin today for her UTI.  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  Her husband reports some confusion and states she is been talking about some dead people.  She started to fall tonight however her husband was behind her and grabbed her and helped her to sit down on the floor.  There was no injury.  At that point her speech was mumbling.  That is when they called EMS and her blood pressure was 80.  Husband states she has been eating okay but not today.  Family states the patient is not her normal self and still seems confused.  I will talk to the  hospitalist about admission.  Patient was started on IV Cipro for possible UTI.  04:00 Dr Olevia Bowens, hospitalist, will admit.   Final Clinical Impressions(s) / ED Diagnoses   Final diagnoses:  Disorientation  Urinary tract infection without hematuria, site unspecified  Hypotension, unspecified hypotension type    Plan admission  Rolland Porter, MD, Barbette Or, MD 09/17/17 [phone]

## 2017-09-18 DIAGNOSIS — I10 Essential (primary) hypertension: Secondary | ICD-10-CM

## 2017-09-18 DIAGNOSIS — E039 Hypothyroidism, unspecified: Secondary | ICD-10-CM

## 2017-09-18 DIAGNOSIS — G92 Toxic encephalopathy: Secondary | ICD-10-CM

## 2017-09-18 DIAGNOSIS — E785 Hyperlipidemia, unspecified: Secondary | ICD-10-CM

## 2017-09-18 DIAGNOSIS — N390 Urinary tract infection, site not specified: Secondary | ICD-10-CM

## 2017-09-18 MED ORDER — SULFAMETHOXAZOLE-TRIMETHOPRIM 800-160 MG PO TABS
0.5000 | ORAL_TABLET | Freq: Two times a day (BID) | ORAL | Status: DC
  Administered 2017-09-18: 0.5 via ORAL
  Filled 2017-09-18 (×2): qty 1

## 2017-09-18 MED ORDER — CIPROFLOXACIN IN D5W 400 MG/200ML IV SOLN: 400.0000 mg | INTRAVENOUS | Status: DC

## 2017-09-18 MED ORDER — LEVOTHYROXINE SODIUM 100 MCG PO TABS
100.0000 ug | ORAL_TABLET | Freq: Every day | ORAL | Status: DC
  Filled 2017-09-18: qty 1

## 2017-09-18 NOTE — Evaluation (Signed)
Physical Therapy Evaluation Patient Details Name: [name] MRN: [id number] DOB: 12/15/1925 Today's Date: 09/18/2017   History of Present Illness  [name] is a 84 y.o. female with medical history significant for hypertension, dyslipidemia, hypothyroidism, low back pain with neuropathy, and recent treatment with Augmentin for UTI who was brought to the ED via EMS due to some weakness and altered mental status.  EMS reported some initial hypotension with blood pressure 80/50 for which she was given IV fluid bolus with improvement.  She was recently being treated for UTI with Augmentin and actually denied any urinary symptoms or other complaints.  She is much more alert and awake this morning and has been started on IV fluid as well as IV ciprofloxacin for her UTI.  Urine cultures are currently pending.    Clinical Impression  Patient appears slightly confused and declined sit to stands, transfers or attempting to ambulate.  Patient able to supine to sit in bed with head of bed flat, required 2 person assist to reposition in bed when lying down mostly due to patient not attempting to pull self up using side rails.  Patient's son states that patient is usually cooperative and high functioning at home, but has been getting weak over last 2 weeks with a fall on 4th of July and hand to lowered to floor due to weakness when attempting to walk to bathroom 2 days ago.  Patient will benefit from continued physical therapy in hospital and recommended venue below to increase strength, balance, endurance for safe ADLs and gait.    Follow Up Recommendations SNF;Supervision/Assistance - 24 hour    Equipment Recommendations  None recommended by PT    Recommendations for Other Services       Precautions / Restrictions Precautions Precautions: Fall Restrictions Weight Bearing Restrictions: No      Mobility  Bed Mobility Overal bed mobility: Needs Assistance Bed Mobility: Supine to  Sit;Sit to Supine     Supine to sit: Min guard Sit to supine: Min guard   General bed mobility comments: labored movement  Transfers                    Ambulation/Gait                Stairs            Wheelchair Mobility    Modified Rankin (Stroke Patients Only)       Balance Overall balance assessment: Needs assistance Sitting-balance support: Feet unsupported;Bilateral upper extremity supported Sitting balance-Leahy Scale: Fair Sitting balance - Comments: able to long sit in bed                                     Pertinent Vitals/Pain Pain Assessment: Faces Faces Pain Scale: Hurts a little bit Pain Location: abdomen and all over per patient Pain Descriptors / Indicators: Discomfort Pain Intervention(s): Limited activity within patient's tolerance;Monitored during session    [place] expects to be discharged to:: Private residence Living Arrangements: Children(son) Available Help at Discharge: Family Type of Home: House Home Access: Ramped entrance     Home Layout: One level Home Equipment: Environmental consultant - 4 wheels;Walker - 2 wheels;Cane - single point;Shower seat;Bedside commode;Wheelchair - manual      Prior Function Level of Independence: Independent with assistive device(s)         Comments: household ambulator with RW most of time  Hand Dominance        Extremity/Trunk Assessment   Upper Extremity Assessment Upper Extremity Assessment: Generalized weakness    Lower Extremity Assessment Lower Extremity Assessment: Generalized weakness    Cervical / Trunk Assessment Cervical / Trunk Assessment: Normal  Communication   Communication: No difficulties  Cognition Arousal/Alertness: Awake/alert Behavior During Therapy: Agitated Overall Cognitive Status: Impaired/Different from baseline Area of Impairment: Safety/judgement                         Safety/Judgement: Decreased  awareness of safety     General Comments: Patient refuses most activities      General Comments      Exercises     Assessment/Plan    PT Assessment Patient needs continued PT services  PT Problem List Decreased strength;Decreased activity tolerance;Decreased balance;Decreased mobility       PT Treatment Interventions Gait training;Stair training;Functional mobility training;Therapeutic activities;Therapeutic exercise;Patient/family education    PT Goals (Current goals can be found in the Care Plan section)  Acute Rehab PT Goals Patient Stated Goal: return home with son to assist PT Goal Formulation: With patient/family Time For Goal Achievement: 10/02/17 Potential to Achieve Goals: Fair    Frequency Min 3X/week   Barriers to discharge        Co-evaluation               AM-PAC PT "6 Clicks" Daily Activity  Outcome Measure Difficulty turning over in bed (including adjusting bedclothes, sheets and blankets)?: A Little Difficulty moving from lying on back to sitting on the side of the bed? : A Little Difficulty sitting down on and standing up from a chair with arms (e.g., wheelchair, bedside commode, etc,.)?: Unable Help needed moving to and from a bed to chair (including a wheelchair)?: Total Help needed walking in hospital room?: Total Help needed climbing 3-5 steps with a railing? : Total 6 Click Score: 10    End of Session   Activity Tolerance: Treatment limited secondary to agitation;Patient limited by pain Patient left: in bed;with call bell/phone within reach;with bed alarm set;with family/visitor present Nurse Communication: Mobility status PT Visit Diagnosis: Unsteadiness on feet (R26.81);Other abnormalities of gait and mobility (R26.89);Muscle weakness (generalized) (M62.81)    Time: [id number] PT Time Calculation (min) (ACUTE ONLY): 22 min   Charges:   PT Evaluation $PT Eval Moderate Complexity: 1 Mod PT Treatments $Therapeutic Activity: 8-22  mins   PT G Codes:        12:02 PM, October 13, 2017 Lonell Grandchild, MPT Physical Therapist with [facility] [phone] office [phone] mobile phone

## 2017-09-18 NOTE — Plan of Care (Signed)
  Problem: Acute Rehab PT Goals(only PT should resolve) Goal: Pt Will Go Supine/Side To Sit Outcome: Progressing Flowsheets (Taken 09/18/2017 1204) Pt will go Supine/Side to Sit: with supervision Goal: Patient Will Transfer Sit To/From Stand Outcome: Progressing Flowsheets (Taken 09/18/2017 1204) Patient will transfer sit to/from stand: with minimal assist Goal: Pt Will Transfer Bed To Chair/Chair To Bed Outcome: Progressing Flowsheets (Taken 09/18/2017 1204) Pt will Transfer Bed to Chair/Chair to Bed: with min assist Goal: Pt Will Ambulate Outcome: Progressing Flowsheets (Taken 09/18/2017 1204) Pt will Ambulate: 25 feet;with minimal assist;with rolling walker   12:05 PM, 09/18/17 Lonell Grandchild, MPT Physical Therapist with [facility] [phone] office [phone] mobile phone

## 2017-09-18 NOTE — Progress Notes (Signed)
PROGRESS NOTE    [name]  KGU:[id number] DOB: [birth year] DOA: 09/16/2017 PCP: Chipper Herb, MD    Brief Narrative:  84 year old female with medical history significant for hypertension, dyslipidemia, hypothyroidism, low back pain with neuropathy and a recent treatment with Augmentin for UTI who was brought to the ED via EMS due to ongoing weakness and altered mental status changes.  There is concern for sepsis in the setting of partially treated UTI.   Assessment & Plan: 1-sepsis due to UTI: Patient met sepsis criteria on admission with elevated WBCs, abnormal urinalysis suggesting source of infection, low blood pressure and elevated heart rate. -patient initially treated with IV cipro -mentation still very off, and given concerns for fluoroquinolones and AMS in elderly will use Bactrim instead. -patient is not febrile todaya nd is more alert. -will follow WBC's trend -continue IVF's  2-HTN -continue monitoring VS -antihypertensive meds on hold in the setting of low BP on admission    3-hypothyroidism -continue synthroid -TSH and T4  low, dose adjusted to 159mg -repeat thyroid panel in 6 weeks  4-toxic encephalopathy: in the setting of underlying dementia -continue supportive care -minimize sedatives meds -continue tx for infection -will continue seroquel   5-neuropathy -chronic -continue neurontin  6-dyslipidemia -will continue lipitor  7-weakness/deconditioning  -will need SNF for rehab -SW aware.   DVT prophylaxis: Lovenox Code Status: Full code Family Communication: Son at bedside. Disposition Plan: Seen by physical therapy and found significantly weak and in need of a skilled nursing facility for rehabilitation at discharge.  Consultants:   None  Procedures:   See below for x-ray reports.  Antimicrobials:  Anti-infectives (From admission, onward)   Start     Dose/Rate Route Frequency Ordered Stop   09/18/17 2100  ciprofloxacin (CIPRO)  IVPB 400 mg  Status:  Discontinued     400 mg 200 mL/hr over 60 Minutes Intravenous Every 24 hours 09/18/17 0950 09/18/17 1103   09/18/17 1200  sulfamethoxazole-trimethoprim (BACTRIM DS,SEPTRA DS) 800-160 MG per tablet 0.5 tablet     0.5 tablet Oral Every 12 hours 09/18/17 1103     09/17/17 2000  ciprofloxacin (CIPRO) IVPB 400 mg  Status:  Discontinued     400 mg 200 mL/hr over 60 Minutes Intravenous Every 18 hours 09/17/17 1356 09/18/17 0950   09/17/17 1600  ciprofloxacin (CIPRO) IVPB 400 mg  Status:  Discontinued     400 mg 200 mL/hr over 60 Minutes Intravenous Every 12 hours 09/17/17 0402 09/17/17 1356   09/17/17 0400  ciprofloxacin (CIPRO) IVPB 400 mg     400 mg 200 mL/hr over 60 Minutes Intravenous  Once 09/17/17 0358 09/17/17 0509       Subjective: Afebrile, no chest pain, no nausea, no vomiting.  Refusing medications and no eating much.  Objective: Vitals:   09/17/17 1923 09/17/17 2153 09/18/17 0655 09/18/17 1431  BP:  (!) 141/56 127/67 (!) 151/69  Pulse:  (!) 101 89 88  Resp:  _0 Temp:  98.8 F (37.1 C) 98.8 F (37.1 C) 98.4 F (36.9 C)  TempSrc:  Oral Oral   SpO2: 97% 100% 98% 100%  Weight:      Height:        Intake/Output Summary (Last 24 hours) at 09/18/2017 1905 Last data filed at 09/18/2017 1700 Gross per 24 hour  Intake 2001.25 ml  Output 1300 ml  Net 701.25 ml   Filed Weights   09/16/17 2305 09/17/17 0454  Weight: 68 kg (150 lb) 70.8 kg (  156 lb 1.4 oz)    Examination: General exam: Alert, awake, oriented x 2; patient is afebrile, denies chest pain, no shortness of breath.  Refusing medications and currently no eating or drinking much. Respiratory system: Clear to auscultation. Respiratory effort normal. Cardiovascular system:RRR.  Soft systolic ejection murmur, no rubs, no gallops, no JVD.   Gastrointestinal system: Abdomen is nondistended, soft and nontender. No organomegaly or masses felt. Normal bowel sounds heard. Central nervous system:  Alert and oriented. No focal neurological deficits. Extremities: No cyanosis or clubbing, trace edema appreciated bilaterally. Skin: No rashes, lesions or ulcers Psychiatry: Poor insight, no agitations, no hallucinations.    Data Reviewed: I have personally reviewed following labs and imaging studies  CBC: Recent Labs  Lab 09/16/17 2314  WBC 25.0*  HGB 9.0*  HCT 29.5*  MCV 94.6  PLT 607   Basic Metabolic Panel: Recent Labs  Lab 09/16/17 2314  NA 136  K 3.9  CL 103  CO2 24  GLUCOSE 138*  BUN 29*  CREATININE 1.77*  CALCIUM 9.2   GFR: Estimated Creatinine Clearance: 20.8 mL/min (A) (by C-G formula based on SCr of 1.77 mg/dL (H)).   Liver Function Tests: Recent Labs  Lab 09/16/17 2314  AST 22  ALT 13  ALKPHOS 92  BILITOT 0.7  PROT 6.5  ALBUMIN 2.4*   CBG: Recent Labs  Lab 09/16/17 2303  GLUCAP 132*   Thyroid Function Tests: Recent Labs    09/17/17 1059  TSH 0.212*   Urine analysis:    Component Value Date/Time   COLORURINE YELLOW 09/16/2017 2320   APPEARANCEUR HAZY (A) 09/16/2017 2320   APPEARANCEUR Clear 08/28/2017 1048   LABSPEC 1.013 09/16/2017 2320   PHURINE 7.0 09/16/2017 2320   GLUCOSEU NEGATIVE 09/16/2017 2320   HGBUR NEGATIVE 09/16/2017 2320   BILIRUBINUR NEGATIVE 09/16/2017 2320   BILIRUBINUR Negative 08/28/2017 1048   KETONESUR NEGATIVE 09/16/2017 2320   PROTEINUR NEGATIVE 09/16/2017 2320   UROBILINOGEN negative 05/22/2013 1249   UROBILINOGEN 0.2 12/28/2006 0933   NITRITE NEGATIVE 09/16/2017 2320   LEUKOCYTESUR SMALL (A) 09/16/2017 2320   LEUKOCYTESUR Negative 08/28/2017 1048    Recent Results (from the past 240 hour(s))  Culture, blood (Routine X 2) w Reflex to ID Panel     Status: None (Preliminary result)   Collection Time: 09/16/17 11:14 PM  Result Value Ref Range Status   Specimen Description BLOOD RIGHT WRIST DRAWN BY RN  Final   Special Requests   Final    BOTTLES DRAWN AEROBIC AND ANAEROBIC Blood Culture adequate  volume   Culture   Final    NO GROWTH 2 DAYS Performed at [facility], [address], [place], [place] [address]    Report Status PENDING  Incomplete  Culture, blood (Routine X 2) w Reflex to ID Panel     Status: None (Preliminary result)   Collection Time: 09/16/17 11:43 PM  Result Value Ref Range Status   Specimen Description BLOOD LEFT WRIST  Final   Special Requests   Final    BOTTLES DRAWN AEROBIC AND ANAEROBIC Blood Culture adequate volume   Culture   Final    NO GROWTH 2 DAYS Performed at [facility], [address], [place], [place] [address]    Report Status PENDING  Incomplete         Radiology Studies: Dg Chest 2 View  Result Date: 09/17/2017 CLINICAL DATA:  Cough.  Altered mental status.  Recent fall. EXAM: CHEST - 2 VIEW COMPARISON:  08/17/2017 FINDINGS: The  cardiomediastinal contours are unchanged, heart upper normal in size. Atherosclerosis of the thoracic aorta. Minimal bibasilar atelectasis. Pulmonary vasculature is normal. No consolidation, pleural effusion, or pneumothorax. No acute osseous abnormalities are seen. Degenerative change of both shoulders. Surgical clips at the thoracic inlet. IMPRESSION: 1. No acute findings or change from prior exam. 2.  Aortic Atherosclerosis (ICD10-I70.0). Electronically Signed   By: Jeb Levering M.D.   On: 09/17/2017 01:08   Ct Head Wo Contrast  Result Date: 09/17/2017 CLINICAL DATA:  Altered mental status and weakness. Altered level of consciousness. Recent fall. EXAM: CT HEAD WITHOUT CONTRAST TECHNIQUE: Contiguous axial images were obtained from the base of the skull through the vertex without intravenous contrast. COMPARISON:  Head CT 08/17/2017 FINDINGS: Brain: Unchanged atrophy and chronic small vessel ischemia. No intracranial hemorrhage, mass effect, or midline shift. No hydrocephalus. The basilar cisterns are patent. No evidence of territorial infarct or acute ischemia. No extra-axial or intracranial fluid collection.  Vascular: Atherosclerosis of skullbase vasculature without hyperdense vessel or abnormal calcification. Skull: No fracture or focal lesion. Sinuses/Orbits: No acute finding.  Bilateral cataract resection. Other: None. IMPRESSION: 1.  No acute intracranial abnormality. 2. Unchanged atrophy and chronic small vessel ischemia. Electronically Signed   By: Jeb Levering M.D.   On: 09/17/2017 00:56   Dg Hip Unilat With Pelvis 2-3 Views Left  Result Date: 09/17/2017 CLINICAL DATA:  Fall on the 4th of July with left hip pain EXAM: DG HIP (WITH OR WITHOUT PELVIS) 2-3V LEFT COMPARISON:  Pelvis CT 11/27/2016 FINDINGS: Remote right obturator ring and iliac wing fractures. Remote bilateral sacral ala fracture and sacroplasty. No acute fracture or hip dislocation. Prominent osteopenia. Mild acetabular spurring on the left. IMPRESSION: 1. No acute finding. 2. Osteopenia with remote pelvic fractures. Electronically Signed   By: Monte Fantasia M.D.   On: 09/17/2017 01:09    Scheduled Meds: . atorvastatin  80 mg Oral Daily  . calcium-vitamin D  1 tablet Oral Daily  . cholecalciferol  2,000 Units Oral Daily  . DULoxetine  60 mg Oral q morning - 10a  . enoxaparin (LOVENOX) injection  30 mg Subcutaneous Q24H  . fluticasone  2 spray Each Nare Daily  . gabapentin  400 mg Oral Daily  . [START ON 09/19/2017] levothyroxine  100 mcg Oral QAC breakfast  . mouth rinse  15 mL Mouth Rinse BID  . potassium chloride  10 mEq Oral Daily  . QUEtiapine  50 mg Oral QHS  . sulfamethoxazole-trimethoprim  0.5 tablet Oral Q12H   Continuous Infusions: . sodium chloride 75 mL/hr at 09/18/17 1032     LOS: 1 day    Time spent: 30 minutes    Barton Dubois, MD [facility] Pager [phone]  If 7PM-7AM, please contact night-coverage [email or web address] Password [email or web address] 09/18/2017, 7:05 PM

## 2017-09-18 NOTE — Progress Notes (Signed)
Pt mildly agitated throughout the night. Refused all labs, VS and meds.

## 2017-09-19 DIAGNOSIS — I959 Hypotension, unspecified: Secondary | ICD-10-CM

## 2017-09-19 DIAGNOSIS — D62 Acute posthemorrhagic anemia: Secondary | ICD-10-CM

## 2017-09-19 DIAGNOSIS — K922 Gastrointestinal hemorrhage, unspecified: Secondary | ICD-10-CM

## 2017-09-19 DIAGNOSIS — R4182 Altered mental status, unspecified: Secondary | ICD-10-CM

## 2017-09-19 LAB — CBC WITH DIFFERENTIAL/PLATELET
BASOS PCT: 0 %
Basophils Absolute: 0 10*3/uL (ref 0.0–0.1)
EOS ABS: 0 10*3/uL (ref 0.0–0.7)
EOS PCT: 0 %
HEMATOCRIT: 20.3 % — AB (ref 36.0–46.0)
HEMOGLOBIN: 6.3 g/dL — AB (ref 12.0–15.0)
Lymphocytes Relative: 10 %
Lymphs Abs: 4.2 10*3/uL — ABNORMAL HIGH (ref 0.7–4.0)
MCH: 29.9 pg (ref 26.0–34.0)
MCHC: 31 g/dL (ref 30.0–36.0)
MCV: 96.2 fL (ref 78.0–100.0)
MONOS PCT: 1 %
Monocytes Absolute: 0.4 10*3/uL (ref 0.1–1.0)
NEUTROS ABS: 37.2 10*3/uL — AB (ref 1.7–7.7)
NEUTROS PCT: 89 %
Platelets: 140 10*3/uL — ABNORMAL LOW (ref 150–400)
RBC: 2.11 MIL/uL — ABNORMAL LOW (ref 3.87–5.11)
RDW: 13.1 % (ref 11.5–15.5)
WBC: 41.8 10*3/uL — ABNORMAL HIGH (ref 4.0–10.5)

## 2017-09-19 LAB — PREPARE RBC (CROSSMATCH)

## 2017-09-19 LAB — ABO/RH: ABO/RH(D): O POS

## 2017-09-19 MED ORDER — MORPHINE SULFATE (PF) 2 MG/ML IV SOLN
1.0000 mg | INTRAVENOUS | Status: DC | PRN
Start: 2017-09-19 — End: 2017-09-20
  Administered 2017-09-19: 2 mg via INTRAVENOUS
  Administered 2017-09-19: 1 mg via INTRAVENOUS
  Filled 2017-09-19 (×2): qty 1

## 2017-09-19 MED ORDER — LEVOTHYROXINE SODIUM 100 MCG IV SOLR
50.0000 ug | Freq: Every day | INTRAVENOUS | Status: DC
  Administered 2017-09-20 – 2017-09-23 (×4): 50 ug via INTRAVENOUS
  Filled 2017-09-19 (×7): qty 5

## 2017-09-19 MED ORDER — SODIUM CHLORIDE 0.9 % IV SOLN
8.0000 mg/h | INTRAVENOUS | Status: AC
  Administered 2017-09-19 – 2017-09-21 (×6): 8 mg/h via INTRAVENOUS
  Filled 2017-09-19 (×8): qty 80

## 2017-09-19 MED ORDER — FAMOTIDINE IN NACL 20-0.9 MG/50ML-% IV SOLN: 20.0000 mg | INTRAVENOUS | Status: DC

## 2017-09-19 MED ORDER — SODIUM CHLORIDE 0.9% IV SOLUTION
Freq: Once | INTRAVENOUS | Status: AC
  Administered 2017-09-19: 17:00:00 via INTRAVENOUS

## 2017-09-19 MED ORDER — FAMOTIDINE IN NACL 20-0.9 MG/50ML-% IV SOLN: 20.0000 mg | Freq: Two times a day (BID) | INTRAVENOUS | Status: DC

## 2017-09-19 MED ORDER — SUCRALFATE 1 GM/10ML PO SUSP
1.0000 g | Freq: Three times a day (TID) | ORAL | Status: DC
  Administered 2017-09-19 (×2): 1 g via ORAL
  Filled 2017-09-19 (×5): qty 10

## 2017-09-19 NOTE — Clinical Social Work Note (Signed)
Clinical Social Work Assessment  Patient Details  Name: [name] MRN: [id number] Date of Birth: 08-28-1925  Date of referral:  09/19/17               Reason for consult:  Facility Placement                Permission sought to share information with:    Permission granted to share information::     Name::        Agency::     Relationship::     Contact Information:  son, [name] and daughter in law were at bedside  Housing/Transportation Living arrangements for the past 2 months:  Single Family Home Source of Information:  Adult Children Patient Interpreter Needed:  None Criminal Activity/Legal Involvement Pertinent to Current Situation/Hospitalization:  No - Comment as needed Significant Relationships:  Adult Children Lives with:  Adult Children Do you feel safe going back to the place where you live?  Yes Need for family participation in patient care:  Yes (Comment)  Care giving concerns:  None identified at baseline.    Social Worker assessment / plan: At baseline, patient ambulates with a walker and ADLs are assisted by a CNA that comes to the home. Patient lives with son, [name] and daughter in Sports coach. Family is agreeable to short term rehab at [facility].   Employment status:  Retired Forensic scientist:  Medicare PT Recommendations:  [place] / Referral to community resources:  [place]  Patient/Family's Response to care:  Family is agreeable to rehab at [facility].   Patient/Family's Understanding of and Emotional Response to Diagnosis, Current Treatment, and Prognosis:  Family has understanding of patient's diagnosis, treatment and prognosis, and believe that skilled nursing for short term rehab is most appropriate discharge plan.   Emotional Assessment Appearance:  Appears stated age Attitude/Demeanor/Rapport:  Unable to Assess Affect (typically observed):  Unable to Assess Orientation:  Oriented to Self Alcohol / Substance  use:  Not Applicable Psych involvement (Current and /or in the community):  No (Comment)  Discharge Needs  Concerns to be addressed:  Discharge Planning Concerns Readmission within the last 30 days:  No Current discharge risk:  None Barriers to Discharge:  No Barriers Identified   Ihor Gully, LCSW 09/19/2017, 4:49 PM

## 2017-09-19 NOTE — Progress Notes (Addendum)
PROGRESS NOTE    [name]  BOF:[id number] DOB: 1925/09/09 DOA: 09/16/2017 PCP: Chipper Herb, MD    Brief Narrative:  84 year old female with medical history significant for hypertension, dyslipidemia, hypothyroidism, low back pain with neuropathy and a recent treatment with Augmentin for UTI who was brought to the ED via EMS due to ongoing weakness and altered mental status changes.  There is concern for sepsis in the setting of partially treated UTI.   Patient with acute blood loss anemia in the setting of upper GIB; become unresponsive and with soft BP. Hgb down to 6.3. Long discussion with family and meeting with palliative care. Plan is for non invasive treatment. She is DNR/DNI.  Assessment & Plan: 1-sepsis due to UTI: Patient met sepsis criteria on admission with elevated WBCs, abnormal urinalysis suggesting source of infection, low blood pressure and elevated heart rate. -patient initially treated with IV cipro -mentation still very off, and given concerns for fluoroquinolones and AMS in elderly, abx's switched to Bactrim. -patient refuse meds and now unresponsive after acute hypovolemic event with upper GIB -no fever -no dysuria -will hold on further antibiotic therapy for now.  2-acute blood loss anemia and hypovolemia -IVF's and blood transfusion given -follow Hgb trend  -started on IV PPI -patient with prior hx of peptic ulcer disease and has been actively using NSAID's prior to admission -lovenox discontinued -CLD ordered -family decline EGD  3-HTN -BP soft -will hold antihypertensive therapy -continue IVF's   4-hypothyroidism -continue synthroid -TSH and T4  low, dose adjusted to 123mg -repeat thyroid panel in 6 weeks -for now will give IV.  5-toxic encephalopathy: in the setting of underlying dementia -continue supportive care -minimize sedatives meds -will resume seroquel when able to take PO's again.  5-neuropathy -chronic -continue  neurontin when tolerating PO's  6-dyslipidemia -will discontinue lipitor  -patient no taking PO's and will try to minimize pill burden   7-weakness/deconditioning  -will need SNF for rehab -SW aware. -will follow clinical response; depending progression might be a candidate for residential hospice.    DVT prophylaxis: SCD's Code Status: DNR/DNI Family Communication: Son at bedside. Disposition Plan: patient now DNR/DNI; will provide blood transfusion and IVF's; no more lovenox; started on PPI. Patient's family declining EGD. Main focus is keeping her comfortable.  Consultants:   None  Procedures:   See below for x-ray reports.  Antimicrobials:  Anti-infectives (From admission, onward)   Start     Dose/Rate Route Frequency Ordered Stop   09/18/17 2100  ciprofloxacin (CIPRO) IVPB 400 mg  Status:  Discontinued     400 mg 200 mL/hr over 60 Minutes Intravenous Every 24 hours 09/18/17 0950 09/18/17 1103   09/18/17 1200  sulfamethoxazole-trimethoprim (BACTRIM DS,SEPTRA DS) 800-160 MG per tablet 0.5 tablet  Status:  Discontinued     0.5 tablet Oral Every 12 hours 09/18/17 1103 09/19/17 0933   09/17/17 2000  ciprofloxacin (CIPRO) IVPB 400 mg  Status:  Discontinued     400 mg 200 mL/hr over 60 Minutes Intravenous Every 18 hours 09/17/17 1356 09/18/17 0950   09/17/17 1600  ciprofloxacin (CIPRO) IVPB 400 mg  Status:  Discontinued     400 mg 200 mL/hr over 60 Minutes Intravenous Every 12 hours 09/17/17 0402 09/17/17 1356   09/17/17 0400  ciprofloxacin (CIPRO) IVPB 400 mg     400 mg 200 mL/hr over 60 Minutes Intravenous  Once 09/17/17 0358 09/17/17 0509       Subjective: No fever, unresponsive in the setting of hypovolemia  and acute blood loss. Having some abd discomfort on palpation. Positive hematemesis and melena.  Objective: Vitals:   09/19/17 0904 09/19/17 0906 09/19/17 0910 09/19/17 1500  BP: 93/80 (!) 83/36 (!) 75/32 (!) 63/41  Pulse: (!) 106 98 (!) 103 99  Resp:    18    Temp:    97.9 F (36.6 C)  TempSrc:    Oral  SpO2: 100% 99% 97% 91%  Weight:      Height:        Intake/Output Summary (Last 24 hours) at 09/19/2017 1618 Last data filed at 09/19/2017 1400 Gross per 24 hour  Intake 2023.33 ml  Output 1100 ml  Net 923.33 ml   Filed Weights   09/16/17 2305 09/17/17 0454  Weight: 68 kg (150 lb) 70.8 kg (156 lb 1.4 oz)    Examination: General exam: Afebrile, unresponsive and not following commands. Protecting airways; no CP. Respiratory system: Clear to auscultation. Respiratory effort normal. Cardiovascular system:tachycardic, positive SEM, no rubs, no gallops. Gastrointestinal system: Abdomen is  nondistended, soft and tender to palpation; positive BS.  Central nervous system: moving four limbs spontaneously; no following commands.  Extremities: No C/C/E, +pedal pulses Skin: No rashes, lesions or ulcers Psychiatry: poor insight appreciated on 7/10; currently not following commands and unable to assess mood.   Data Reviewed: I have personally reviewed following labs and imaging studies  CBC: Recent Labs  Lab 09/16/17 2314 09/19/17 1058  WBC 25.0* 41.8*  NEUTROABS  --  37.2*  HGB 9.0* 6.3*  HCT 29.5* 20.3*  MCV 94.6 96.2  PLT 185 161*   Basic Metabolic Panel: Recent Labs  Lab 09/16/17 2314  NA 136  K 3.9  CL 103  CO2 24  GLUCOSE 138*  BUN 29*  CREATININE 1.77*  CALCIUM 9.2   GFR: Estimated Creatinine Clearance: 20.8 mL/min (A) (by C-G formula based on SCr of 1.77 mg/dL (H)).   Liver Function Tests: Recent Labs  Lab 09/16/17 2314  AST 22  ALT 13  ALKPHOS 92  BILITOT 0.7  PROT 6.5  ALBUMIN 2.4*   CBG: Recent Labs  Lab 09/16/17 2303  GLUCAP 132*   Thyroid Function Tests: Recent Labs    09/17/17 1059  TSH 0.212*   Urine analysis:    Component Value Date/Time   COLORURINE YELLOW 09/16/2017 2320   APPEARANCEUR HAZY (A) 09/16/2017 2320   APPEARANCEUR Clear 08/28/2017 1048   LABSPEC 1.013 09/16/2017 2320    PHURINE 7.0 09/16/2017 2320   GLUCOSEU NEGATIVE 09/16/2017 2320   HGBUR NEGATIVE 09/16/2017 2320   BILIRUBINUR NEGATIVE 09/16/2017 2320   BILIRUBINUR Negative 08/28/2017 1048   KETONESUR NEGATIVE 09/16/2017 2320   PROTEINUR NEGATIVE 09/16/2017 2320   UROBILINOGEN negative 05/22/2013 1249   UROBILINOGEN 0.2 12/28/2006 0933   NITRITE NEGATIVE 09/16/2017 2320   LEUKOCYTESUR SMALL (A) 09/16/2017 2320   LEUKOCYTESUR Negative 08/28/2017 1048    Recent Results (from the past 240 hour(s))  Culture, blood (Routine X 2) w Reflex to ID Panel     Status: None (Preliminary result)   Collection Time: 09/16/17 11:14 PM  Result Value Ref Range Status   Specimen Description BLOOD RIGHT WRIST DRAWN BY RN  Final   Special Requests   Final    BOTTLES DRAWN AEROBIC AND ANAEROBIC Blood Culture adequate volume   Culture   Final    NO GROWTH 3 DAYS Performed at [facility], [address], [place], [place] [address]    Report Status PENDING  Incomplete  Culture, blood (Routine  X 2) w Reflex to ID Panel     Status: None (Preliminary result)   Collection Time: 09/16/17 11:43 PM  Result Value Ref Range Status   Specimen Description BLOOD LEFT WRIST  Final   Special Requests   Final    BOTTLES DRAWN AEROBIC AND ANAEROBIC Blood Culture adequate volume   Culture   Final    NO GROWTH 3 DAYS Performed at [facility], [address], [place], [place] [address]    Report Status PENDING  Incomplete     Radiology Studies: No results found.  Scheduled Meds: . sodium chloride   Intravenous Once  . fluticasone  2 spray Each Nare Daily  . levothyroxine  50 mcg Intravenous Daily  . mouth rinse  15 mL Mouth Rinse BID  . sucralfate  1 g Oral TID WC & HS   Continuous Infusions: . sodium chloride 75 mL/hr at 09/19/17 0957  . pantoprozole (PROTONIX) infusion 8 mg/hr (09/19/17 0952)     LOS: 2 days    Time spent: 35 minutes; more than 50% of time dedicated to face to face examination, discussion of  goals of care, care coordination and family updates. Time also spent discussing with other subspecialty providers.    Barton Dubois, MD [facility] Pager [phone]  If 7PM-7AM, please contact night-coverage [email or web address] Password [facility] 09/19/2017, 4:18 PM

## 2017-09-19 NOTE — Plan of Care (Signed)
During bedside shift report patient noted to be coughing up bright red blood and complaining of abdominal pain, notified Dr. Dyann Kief who stated he would assess the patient when he gets to the floor, no new orders received.  After cleaning up patient from coughing up blood, patient then had a stool incontinent episode, stool was dark black, coffee ground. Will continue to monitor patient.

## 2017-09-19 NOTE — Consult Note (Signed)
Consultation Note Date: 09/19/2017   Patient Name: [name]  DOB: 05/03/1925  MRN: [id number]  Age / Sex: 84 y.o., female  PCP: Chipper Herb, MD Referring Physician: Barton Dubois, MD  Reason for Consultation: Establishing goals of care, Hospice Evaluation, Non pain symptom management, Pain control and Psychosocial/spiritual support  HPI/Patient Profile: 84 y.o. female  with past medical history of GIB, ITP, PUD, esophageal stricture, low back pain and chronic UTI who was admitted on 09/16/2017 with altered mental status and weakness.  She was being treated for a UTI outpatient with Cipro.  The patient's WBC was 25 and her creatinine was elevated.   Clinical Assessment and Goals of Care:  I have reviewed medical records including EPIC notes, labs and imaging, received report from the care team, assessed the patient and then met at the bedside along with her sons Konrad Dolores, [name] (and Legrand Como on the phone), as well as her grand daughter Marita [place] and daughter in law Malachy Mood to discuss diagnosis prognosis, [place], EOL wishes, disposition and options.  I introduced Palliative Medicine as specialized medical care for people living with serious illness. It focuses on providing relief from the symptoms and stress of a serious illness. The goal is to improve quality of life for both the patient and the family.  We discussed a brief life review of the patient. 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  She was married to a IT trainer and raised 3 boys who are all Paediatric nurse.  Her husband of 59+ years died just a few years ago.  Since then she has become some what depressed.  She lives with her son Konrad Dolores.  She was able to stand with her walker but lived a bed to sofa existence.  She has slept most of the time since her husband died.  Tommy states her appetite has been poor.   She has also suffered with a lot of back pain for  which he gives her MOTRIN daily.  We discussed her current illness and what it means in the larger context of her on-going co-morbidities.  Natural disease trajectory and expectations at EOL were discussed.  Specifically Mrs. Bruyere was severely septic when she was admitted.  After 3 days she developed hematemesis and melana and dropped her hgb.  Her blood pressure has been too soft to allow for a GI evaluation.  It has also been too soft to allow for pain medications.  I attempted to elicit values and goals of care important to the patient.  Her 3 sons are in agreement that they want to treat the treatable.  In other words they are in support of treating her empirically for an ulcer (protonix gtt) and giving blood if she needs it.  However they do no want her to be in pain.  The patient states "it feels like they are burning a hole thru my stomach".  Per Yates Decamp, and Tommy relieving her pain takes priority over maintaining her blood pressure - even if it shortens her  life.  Hospice and Palliative Care services outpatient were discussed at a high level.  PMT will evaluate Mrs. Tellado tomorrow and attempt to determine whether Hospice services at home with [facility] vs Hospice services in a facility may be more appropriate.   Primary Decision Maker:  NEXT OF KIN 3 sons - Konrad Dolores, [name] and Legrand Como    SUMMARY OF RECOMMENDATIONS     Agree with protonix gtt and blood transfusion if appropriate.  Will add sucralfate to attempt to relieve her burning.  No further NSAIDS - patient was on MOTRIN at home.    Would recommend low dose liquid roxycodone if she discharges to home.  Family is in agreement that they do not want her in pain.  Treat her pain regardless of her blood pressure.  Clear liquid diet with aspiration precautions.  Reassess 7/12 - for potential hospice recommendations  Code Status/Advance Care Planning:  DNR   Palliative Prophylaxis:   Aspiration, Delirium Protocol and  Frequent Pain Assessment  Psycho-social/Spiritual:   Desire for further Chaplaincy support:  Yes [employer]  Prognosis:  Poor in the setting of Sepsis, GI bleed, hypotension, on-going decline and advanced age.    Discharge Planning: To Be Determined      Primary Diagnoses: Present on Admission: . Altered mental status . Essential hypertension . Hyperlipidemia . Hypothyroidism . AKI (acute kidney injury) ([place])   I have reviewed the medical record, interviewed the patient and family, and examined the patient. The following aspects are pertinent.  Past Medical History:  Diagnosis Date  . Adhesion of abdominal wall    "continues to have abdominal pain pain intermittent right abdominal side. "scar tissue"  . Arthritis   . Breast cyst   . Bronchitis, asthmatic    'bronchitis that will go into an asthma like attack but haven't had it in years'  . Colon polyps   . Elevated blood sugar   . Esophageal stricture    "occ. problems with swallowing water"  . Full dentures   . GI bleed   . Goiter   . History of blood transfusion   . History of hiatal hernia    repaired over 20 years ago  . ITP (idiopathic thrombocytopenic purpura)    not a problem now.  . Other and unspecified hyperlipidemia   . Peptic ulcer disease   . Rhinitis   . Unspecified essential hypertension   . Unspecified hypothyroidism    Social History   Socioeconomic History  . Marital status: Married    Spouse name: Not on file  . Number of children: Not on file  . Years of education: Not on file  . Highest education level: Not on file  Occupational History  . Not on file  Social Needs  . Financial resource strain: Not on file  . Food insecurity:    Worry: Not on file    Inability: Not on file  . Transportation needs:    Medical: Not on file    Non-medical: Not on file  Tobacco Use  . Smoking status: Passive Smoke Exposure - Never Smoker  . Smokeless tobacco: Never Used  Substance and Sexual  Activity  . Alcohol use: No  . Drug use: No  . Sexual activity: Not Currently  Lifestyle  . Physical activity:    Days per week: Not on file    Minutes per session: Not on file  . Stress: Not on file  Relationships  . Social connections:    Talks on phone: Not on file  Gets together: Not on file    Attends religious service: Not on file    Active member of club or organization: Not on file    Attends meetings of clubs or organizations: Not on file    Relationship status: Not on file  Other Topics Concern  . Not on file  Social History Narrative   Lives at home with wife and son.    Family History  Problem Relation Age of Onset  . Lung cancer Father   . Heart disease Mother        Unkown.  Died age 76  . Emphysema Sister    Scheduled Meds: . atorvastatin  80 mg Oral Daily  . calcium-vitamin D  1 tablet Oral Daily  . cholecalciferol  2,000 Units Oral Daily  . DULoxetine  60 mg Oral q morning - 10a  . enoxaparin (LOVENOX) injection  30 mg Subcutaneous Q24H  . fluticasone  2 spray Each Nare Daily  . gabapentin  400 mg Oral Daily  . levothyroxine  100 mcg Oral QAC breakfast  . mouth rinse  15 mL Mouth Rinse BID  . potassium chloride  10 mEq Oral Daily  . QUEtiapine  50 mg Oral QHS  . sulfamethoxazole-trimethoprim  0.5 tablet Oral Q12H   Continuous Infusions: . sodium chloride 75 mL/hr at 09/18/17 2221  . famotidine (PEPCID) IV     PRN Meds:.acetaminophen **OR** acetaminophen, ondansetron **OR** [DISCONTINUED] ondansetron (ZOFRAN) IV, polyvinyl alcohol Allergies  Allergen Reactions  . Prednisone Other (See Comments)    Bloated. Unable to determine if intolerance with water retention, or Possible Allergic response with swelling.   Review of Systems "I feel like a hole is burning thru my stomach"  Physical Exam  Well developed elderly female, pale in color, awake, alert, confused. In fetal position. CV difficult to hear Resp no distress on 2 liters. Abdomen  soft  Vital Signs: BP 131/62 (BP Location: Left Arm)   Pulse 75   Temp 98.8 F (37.1 C) (Oral)   Resp 16   Ht 5' 5"  (1.651 m)   Wt 70.8 kg (156 lb 1.4 oz)   SpO2 99%   BMI 25.97 kg/m  Pain Scale: 0-10   Pain Score: 0-No pain   SpO2: SpO2: 99 % O2 Device:SpO2: 99 % O2 Flow Rate: .   IO: Intake/output summary:   Intake/Output Summary (Last 24 hours) at 09/19/2017 0824 Last data filed at 09/19/2017 9924 Gross per 24 hour  Intake 900 ml  Output 1100 ml  Net -200 ml    LBM: Last BM Date: 09/16/17 Baseline Weight: Weight: 68 kg (150 lb) Most recent weight: Weight: 70.8 kg (156 lb 1.4 oz)     Palliative Assessment/Data: 20%     Time In: 12:00 Time Out: 1:27 Time Total: 87 min Greater than 50%  of this time was spent counseling and coordinating care related to the above assessment and plan.  Signed by: Florentina Jenny, PA-C Palliative Medicine Pager: [phone]  Please contact Palliative Medicine Team phone at [phone] for questions and concerns.  For individual provider: See Shea Evans

## 2017-09-19 NOTE — NC FL2 (Signed)
[place] LEVEL OF CARE SCREENING TOOL     IDENTIFICATION  Patient Name: [name] Birthdate: 05/14/1925 Sex: female Admission Date (Current Location): 09/16/2017  [facility] and [place] Number:  [employer] and Address:  [place] [address], [place]      Provider Number: [id number]  Attending Physician Name and Address:  Barton Dubois, MD  Relative Name and Phone Number:       Current Level of Care: Hospital Recommended Level of Care: [place] Prior Approval Number:    Date Approved/Denied:   PASRR Number: [id number]  Discharge Plan: SNF    Current Diagnoses: Patient Active Problem List   Diagnosis Date Noted  . Altered mental status 09/17/2017  . Confusion and disorientation 02/01/2017  . UTI (urinary tract infection) 02/01/2017  . AKI (acute kidney injury) ([place]) 02/01/2017  . Lumbar radiculopathy 03/14/2016  . HNP (herniated nucleus pulposus), cervical 05/16/2015    Class: Chronic  . Herniated nucleus pulposus, lumbar 05/16/2015  . Abnormal EKG 04/21/2015  . Thrombocytopenia ([place]) 08/18/2014  . Insomnia 11/24/2013  . Impingement syndrome of left shoulder 09/18/2013  . S/P arthroscopy of shoulder 09/18/2013  . Low back pain 03/17/2013  . Neuropathy 03/17/2013  . Metabolic syndrome 01/03/8526  . Overweight (BMI 25.0-29.9) 03/17/2013  . Vitamin D deficiency 12/01/2012  . Chronic insomnia 12/01/2012  . Fibrocystic breast changes 05/21/2011  . Essential hypertension   . Hyperlipidemia   . Peptic ulcer disease   . ITP (idiopathic thrombocytopenic purpura)   . Rhinitis   . Elevated blood sugar   . Hypothyroidism   . Goiter   . Colon polyps   . Adhesion of abdominal wall   . Esophageal stricture     Orientation RESPIRATION BLADDER Height & Weight     Self  Normal Incontinent Weight: 156 lb 1.4 oz (70.8 kg) Height:  5\' 5"  (165.1 cm)  BEHAVIORAL SYMPTOMS/MOOD NEUROLOGICAL  BOWEL NUTRITION STATUS      Incontinent Diet(see discharge summary)  AMBULATORY STATUS COMMUNICATION OF NEEDS Skin   Extensive Assist Verbally Normal                       Personal Care Assistance Level of Assistance  Bathing, Feeding, Dressing Bathing Assistance: Maximum assistance Feeding assistance: Limited assistance Dressing Assistance: Maximum assistance     Functional Limitations Info  Sight, Hearing, Speech Sight Info: Adequate Hearing Info: Adequate Speech Info: Adequate    SPECIAL CARE FACTORS FREQUENCY  PT (By licensed PT)     PT Frequency: 5x/week              Contractures Contractures Info: Not present    Additional Factors Info  Code Status, Allergies Code Status Info: DNR Allergies Info: Prednisone           Current Medications (09/19/2017):  This is the current hospital active medication list Current Facility-Administered Medications  Medication Dose Route Frequency Provider Last Rate Last Dose  . 0.9 %  sodium chloride infusion   Intravenous Continuous Heath Lark D, DO 75 mL/hr at 09/19/17 0957    . acetaminophen (TYLENOL) tablet 650 mg  650 mg Oral Q6H PRN Manuella Ghazi, Pratik D, DO       Or  . acetaminophen (TYLENOL) suppository 650 mg  650 mg Rectal Q6H PRN Manuella Ghazi, Pratik D, DO      . fluticasone (FLONASE) 50 MCG/ACT nasal spray 2 spray  2 spray Each Nare Daily Manuella Ghazi, Pratik  D, DO   2 spray at 09/19/17 0952  . levothyroxine (SYNTHROID, LEVOTHROID) injection 50 mcg  50 mcg Intravenous Daily Barton Dubois, MD      . MEDLINE mouth rinse  15 mL Mouth Rinse BID Manuella Ghazi, Pratik D, DO   15 mL at 09/19/17 0952  . morphine 2 MG/ML injection 1-3 mg  1-3 mg Intravenous Q4H PRN Dellinger, Marianne L, PA-C   2 mg at 09/19/17 1338  . ondansetron (ZOFRAN) tablet 4 mg  4 mg Oral Q6H PRN Manuella Ghazi, Pratik D, DO      . pantoprazole (PROTONIX) 80 mg in sodium chloride 0.9 % 250 mL (0.32 mg/mL) infusion  8 mg/hr Intravenous Continuous Barton Dubois, MD 25 mL/hr at 09/19/17  0952 8 mg/hr at 09/19/17 0952  . sucralfate (CARAFATE) 1 GM/10ML suspension 1 g  1 g Oral TID WC & HS Dellinger, Marianne L, PA-C   1 g at 09/19/17 1633     Discharge Medications: Please see discharge summary for a list of discharge medications.  Relevant Imaging Results:  Relevant Lab Results:   Additional Information SSN [id number], Clydene Pugh, LCSW

## 2017-09-19 NOTE — Progress Notes (Signed)
Patient noted to be very lethargic, BP 80s/30s, continues to have dark colored stools, vomiting up bright red blood, notified Dr. Dyann Kief who is coming to see patient. Patient's son Konrad Dolores called to come to hospital to speak with Dr. Dyann Kief about making patient a DNR

## 2017-09-19 NOTE — Progress Notes (Signed)
CRITICAL VALUE ALERT  Critical Value: Hgb 6.3   Date & Time Notied:  09/19/2017 1151  Provider Notified: Dr. Dyann Kief  Orders Received/Actions taken: awaiting call back/ orders

## 2017-09-20 ENCOUNTER — Inpatient Hospital Stay (HOSPITAL_COMMUNITY): Payer: Medicare Other

## 2017-09-20 DIAGNOSIS — K254 Chronic or unspecified gastric ulcer with hemorrhage: Secondary | ICD-10-CM

## 2017-09-20 DIAGNOSIS — K921 Melena: Secondary | ICD-10-CM

## 2017-09-20 DIAGNOSIS — Z515 Encounter for palliative care: Secondary | ICD-10-CM

## 2017-09-20 LAB — TYPE AND SCREEN
ABO/RH(D): O POS
ANTIBODY SCREEN: NEGATIVE
UNIT DIVISION: 0
Unit division: 0

## 2017-09-20 LAB — CBC
HCT: 27 % — ABNORMAL LOW (ref 36.0–46.0)
HEMOGLOBIN: 8.8 g/dL — AB (ref 12.0–15.0)
MCH: 30.6 pg (ref 26.0–34.0)
MCHC: 32.6 g/dL (ref 30.0–36.0)
MCV: 93.8 fL (ref 78.0–100.0)
PLATELETS: 110 10*3/uL — AB (ref 150–400)
RBC: 2.88 MIL/uL — AB (ref 3.87–5.11)
RDW: 13.6 % (ref 11.5–15.5)
WBC: 34.2 10*3/uL — ABNORMAL HIGH (ref 4.0–10.5)

## 2017-09-20 LAB — BPAM RBC
Blood Product Expiration Date: 201908112359
Blood Product Expiration Date: 201908112359
ISSUE DATE / TIME: 201907111938
ISSUE DATE / TIME: 201907111650
Unit Type and Rh: 5100
Unit Type and Rh: 5100

## 2017-09-20 MED ORDER — SENNA 8.6 MG PO TABS
1.0000 | ORAL_TABLET | Freq: Every day | ORAL | Status: DC
  Filled 2017-09-20: qty 1

## 2017-09-20 MED ORDER — MORPHINE SULFATE (PF) 2 MG/ML IV SOLN
1.0000 mg | Freq: Four times a day (QID) | INTRAVENOUS | Status: DC | PRN
  Administered 2017-09-20 – 2017-09-23 (×5): 1 mg via INTRAVENOUS
  Filled 2017-09-20 (×6): qty 1

## 2017-09-20 MED ORDER — MORPHINE SULFATE (CONCENTRATE) 10 MG/0.5ML PO SOLN
2.5000 mg | ORAL | Status: DC | PRN
  Filled 2017-09-20: qty 0.5

## 2017-09-20 MED ORDER — SIMETHICONE 40 MG/0.6ML PO SUSP: 40.0000 mg | Freq: Four times a day (QID) | ORAL | Status: DC | PRN

## 2017-09-20 NOTE — Plan of Care (Signed)
Patient confused and noncompliant. Will continue to monitor.

## 2017-09-20 NOTE — Progress Notes (Addendum)
Daily Progress Note   Patient Name: [name]       Date: 09/20/2017 DOB: Aug 20, 1925  Age: 84 y.o. MRN#: [id number] Attending Physician: Barton Dubois, MD Primary Care Physician: Chipper Herb, MD Admit Date: 09/16/2017  Reason for Consultation/Follow-up: Establishing goals of care, Non pain symptom management, Pain control and Psychosocial/spiritual support  Subjective: Patient much more alert today.  No complaints of pain.  Family at bedside very pleased.  Hopeful that she will one day (after SNF) be able to return home.   They report she is tolerating sips of clears but does not like cold food because she is always cold.  Discussed - "no further NSAIDs" with son and daughter in law at bedside.  Patient will need low dose opioid and bowel regimen for pain control.   Son, [name], asks "do you think we can rip off this DNR bracelet and get her home again?"   All the sons are 1st Responders - so we talked about doing CPR to a 84 year old.  Danny admitted that would not be a good idea.  I attempted to explain that the DNR bracelet only comes into play if she arrests and that it is a protective measure.  Family is hopeful for SNF rehab at [facility].  (Patient's husband went there years ago)   Assessment: No further signs of bleeding - no bowel movements or hematemesis overnight.  BP stabilized.  Patient appears improved.  At continued risk for recurrent UTI and GIB.  WBC count significantly elevated at 34.2 - family says it is normally high (around 15). Appropriate rise in Hgb to 8.8 after transfusion of 2 units of PRBCs.   Patient Profile/HPI:  84 y.o. female  with past medical history of GIB, ITP, PUD, esophageal stricture, low back pain and chronic UTI who was admitted on 09/16/2017  with altered mental status and weakness.  She was being treated for a UTI outpatient with Cipro.  The patient's WBC was 25 and her creatinine was elevated.  She developed GIB on 7/11.  Family provided history that she was taking 6 motrin per day at home for back pain.  Length of Stay: 3  Current Medications: Scheduled Meds:  . fluticasone  2 spray Each Nare Daily  . levothyroxine  50 mcg Intravenous Daily  .  mouth rinse  15 mL Mouth Rinse BID  . sucralfate  1 g Oral TID WC & HS    Continuous Infusions: . sodium chloride 75 mL/hr at 09/20/17 0727  . pantoprozole (PROTONIX) infusion 8 mg/hr (09/20/17 0235)    PRN Meds: acetaminophen **OR** acetaminophen, morphine injection, ondansetron **OR** [DISCONTINUED] ondansetron (ZOFRAN) IV  Physical Exam        Well developed elderly very pleasant female.  Cooperative.  Trying to get up. CV rrr Resp no distress Abdomen soft, nt, some distention in the upper quadrants (patient states this is normal) Ext able to move all 4.  Vital Signs: BP (!) 122/58   Pulse 96   Temp 98.8 F (37.1 C) (Oral)   Resp 16   Ht 5\' 5"  (1.651 m)   Wt 70.8 kg (156 lb 1.4 oz)   SpO2 100%   BMI 25.97 kg/m  SpO2: SpO2: 100 % O2 Device: O2 Device: Room Air O2 Flow Rate:    Intake/output summary:   Intake/Output Summary (Last 24 hours) at 09/20/2017 0959 Last data filed at 09/20/2017 0500 Gross per 24 hour  Intake 4476.83 ml  Output -  Net 4476.83 ml   LBM: Last BM Date: 09/19/17 Baseline Weight: Weight: 68 kg (150 lb) Most recent weight: Weight: 70.8 kg (156 lb 1.4 oz)       Palliative Assessment/Data:  30%      Patient Active Problem List   Diagnosis Date Noted  . Altered mental status 09/17/2017  . Confusion and disorientation 02/01/2017  . UTI (urinary tract infection) 02/01/2017  . AKI (acute kidney injury) ([place]) 02/01/2017  . Lumbar radiculopathy 03/14/2016  . HNP (herniated nucleus pulposus), cervical 05/16/2015    Class: Chronic  .  Herniated nucleus pulposus, lumbar 05/16/2015  . Abnormal EKG 04/21/2015  . Thrombocytopenia ([place]) 08/18/2014  . Insomnia 11/24/2013  . Impingement syndrome of left shoulder 09/18/2013  . S/P arthroscopy of shoulder 09/18/2013  . Low back pain 03/17/2013  . Neuropathy 03/17/2013  . Metabolic syndrome 02/72/5366  . Overweight (BMI 25.0-29.9) 03/17/2013  . Vitamin D deficiency 12/01/2012  . Chronic insomnia 12/01/2012  . Fibrocystic breast changes 05/21/2011  . Essential hypertension   . Hyperlipidemia   . Peptic ulcer disease   . ITP (idiopathic thrombocytopenic purpura)   . Rhinitis   . Elevated blood sugar   . Hypothyroidism   . Goiter   . Colon polyps   . Adhesion of abdominal wall   . Esophageal stricture     Palliative Care Plan    Recommendations/Plan:  No further nsaids.  Will start low dose roxanol for pain and senna qhs.  On going PPI therapy.  Up to chair daily   Will advance diet to full liquids (patient hopeful for grits)  Family plans for patient to d/c to SNF (hopefully [facility]).  Then home one day if she continues to improve.  Palliative to follow at SNF please  PMT will sign off inpatient.  Please call us back if patient declines or we can be of assistant.  Goals of Care and Additional Recommendations:  Limitations on Scope of Treatment: No Artificial Feeding and No Surgical Procedures  Code Status:  DNR  Prognosis:   < 12 months in the setting of recurrent infection, limited mobility, advanced age, frailty.   Discharge Planning:  [facility] for rehab with Palliative care service follow-up  Care plan was discussed with son [name], DIL at bedside.  Thank you for allowing the Palliative Medicine Team  to assist in the care of this patient.  Total time spent:  35 min.     Greater than 50%  of this time was spent counseling and coordinating care related to the above assessment and plan.  Florentina Jenny,  PA-C Palliative Medicine  Please contact Palliative MedicineTeam phone at [phone] for questions and concerns between 7 am - 7 pm.   Please see AMION for individual provider pager numbers.

## 2017-09-20 NOTE — Progress Notes (Signed)
PROGRESS NOTE    [name]  FMB:[id number] DOB: November 26, 1925 DOA: 09/16/2017 PCP: Chipper Herb, MD    Brief Narrative:  84 year old female with medical history significant for hypertension, dyslipidemia, hypothyroidism, low back pain with neuropathy and a recent treatment with Augmentin for UTI who was brought to the ED via EMS due to ongoing weakness and altered mental status changes.  There is concern for sepsis in the setting of partially treated UTI.   Patient with acute blood loss anemia in the setting of upper GIB; become unresponsive and with soft BP. Hgb down to 6.3. Long discussion with family and meeting with palliative care. Plan is for non invasive treatment. She is DNR/DNI.  Assessment & Plan: 1-sepsis due to UTI: Patient met sepsis criteria on admission with elevated WBCs, abnormal urinalysis suggesting source of infection, low blood pressure and elevated heart rate. -patient initially treated with IV cipro -mentation still very off, and given concerns for fluoroquinolones and AMS in elderly, abx's switched to Bactrim. -patient refusing PO meds and remains afebrile and denying dysuria. -will hold on antibiotics -main goal is comfort.   2-acute blood loss anemia and hypovolemia -IVF's and blood transfusion given; Hgb 8.8 after transfusion. -follow Hgb trend  -still having some blood in her stools. -continue IV PPI -patient with prior hx of peptic ulcer disease and has been actively using NSAID's prior to admission. Most likely experiencing upper GIB. -lovenox discontinued -continue CLD  -family decline EGD  3-HTN -BP soft -will hold antihypertensive therapy -continue IVF's   4-hypothyroidism -continue synthroid -TSH and T4  low, dose adjusted to 15mg -repeat thyroid panel in 6 weeks -continue IV for now.  5-toxic encephalopathy: in the setting of underlying dementia -continue supportive care -minimize sedatives meds -will resume seroquel when able to  take PO's again.  5-neuropathy -chronic -continue neurontin eventually if able to tolerate PO's and willing to take medication.  6-dyslipidemia -will discontinue lipitor  -patient no taking PO's and will try to minimize pill burden  -she continue refusing meds.  7-weakness/deconditioning  -will need SNF for rehab -SW aware. -will follow clinical response; depending progression might be a candidate for residential hospice.    DVT prophylaxis: SCD's Code Status: DNR/DNI Family Communication: Son at bedside. Disposition Plan: patient is alert, awake and pleasantly confuse. Good response to PRBC transfusion. Will check CXR, assess patient stability prior to discharge in the next 24-48 hours.  Consultants:   None  Procedures:   See below for x-ray reports.  Antimicrobials:  Anti-infectives (From admission, onward)   Start     Dose/Rate Route Frequency Ordered Stop   09/18/17 2100  ciprofloxacin (CIPRO) IVPB 400 mg  Status:  Discontinued     400 mg 200 mL/hr over 60 Minutes Intravenous Every 24 hours 09/18/17 0950 09/18/17 1103   09/18/17 1200  sulfamethoxazole-trimethoprim (BACTRIM DS,SEPTRA DS) 800-160 MG per tablet 0.5 tablet  Status:  Discontinued     0.5 tablet Oral Every 12 hours 09/18/17 1103 09/19/17 0933   09/17/17 2000  ciprofloxacin (CIPRO) IVPB 400 mg  Status:  Discontinued     400 mg 200 mL/hr over 60 Minutes Intravenous Every 18 hours 09/17/17 1356 09/18/17 0950   09/17/17 1600  ciprofloxacin (CIPRO) IVPB 400 mg  Status:  Discontinued     400 mg 200 mL/hr over 60 Minutes Intravenous Every 12 hours 09/17/17 0402 09/17/17 1356   09/17/17 0400  ciprofloxacin (CIPRO) IVPB 400 mg     400 mg 200 mL/hr over 60 Minutes  Intravenous  Once 09/17/17 0358 09/17/17 0509       Subjective: Afebrile, following commands, alert and pleasantly confused. Complaining of abd pain.  Objective: Vitals:   09/20/17 0521 09/20/17 0839 09/20/17 1523 09/20/17 1942  BP: (!) 129/55  (!) 122/58 (!) 137/59   Pulse: 79 96 90   Resp:  18    Temp: 98.9 F (37.2 C) 98.8 F (37.1 C) 97.9 F (36.6 C)   TempSrc: Oral Oral Axillary   SpO2: 100% 100% 96% 95%  Weight:      Height:        Intake/Output Summary (Last 24 hours) at 09/20/2017 2102 Last data filed at 09/20/2017 1724 Gross per 24 hour  Intake 2017.75 ml  Output 900 ml  Net 1117.75 ml   Filed Weights   09/16/17 2305 09/17/17 0454  Weight: 68 kg (150 lb) 70.8 kg (156 lb 1.4 oz)    Examination: General exam: Alert, awake, pleasantly confused. Able to follow commands, complaining of mid and RUQ abd pain. No fever. Respiratory system: Clear to auscultation. Respiratory effort normal. Cardiovascular system:RRR. SEM appreciated, no rubs, no gallops. Gastrointestinal system: Abdomen is tender to palpation on mid epigastric and RUQ; mild distension, no guarding, no rebound. Decrease but present BS.  Central nervous system: Alert and pleasantly confused.. No focal neurological deficits. Moving four limbs spontaneously.  Extremities: No C/C/E, +pedal pulses Skin: No rashes, lesions or ulcers   Data Reviewed: I have personally reviewed following labs and imaging studies  CBC: Recent Labs  Lab 09/16/17 2314 09/19/17 1058 09/20/17 0449  WBC 25.0* 41.8* 34.2*  NEUTROABS  --  37.2*  --   HGB 9.0* 6.3* 8.8*  HCT 29.5* 20.3* 27.0*  MCV 94.6 96.2 93.8  PLT 185 140* 413*   Basic Metabolic Panel: Recent Labs  Lab 09/16/17 2314  NA 136  K 3.9  CL 103  CO2 24  GLUCOSE 138*  BUN 29*  CREATININE 1.77*  CALCIUM 9.2   GFR: Estimated Creatinine Clearance: 20.8 mL/min (A) (by C-G formula based on SCr of 1.77 mg/dL (H)).   Liver Function Tests: Recent Labs  Lab 09/16/17 2314  AST 22  ALT 13  ALKPHOS 92  BILITOT 0.7  PROT 6.5  ALBUMIN 2.4*   CBG: Recent Labs  Lab 09/16/17 2303  GLUCAP 132*   Urine analysis:    Component Value Date/Time   COLORURINE YELLOW 09/16/2017 2320   APPEARANCEUR HAZY  (A) 09/16/2017 2320   APPEARANCEUR Clear 08/28/2017 1048   LABSPEC 1.013 09/16/2017 2320   PHURINE 7.0 09/16/2017 2320   GLUCOSEU NEGATIVE 09/16/2017 2320   HGBUR NEGATIVE 09/16/2017 2320   BILIRUBINUR NEGATIVE 09/16/2017 2320   BILIRUBINUR Negative 08/28/2017 1048   KETONESUR NEGATIVE 09/16/2017 2320   PROTEINUR NEGATIVE 09/16/2017 2320   UROBILINOGEN negative 05/22/2013 1249   UROBILINOGEN 0.2 12/28/2006 0933   NITRITE NEGATIVE 09/16/2017 2320   LEUKOCYTESUR SMALL (A) 09/16/2017 2320   LEUKOCYTESUR Negative 08/28/2017 1048    Recent Results (from the past 240 hour(s))  Culture, blood (Routine X 2) w Reflex to ID Panel     Status: None (Preliminary result)   Collection Time: 09/16/17 11:14 PM  Result Value Ref Range Status   Specimen Description BLOOD RIGHT WRIST DRAWN BY RN  Final   Special Requests   Final    BOTTLES DRAWN AEROBIC AND ANAEROBIC Blood Culture adequate volume   Culture   Final    NO GROWTH 4 DAYS Performed at [facility], [address]  [place], [place] [address]    Report Status PENDING  Incomplete  Culture, blood (Routine X 2) w Reflex to ID Panel     Status: None (Preliminary result)   Collection Time: 09/16/17 11:43 PM  Result Value Ref Range Status   Specimen Description BLOOD LEFT WRIST  Final   Special Requests   Final    BOTTLES DRAWN AEROBIC AND ANAEROBIC Blood Culture adequate volume   Culture   Final    NO GROWTH 4 DAYS Performed at [facility], [address], [place], [place] [address]    Report Status PENDING  Incomplete    Radiology Studies: Dg Abd 2 Views  Result Date: 09/20/2017 CLINICAL DATA:  Left-sided abdominal pain. EXAM: ABDOMEN - 2 VIEW COMPARISON:  Abdominal x-ray dated November 20, 2006. FINDINGS: Normal bowel gas pattern. No pneumoperitoneum. Unchanged 7 mm calculus in the right kidney. The visualized lungs are clear. No acute osseous abnormality. Prior sacral insufficiency fracture cement augmentation. Degenerative changes  of the lumbar spine. IMPRESSION: 1. No acute abnormality. 2. Unchanged right nephrolithiasis. Electronically Signed   By: Titus Dubin M.D.   On: 09/20/2017 18:44    Scheduled Meds: . fluticasone  2 spray Each Nare Daily  . levothyroxine  50 mcg Intravenous Daily  . mouth rinse  15 mL Mouth Rinse BID  . senna  1 tablet Oral QHS  . sucralfate  1 g Oral TID WC & HS   Continuous Infusions: . sodium chloride 75 mL/hr at 09/20/17 0727  . pantoprozole (PROTONIX) infusion 8 mg/hr (09/20/17 1500)     LOS: 3 days    Time spent: 30 minutes.   Barton Dubois, MD [facility] Pager [phone]  If 7PM-7AM, please contact night-coverage [email or web address] Password [facility] 09/20/2017, 9:02 PM

## 2017-09-20 NOTE — Care Management Important Message (Signed)
Important Message  Patient Details  Name: CIARRAH RAE MRN: [id number] Date of Birth: Jun 14, 1925   Medicare Important Message Given:  Yes    Shelda Altes 09/20/2017, 12:15 PM

## 2017-09-21 LAB — CBC
HCT: 24.1 % — ABNORMAL LOW (ref 36.0–46.0)
HEMOGLOBIN: 7.8 g/dL — AB (ref 12.0–15.0)
MCH: 30.4 pg (ref 26.0–34.0)
MCHC: 32.4 g/dL (ref 30.0–36.0)
MCV: 93.8 fL (ref 78.0–100.0)
Platelets: 133 10*3/uL — ABNORMAL LOW (ref 150–400)
RBC: 2.57 MIL/uL — AB (ref 3.87–5.11)
RDW: 14.2 % (ref 11.5–15.5)
WBC: 37.7 10*3/uL — ABNORMAL HIGH (ref 4.0–10.5)

## 2017-09-21 MED ORDER — LORAZEPAM 2 MG/ML IJ SOLN
1.0000 mg | Freq: Four times a day (QID) | INTRAMUSCULAR | Status: DC | PRN
  Administered 2017-09-21 – 2017-09-23 (×7): 1 mg via INTRAVENOUS
  Filled 2017-09-21 (×7): qty 1

## 2017-09-21 NOTE — Progress Notes (Signed)
PROGRESS NOTE    [name]  TGG:[id number] DOB: December 26, 1925 DOA: 09/16/2017 PCP: Chipper Herb, MD    Brief Narrative:  84 year old female with medical history significant for hypertension, dyslipidemia, hypothyroidism, low back pain with neuropathy and a recent treatment with Augmentin for UTI who was brought to the ED via EMS due to ongoing weakness and altered mental status changes.  There is concern for sepsis in the setting of partially treated UTI.   Patient with acute blood loss anemia in the setting of upper GIB; become unresponsive and with soft BP. Hgb down to 6.3. Long discussion with family and meeting with palliative care. Plan is for non invasive treatment. She is DNR/DNI.  Assessment & Plan: 1-sepsis due to UTI: Patient met sepsis criteria on admission with elevated WBCs, abnormal urinalysis suggesting source of infection, low blood pressure and elevated heart rate. -patient initially treated with IV cipro -mentation still very off, and given concerns for fluoroquinolones and AMS in elderly, abx's switched to Bactrim. -patient refusing PO meds and without specific source of infection. -has remained afebrile -denies dysuria  -will hold on further antibiotics -main goal is keeping her comfortable' after long discussion with family today, they are entertaining transition to full comfort care and hospice.   2-acute blood loss anemia and hypovolemia -IVF's and blood transfusion given; Hgb 7.8 today. -follow Hgb trend  -still having some blood in her stools, but minimal. -continue IV PPI -patient with prior hx of peptic ulcer disease and has been actively using NSAID's prior to admission. Most likely experiencing upper GIB. -lovenox discontinued -will allow full liquid diet  -family declined EGD  3-HTN -BP soft -will hold antihypertensive therapy -continue IVF's   4-hypothyroidism -continue synthroid for now (given IV,as she is refusing oral meds) -TSH and T4   low, dose adjusted to 118mg -repeat thyroid panel in 6 weeks -continue IV for now.  5-toxic encephalopathy: in the setting of underlying dementia -continue supportive care -will resume seroquel when able to take PO's again. -low dose ativan for agitation ordered   5-neuropathy -chronic -will plan to continue Neurontin if she take it by mouth; currently refusing meds.   6-dyslipidemia -will discontinue lipitor  -minimizing pill burden  -she continue refusing meds. -transitioning care to full comfort   7-weakness/deconditioning  -patient with difficulty following commands and declining meds/services in house. Also not eating much. -further discussion with family has triggered desire to explore hospice home and focus on full comfort care -CM/hospice consult requested -will continue symptomatic management  -ativan and morphine ordered   DVT prophylaxis: SCD's Code Status: DNR/DNI Family Communication: Son at bedside. Disposition Plan: patient is alert, awake and pleasantly confuse. Good response to PRBC transfusion. Will check CXR, assess patient stability prior to discharge in the next 24-48 hours.  Consultants:   Palliative care  Hospice   Procedures:   See below for x-ray reports.  Antimicrobials:  Anti-infectives (From admission, onward)   Start     Dose/Rate Route Frequency Ordered Stop   09/18/17 2100  ciprofloxacin (CIPRO) IVPB 400 mg  Status:  Discontinued     400 mg 200 mL/hr over 60 Minutes Intravenous Every 24 hours 09/18/17 0950 09/18/17 1103   09/18/17 1200  sulfamethoxazole-trimethoprim (BACTRIM DS,SEPTRA DS) 800-160 MG per tablet 0.5 tablet  Status:  Discontinued     0.5 tablet Oral Every 12 hours 09/18/17 1103 09/19/17 0933   09/17/17 2000  ciprofloxacin (CIPRO) IVPB 400 mg  Status:  Discontinued     400  mg 200 mL/hr over 60 Minutes Intravenous Every 18 hours 09/17/17 1356 09/18/17 0950   09/17/17 1600  ciprofloxacin (CIPRO) IVPB 400 mg  Status:   Discontinued     400 mg 200 mL/hr over 60 Minutes Intravenous Every 12 hours 09/17/17 0402 09/17/17 1356   09/17/17 0400  ciprofloxacin (CIPRO) IVPB 400 mg     400 mg 200 mL/hr over 60 Minutes Intravenous  Once 09/17/17 0358 09/17/17 0509       Subjective: No fever, patient oriented to person and place (intermittently); agitated and with intermittent complaints of abd pain.   Objective: Vitals:   09/20/17 1942 09/20/17 2209 09/21/17 0615 09/21/17 1403  BP:  (!) 124/50 125/76 (!) 150/55  Pulse:  88 (!) 135 73  Resp:  15 14 18   Temp:  99.2 F (37.3 C) 98.6 F (37 C)   TempSrc:  Oral Oral   SpO2: 95% 98% 99% 100%  Weight:      Height:        Intake/Output Summary (Last 24 hours) at 09/21/2017 1821 Last data filed at 09/21/2017 1300 Gross per 24 hour  Intake 60 ml  Output 1700 ml  Net -1640 ml   Filed Weights   09/16/17 2305 09/17/17 0454  Weight: 68 kg (150 lb) 70.8 kg (156 lb 1.4 oz)    Examination: General exam: Alert, awake, oriented to person and place; continued to report abdominal pain intermittently; per family report some blood-tinged stool still appreciated.  Patient refusing oral medications, with very poor insight and agitated. Respiratory system: Clear to auscultation. Respiratory effort normal. Cardiovascular system:RRR. No rubs or gallops. Positive SEM Gastrointestinal system: Abdomen is slightly bloated, mild tenderness to deep palpation in mid epigastric area; no guarding, no rebound. Normal bowel sounds heard. Central nervous system: Alert and awake, with some difficulty following commands. Oriented to person and place. No motor deficit. Extremities: No C/C/E, +pedal pulses Skin: No rashes, lesions or ulcers Psychiatry: Judgement and insight impaired due to dementia.    Data Reviewed: I have personally reviewed following labs and imaging studies  CBC: Recent Labs  Lab 09/16/17 2314 09/19/17 1058 09/20/17 0449 09/21/17 0607  WBC 25.0* 41.8* 34.2*  37.7*  NEUTROABS  --  37.2*  --   --   HGB 9.0* 6.3* 8.8* 7.8*  HCT 29.5* 20.3* 27.0* 24.1*  MCV 94.6 96.2 93.8 93.8  PLT 185 140* 110* 892*   Basic Metabolic Panel: Recent Labs  Lab 09/16/17 2314  NA 136  K 3.9  CL 103  CO2 24  GLUCOSE 138*  BUN 29*  CREATININE 1.77*  CALCIUM 9.2   GFR: Estimated Creatinine Clearance: 20.8 mL/min (A) (by C-G formula based on SCr of 1.77 mg/dL (H)).   Liver Function Tests: Recent Labs  Lab 09/16/17 2314  AST 22  ALT 13  ALKPHOS 92  BILITOT 0.7  PROT 6.5  ALBUMIN 2.4*   CBG: Recent Labs  Lab 09/16/17 2303  GLUCAP 132*   Urine analysis:    Component Value Date/Time   COLORURINE YELLOW 09/16/2017 2320   APPEARANCEUR HAZY (A) 09/16/2017 2320   APPEARANCEUR Clear 08/28/2017 1048   LABSPEC 1.013 09/16/2017 2320   PHURINE 7.0 09/16/2017 2320   GLUCOSEU NEGATIVE 09/16/2017 2320   HGBUR NEGATIVE 09/16/2017 2320   BILIRUBINUR NEGATIVE 09/16/2017 2320   BILIRUBINUR Negative 08/28/2017 1048   KETONESUR NEGATIVE 09/16/2017 2320   PROTEINUR NEGATIVE 09/16/2017 2320   UROBILINOGEN negative 05/22/2013 1249   UROBILINOGEN 0.2 12/28/2006 0933   NITRITE NEGATIVE  09/16/2017 2320   LEUKOCYTESUR SMALL (A) 09/16/2017 2320   LEUKOCYTESUR Negative 08/28/2017 1048    Recent Results (from the past 240 hour(s))  Culture, blood (Routine X 2) w Reflex to ID Panel     Status: None (Preliminary result)   Collection Time: 09/16/17 11:14 PM  Result Value Ref Range Status   Specimen Description BLOOD RIGHT WRIST DRAWN BY RN  Final   Special Requests   Final    BOTTLES DRAWN AEROBIC AND ANAEROBIC Blood Culture adequate volume   Culture   Final    NO GROWTH 4 DAYS Performed at [facility], [address], [place], [place] [address]    Report Status PENDING  Incomplete  Culture, blood (Routine X 2) w Reflex to ID Panel     Status: None (Preliminary result)   Collection Time: 09/16/17 11:43 PM  Result Value Ref Range Status   Specimen Description  BLOOD LEFT WRIST  Final   Special Requests   Final    BOTTLES DRAWN AEROBIC AND ANAEROBIC Blood Culture adequate volume   Culture   Final    NO GROWTH 4 DAYS Performed at [facility], [address], [place], [place] [address]    Report Status PENDING  Incomplete    Radiology Studies: Dg Abd 2 Views  Result Date: 09/20/2017 CLINICAL DATA:  Left-sided abdominal pain. EXAM: ABDOMEN - 2 VIEW COMPARISON:  Abdominal x-ray dated November 20, 2006. FINDINGS: Normal bowel gas pattern. No pneumoperitoneum. Unchanged 7 mm calculus in the right kidney. The visualized lungs are clear. No acute osseous abnormality. Prior sacral insufficiency fracture cement augmentation. Degenerative changes of the lumbar spine. IMPRESSION: 1. No acute abnormality. 2. Unchanged right nephrolithiasis. Electronically Signed   By: Titus Dubin M.D.   On: 09/20/2017 18:44    Scheduled Meds: . levothyroxine  50 mcg Intravenous Daily  . mouth rinse  15 mL Mouth Rinse BID  . senna  1 tablet Oral QHS  . sucralfate  1 g Oral TID WC & HS   Continuous Infusions: . sodium chloride 75 mL/hr at 09/21/17 1045  . pantoprozole (PROTONIX) infusion 8 mg/hr (09/21/17 1045)     LOS: 4 days    Time spent: 30 minutes.   Barton Dubois, MD [facility] Pager [phone]  If 7PM-7AM, please contact night-coverage [email or web address] Password [facility] 09/21/2017, 6:21 PM

## 2017-09-21 NOTE — Progress Notes (Signed)
Palliative Medicine RN Note: Rec'd call from pt's RN that family had questions for our on-call staff member. We do not have weekend coverage at [facility], so I called back to try to help via phone call. I spoke with son [name] [phone]). He had questions about going to SNF instead of rehab. I let him know that SW would be a better resource for that, and I got a message to SW asking them to reach out to him.  Marjie Skiff Diar Berkel, RN, BSN, [facility] Palliative Medicine Team 09/21/2017 10:35 AM Office [phone]

## 2017-09-22 LAB — CULTURE, BLOOD (ROUTINE X 2)
Culture: NO GROWTH
Culture: NO GROWTH
SPECIAL REQUESTS: ADEQUATE
SPECIAL REQUESTS: ADEQUATE

## 2017-09-22 LAB — CBC
HCT: 24.3 % — ABNORMAL LOW (ref 36.0–46.0)
Hemoglobin: 7.7 g/dL — ABNORMAL LOW (ref 12.0–15.0)
MCH: 30.3 pg (ref 26.0–34.0)
MCHC: 31.7 g/dL (ref 30.0–36.0)
MCV: 95.7 fL (ref 78.0–100.0)
Platelets: 155 10*3/uL (ref 150–400)
RBC: 2.54 MIL/uL — AB (ref 3.87–5.11)
RDW: 14.5 % (ref 11.5–15.5)
WBC: 23 10*3/uL — AB (ref 4.0–10.5)

## 2017-09-22 NOTE — Progress Notes (Signed)
PROGRESS NOTE    [name]  BZJ:[id number] DOB: 02-25-1926 DOA: 09/16/2017 PCP: Chipper Herb, MD    Brief Narrative:  84 year old female with medical history significant for hypertension, dyslipidemia, hypothyroidism, low back pain with neuropathy and a recent treatment with Augmentin for UTI who was brought to the ED via EMS due to ongoing weakness and altered mental status changes.  There is concern for sepsis in the setting of partially treated UTI.   Patient with acute blood loss anemia in the setting of upper GIB; become unresponsive and with soft BP. Hgb down to 6.3. Long discussion with family and meeting with palliative care. Plan is for non invasive treatment. She is DNR/DNI.  Assessment & Plan: 1-sepsis due to UTI: Patient met sepsis criteria on admission with elevated WBCs, abnormal urinalysis suggesting source of infection, low blood pressure and elevated heart rate. -patient initially treated with IV cipro -mentation still very off, and given concerns for fluoroquinolones and AMS in elderly, abx's switched to Bactrim. -patient refusing PO meds and without specific source of infection. -has remained afebrile -denies dysuria  -will hold on further antibiotics -main goal is keeping her comfortable' after long discussion with family today, they are entertaining transition to full comfort care and hospice.   2-acute blood loss anemia and hypovolemia -IVF's and blood transfusion given; Hgb 7.8 today. -follow Hgb trend  -still having some blood in her stools, but minimal. -continue IV PPI -patient with prior hx of peptic ulcer disease and has been actively using NSAID's prior to admission. Most likely experiencing upper GIB. -lovenox discontinued -will allow full liquid diet  -family declined EGD  3-HTN -BP soft -will hold antihypertensive therapy -continue IVF's   4-hypothyroidism -continue synthroid for now (given IV,as she is refusing oral meds) -TSH and T4   low, dose adjusted to 19mg -repeat thyroid panel in 6 weeks -continue IV for now.  5-toxic encephalopathy: in the setting of underlying dementia -continue supportive care -will resume seroquel when able to take PO's again. -low dose ativan for agitation ordered   5-neuropathy -chronic -will plan to continue Neurontin if she take it by mouth; currently refusing meds.   6-dyslipidemia -will discontinue lipitor  -minimizing pill burden  -she continue refusing meds. -transitioning care to full comfort   7-weakness/deconditioning  -patient with difficulty following commands and declining meds/services in house. Also not eating much. -further discussion with family has triggered desire to explore hospice home and focus on full comfort care -CM/hospice consult requested -will continue symptomatic management  -ativan and morphine ordered   DVT prophylaxis: SCD's Code Status: DNR/DNI Family Communication: Son at bedside. Disposition Plan: patient is alert, awake and pleasantly confused. Good response to PRBC transfusion and PPI. Family opting for hospice care. Even on son today ask about possibility of doing an EGD. He will discussed with rest of family and if requested will ask GI to provide professional opinion on the matter. No signs of ongoing bleeding at this time.   Consultants:   Palliative care  Hospice   Procedures:   See below for x-ray reports.  Antimicrobials:  Anti-infectives (From admission, onward)   Start     Dose/Rate Route Frequency Ordered Stop   09/18/17 2100  ciprofloxacin (CIPRO) IVPB 400 mg  Status:  Discontinued     400 mg 200 mL/hr over 60 Minutes Intravenous Every 24 hours 09/18/17 0950 09/18/17 1103   09/18/17 1200  sulfamethoxazole-trimethoprim (BACTRIM DS,SEPTRA DS) 800-160 MG per tablet 0.5 tablet  Status:  Discontinued  0.5 tablet Oral Every 12 hours 09/18/17 1103 09/19/17 0933   09/17/17 2000  ciprofloxacin (CIPRO) IVPB 400 mg  Status:   Discontinued     400 mg 200 mL/hr over 60 Minutes Intravenous Every 18 hours 09/17/17 1356 09/18/17 0950   09/17/17 1600  ciprofloxacin (CIPRO) IVPB 400 mg  Status:  Discontinued     400 mg 200 mL/hr over 60 Minutes Intravenous Every 12 hours 09/17/17 0402 09/17/17 1356   09/17/17 0400  ciprofloxacin (CIPRO) IVPB 400 mg     400 mg 200 mL/hr over 60 Minutes Intravenous  Once 09/17/17 0358 09/17/17 0509       Subjective: No fever, no chest pain, vital signs remained stable.  Patient refusing medications by mouth and barely eating or drinking anything.  She continues to be oriented to person mainly and pleasantly confused.  Objective: Vitals:   09/21/17 2134 09/21/17 2209 09/22/17 0523 09/22/17 1415  BP: (!) 139/112 (!) 138/58 (!) 156/63 (!) 149/58  Pulse: 77 80 83 84  Resp:  18 18 18   Temp: 97.8 F (36.6 C) 98.6 F (37 C) 97.7 F (36.5 C)   TempSrc: Oral Oral Oral   SpO2: 99% 100% 90% 99%  Weight:      Height:        Intake/Output Summary (Last 24 hours) at 09/22/2017 1758 Last data filed at 09/22/2017 1700 Gross per 24 hour  Intake 5306.25 ml  Output 800 ml  Net 4506.25 ml   Filed Weights   09/16/17 2305 09/17/17 0454  Weight: 68 kg (150 lb) 70.8 kg (156 lb 1.4 oz)    Examination: General exam: Alert, oriented to person. Continue to have intermittent episodes of agitation and is refusing oral meds. Patient is barely eating or drinking at this moment.  Respiratory system: Clear to auscultation. Respiratory effort normal. Cardiovascular system:RRR. No murmurs, rubs, gallops. Positive SEM. Gastrointestinal system: Abdomen is slightly bloated. no guarding, no masses appreciated. Mild tenderness elicit with deep palpation, positive BS.  Central nervous system: Alert and oriented. No focal neurological deficits. Extremities: No C/C/E, +pedal pulses Skin: No rashes, lesions or ulcers Psychiatry: Judgement and insight appear impaired in the setting of underlying dementia.     Data Reviewed: I have personally reviewed following labs and imaging studies  CBC: Recent Labs  Lab 09/16/17 2314 09/19/17 1058 09/20/17 0449 09/21/17 0607 09/22/17 0604  WBC 25.0* 41.8* 34.2* 37.7* 23.0*  NEUTROABS  --  37.2*  --   --   --   HGB 9.0* 6.3* 8.8* 7.8* 7.7*  HCT 29.5* 20.3* 27.0* 24.1* 24.3*  MCV 94.6 96.2 93.8 93.8 95.7  PLT 185 140* 110* 133* 509   Basic Metabolic Panel: Recent Labs  Lab 09/16/17 2314  NA 136  K 3.9  CL 103  CO2 24  GLUCOSE 138*  BUN 29*  CREATININE 1.77*  CALCIUM 9.2   GFR: Estimated Creatinine Clearance: 20.8 mL/min (A) (by C-G formula based on SCr of 1.77 mg/dL (H)).   Liver Function Tests: Recent Labs  Lab 09/16/17 2314  AST 22  ALT 13  ALKPHOS 92  BILITOT 0.7  PROT 6.5  ALBUMIN 2.4*   CBG: Recent Labs  Lab 09/16/17 2303  GLUCAP 132*   Urine analysis:    Component Value Date/Time   COLORURINE YELLOW 09/16/2017 2320   APPEARANCEUR HAZY (A) 09/16/2017 2320   APPEARANCEUR Clear 08/28/2017 1048   LABSPEC 1.013 09/16/2017 2320   PHURINE 7.0 09/16/2017 2320   GLUCOSEU NEGATIVE 09/16/2017 2320  HGBUR NEGATIVE 09/16/2017 2320   BILIRUBINUR NEGATIVE 09/16/2017 2320   BILIRUBINUR Negative 08/28/2017 1048   KETONESUR NEGATIVE 09/16/2017 2320   PROTEINUR NEGATIVE 09/16/2017 2320   UROBILINOGEN negative 05/22/2013 1249   UROBILINOGEN 0.2 12/28/2006 0933   NITRITE NEGATIVE 09/16/2017 2320   LEUKOCYTESUR SMALL (A) 09/16/2017 2320   LEUKOCYTESUR Negative 08/28/2017 1048    Recent Results (from the past 240 hour(s))  Culture, blood (Routine X 2) w Reflex to ID Panel     Status: None   Collection Time: 09/16/17 11:14 PM  Result Value Ref Range Status   Specimen Description BLOOD RIGHT WRIST DRAWN BY RN  Final   Special Requests   Final    BOTTLES DRAWN AEROBIC AND ANAEROBIC Blood Culture adequate volume   Culture   Final    NO GROWTH 6 DAYS Performed at [facility], [address] [place], [place] [address]     Report Status 09/22/2017 FINAL  Final  Culture, blood (Routine X 2) w Reflex to ID Panel     Status: None   Collection Time: 09/16/17 11:43 PM  Result Value Ref Range Status   Specimen Description BLOOD LEFT WRIST  Final   Special Requests   Final    BOTTLES DRAWN AEROBIC AND ANAEROBIC Blood Culture adequate volume   Culture   Final    NO GROWTH 6 DAYS Performed at [facility], [address] [place], [place] [address]    Report Status 09/22/2017 FINAL  Final    Radiology Studies: Dg Abd 2 Views  Result Date: 09/20/2017 CLINICAL DATA:  Left-sided abdominal pain. EXAM: ABDOMEN - 2 VIEW COMPARISON:  Abdominal x-ray dated November 20, 2006. FINDINGS: Normal bowel gas pattern. No pneumoperitoneum. Unchanged 7 mm calculus in the right kidney. The visualized lungs are clear. No acute osseous abnormality. Prior sacral insufficiency fracture cement augmentation. Degenerative changes of the lumbar spine. IMPRESSION: 1. No acute abnormality. 2. Unchanged right nephrolithiasis. Electronically Signed   By: Titus Dubin M.D.   On: 09/20/2017 18:44    Scheduled Meds: . levothyroxine  50 mcg Intravenous Daily  . mouth rinse  15 mL Mouth Rinse BID  . senna  1 tablet Oral QHS  . sucralfate  1 g Oral TID WC & HS   Continuous Infusions: . sodium chloride 75 mL/hr at 09/22/17 0021     LOS: 5 days    Time spent: 30 minutes.   Barton Dubois, MD [facility] Pager [phone]  If 7PM-7AM, please contact night-coverage [email or web address] Password [facility] 09/22/2017, 5:58 PM

## 2017-09-23 DIAGNOSIS — K921 Melena: Secondary | ICD-10-CM

## 2017-09-23 DIAGNOSIS — Z515 Encounter for palliative care: Secondary | ICD-10-CM

## 2017-09-23 MED ORDER — ONDANSETRON 8 MG PO TBDP: 8.0000 mg | ORAL_TABLET | Freq: Three times a day (TID) | ORAL | 0 refills | Status: AC | PRN

## 2017-09-23 MED ORDER — SENNA 8.6 MG PO TABS: 1.0000 | ORAL_TABLET | Freq: Every day | ORAL | Status: AC

## 2017-09-23 MED ORDER — PANTOPRAZOLE SODIUM 40 MG PO TBEC: 40.0000 mg | DELAYED_RELEASE_TABLET | Freq: Two times a day (BID) | ORAL | Status: DC

## 2017-09-23 MED ORDER — PANTOPRAZOLE SODIUM 40 MG PO TBEC: 40.0000 mg | DELAYED_RELEASE_TABLET | Freq: Two times a day (BID) | ORAL | Status: AC

## 2017-09-23 MED ORDER — LORAZEPAM 2 MG/ML IJ SOLN: 1.0000 mg | Freq: Four times a day (QID) | INTRAMUSCULAR | 0 refills | Status: AC | PRN

## 2017-09-23 MED ORDER — MORPHINE SULFATE (PF) 2 MG/ML IV SOLN: 1.0000 mg | INTRAVENOUS | 0 refills | Status: AC | PRN

## 2017-09-23 MED ORDER — ACETAMINOPHEN 325 MG PO TABS: 650.0000 mg | ORAL_TABLET | Freq: Four times a day (QID) | ORAL | Status: AC | PRN

## 2017-09-23 MED ORDER — MORPHINE SULFATE (CONCENTRATE) 10 MG/0.5ML PO SOLN: 2.5000 mg | ORAL | 0 refills | Status: AC | PRN

## 2017-09-23 NOTE — Progress Notes (Signed)
Patient is resting in bed quietly. Patient is still refusing to take anything by mouth. Attempted to provide water to patient and she pushes hand and cup away and says "No" Son is at bedside. Will continue to monitor.

## 2017-09-23 NOTE — Discharge Summary (Signed)
Physician Discharge Summary  [name] PJS:[id number] DOB: 1926/03/02 DOA: 09/16/2017  PCP: Chipper Herb, MD  Admit date: 09/16/2017 Discharge date: 09/23/2017  Time spent: 35 minutes  Recommendations for Outpatient Follow-up:  Comfort feeding Full comfort care and symptomatic management No future rehospitalization Oral medications (especially antiacid drugs) to be given only if the patient accept to take them.  Discharge Diagnoses:  Principal Problem:   Altered mental status Active Problems:   Essential hypertension   Hyperlipidemia   Hypothyroidism   AKI (acute kidney injury) ([place])   Gastrointestinal hemorrhage with melena   Palliative care encounter   Discharge Condition: VSS and patient in no acute distress.   Diet recommendation: comfort feeding   Filed Weights   09/16/17 2305 09/17/17 0454  Weight: 68 kg (150 lb) 70.8 kg (156 lb 1.4 oz)    History of present illness:  As per H&P written by Dr. Manuella Ghazi on seven 09/17/17 84 y.o. female with medical history significant for hypertension, dyslipidemia, hypothyroidism, low back pain with neuropathy, and recent treatment with Augmentin for UTI who was brought to the ED via EMS due to some weakness and altered mental status.  EMS reported some initial hypotension with blood pressure 80/50 for which she was given IV fluid bolus with improvement.  She was recently being treated for UTI with Augmentin and actually denied any urinary symptoms or other complaints.  She is much more alert and awake this morning and has been started on IV fluid as well as IV ciprofloxacin for her UTI.  Urine cultures are currently pending.   ED Course: Stable vital signs with leukocytosis of 25,000 noted.  Hemoglobin of 9 which appears to be stable compared to prior.  Creatinine is noted to be 1.77 with baseline of 0.8-0.9.  She has been started on some IV fluid and IV ciprofloxacin.  She appears more awake and alert this morning and is able to answer  questions  Hospital Course:  1-sepsis due to UTI: Patient met sepsis criteria on admission with elevated WBCs, abnormal urinalysis suggesting source of infection, low blood pressure and elevated heart rate. -patient initially treated with IV cipro -mentation still very off, and given concerns for fluoroquinolones and AMS in elderly, abx's switched to Bactrim. -patient refusing PO meds and without specific source of infection. -has remained afebrile -denies dysuria  -No further antibiotics -main goal is keeping her comfortable and to focus on symptom management only. -patient discharge to residential hospice. .   2-acute blood loss anemia and hypovolemia -Hgb has remained stable and patient good VS. -still having some blood in her stools, but minimal. -no further blood work anticipated; plan is for comfort care only. -patient with prior hx of peptic ulcer disease and has been actively using NSAID's prior to admission. Most likely experiencied upper GIB. -PPI by mouth if she decide to take it. -will allow comfort feeding -family declined EGD  3-HTN -BP has a stabilized -Patient refusing oral medications -Will pursuit comfort and symptomatic management only.   4-hypothyroidism -After discussion with family goals of care is to transition patient into comfort and symptom management only. -In the setting of refusing oral medications will discontinue Synthroid at this time.  5-toxic encephalopathy: in the setting of underlying dementia -continue ativan for comfort -patient transition to full comfort care.   5-neuropathy -chronic -will plan to continue Neurontin if she take it by mouth -plan is comfort care and symptoms management only.  6-dyslipidemia -will discontinue lipitor  -minimizing pill burden and  focusing on comfort care only  7-weakness/deconditioning  -patient with difficulty following commands and declining meds/services in house. Also not eating. -Patient has  been accepted to residential hospice and family in agreement to pursuit just full comfort care. -will follow symptomatic management only -ativan and morphine ordered  -will discharge with IV, as she is refusing oral meds, to have available route to provide comfort meds.   Procedures:  See below for x-ray reports.   Consultations:  Palliative care   Hospice  Discharge Exam: Vitals:   09/23/17 0531 09/23/17 1434  BP: (!) 148/57 (!) 142/49  Pulse: 98 100  Resp: 18 18  Temp: 98.8 F (37.1 C) 97.9 F (36.6 C)  SpO2:  100%    General exam: Alert, oriented to person. Continue to have intermittent episodes of agitation and continue to refuse oral meds. Patient is barely eating or drinking at this moment. no fever. Respiratory system: Clear to auscultation. Respiratory effort normal. Cardiovascular system:RRR. No murmurs, rubs, gallops. Positive SEM. Gastrointestinal system: Abdomen is slightly bloated. no guarding, no masses appreciated. Mild tenderness elicit with deep palpation, positive BS.  Central nervous system: Alert and oriented. No focal neurological deficits. Extremities: No C/C/E, +pedal pulses Skin: No rashes, lesions or ulcers Psychiatry: Judgement and insight appear impaired in the setting of underlying dementia.   Discharge Instructions   Discharge Instructions    Discharge instructions   Complete by:  As directed    Comfort feeding Full comfort care and symptomatic management No future rehospitalization Oral medications (especially antiacid drugs) to be given only if the patient accept to take them.     Allergies as of 09/23/2017      Reactions   Prednisone Other (See Comments)   Bloated. Unable to determine if intolerance with water retention, or Possible Allergic response with swelling.      Medication List    STOP taking these medications   atorvastatin 80 MG tablet Commonly known as:  LIPITOR   calcium citrate-vitamin D 315-200 MG-UNIT  tablet Commonly known as:  CITRACAL+D   DULoxetine 30 MG capsule Commonly known as:  CYMBALTA   fluticasone 50 MCG/ACT nasal spray Commonly known as:  FLONASE   levothyroxine 150 MCG tablet Commonly known as:  SYNTHROID, LEVOTHROID   meclizine 25 MG tablet Commonly known as:  ANTIVERT   multivitamin with minerals Tabs tablet   potassium chloride 10 MEQ tablet Commonly known as:  K-DUR   QUEtiapine 50 MG tablet Commonly known as:  SEROQUEL   Vitamin D3 2000 units Tabs     TAKE these medications   acetaminophen 325 MG tablet Commonly known as:  TYLENOL Take 2 tablets (650 mg total) by mouth every 6 (six) hours as needed for mild pain (or Fever >/= 101).   gabapentin 400 MG capsule Commonly known as:  NEURONTIN Take 1 capsule by mouth at bedtime.   LORazepam 2 MG/ML injection Commonly known as:  ATIVAN Inject 0.5 mLs (1 mg total) into the vein every 6 (six) hours as needed for anxiety (agitation).   morphine 2 MG/ML injection Inject 0.5 mLs (1 mg total) into the vein every 4 (four) hours as needed (pain, comfort).   morphine CONCENTRATE 10 MG/0.5ML Soln concentrated solution Take 0.13-0.25 mLs (2.6-5 mg total) by mouth every 4 (four) hours as needed for severe pain or shortness of breath.   ondansetron 8 MG disintegrating tablet Commonly known as:  ZOFRAN ODT Take 1 tablet (8 mg total) by mouth every 8 (eight) hours as needed  for nausea or vomiting.   pantoprazole 40 MG tablet Commonly known as:  PROTONIX Take 1 tablet (40 mg total) by mouth 2 (two) times daily.   polyvinyl alcohol 1.4 % ophthalmic solution Commonly known as:  LIQUIFILM TEARS Place 1 drop into both eyes as needed for dry eyes.   senna 8.6 MG Tabs tablet Commonly known as:  SENOKOT Take 1 tablet (8.6 mg total) by mouth at bedtime.      Allergies  Allergen Reactions  . Prednisone Other (See Comments)    Bloated. Unable to determine if intolerance with water retention, or Possible  Allergic response with swelling.   Contact information for after-discharge care    Destination    [facility] .   Service:  Skilled Nursing Contact information: [address] [place] [phone]              The results of significant diagnostics from this hospitalization (including imaging, microbiology, ancillary and laboratory) are listed below for reference.    Significant Diagnostic Studies: Dg Chest 2 View  Result Date: 09/17/2017 CLINICAL DATA:  Cough.  Altered mental status.  Recent fall. EXAM: CHEST - 2 VIEW COMPARISON:  08/17/2017 FINDINGS: The cardiomediastinal contours are unchanged, heart upper normal in size. Atherosclerosis of the thoracic aorta. Minimal bibasilar atelectasis. Pulmonary vasculature is normal. No consolidation, pleural effusion, or pneumothorax. No acute osseous abnormalities are seen. Degenerative change of both shoulders. Surgical clips at the thoracic inlet. IMPRESSION: 1. No acute findings or change from prior exam. 2.  Aortic Atherosclerosis (ICD10-I70.0). Electronically Signed   By: Jeb Levering M.D.   On: 09/17/2017 01:08   Ct Head Wo Contrast  Result Date: 09/17/2017 CLINICAL DATA:  Altered mental status and weakness. Altered level of consciousness. Recent fall. EXAM: CT HEAD WITHOUT CONTRAST TECHNIQUE: Contiguous axial images were obtained from the base of the skull through the vertex without intravenous contrast. COMPARISON:  Head CT 08/17/2017 FINDINGS: Brain: Unchanged atrophy and chronic small vessel ischemia. No intracranial hemorrhage, mass effect, or midline shift. No hydrocephalus. The basilar cisterns are patent. No evidence of territorial infarct or acute ischemia. No extra-axial or intracranial fluid collection. Vascular: Atherosclerosis of skullbase vasculature without hyperdense vessel or abnormal calcification. Skull: No fracture or focal lesion. Sinuses/Orbits: No acute finding.   Bilateral cataract resection. Other: None. IMPRESSION: 1.  No acute intracranial abnormality. 2. Unchanged atrophy and chronic small vessel ischemia. Electronically Signed   By: Jeb Levering M.D.   On: 09/17/2017 00:56   Dg Abd 2 Views  Result Date: 09/20/2017 CLINICAL DATA:  Left-sided abdominal pain. EXAM: ABDOMEN - 2 VIEW COMPARISON:  Abdominal x-ray dated November 20, 2006. FINDINGS: Normal bowel gas pattern. No pneumoperitoneum. Unchanged 7 mm calculus in the right kidney. The visualized lungs are clear. No acute osseous abnormality. Prior sacral insufficiency fracture cement augmentation. Degenerative changes of the lumbar spine. IMPRESSION: 1. No acute abnormality. 2. Unchanged right nephrolithiasis. Electronically Signed   By: Titus Dubin M.D.   On: 09/20/2017 18:44   Dg Hip Unilat With Pelvis 2-3 Views Left  Result Date: 09/17/2017 CLINICAL DATA:  Fall on the 4th of July with left hip pain EXAM: DG HIP (WITH OR WITHOUT PELVIS) 2-3V LEFT COMPARISON:  Pelvis CT 11/27/2016 FINDINGS: Remote right obturator ring and iliac wing fractures. Remote bilateral sacral ala fracture and sacroplasty. No acute fracture or hip dislocation. Prominent osteopenia. Mild acetabular spurring on the left. IMPRESSION: 1. No acute finding. 2. Osteopenia with remote pelvic  fractures. Electronically Signed   By: Monte Fantasia M.D.   On: 09/17/2017 01:09    Microbiology: Recent Results (from the past 240 hour(s))  Culture, blood (Routine X 2) w Reflex to ID Panel     Status: None   Collection Time: 09/16/17 11:14 PM  Result Value Ref Range Status   Specimen Description BLOOD RIGHT WRIST DRAWN BY RN  Final   Special Requests   Final    BOTTLES DRAWN AEROBIC AND ANAEROBIC Blood Culture adequate volume   Culture   Final    NO GROWTH 6 DAYS Performed at [facility], [address], [place], [place] [address]    Report Status 09/22/2017 FINAL  Final  Culture, blood (Routine X 2) w Reflex to ID Panel      Status: None   Collection Time: 09/16/17 11:43 PM  Result Value Ref Range Status   Specimen Description BLOOD LEFT WRIST  Final   Special Requests   Final    BOTTLES DRAWN AEROBIC AND ANAEROBIC Blood Culture adequate volume   Culture   Final    NO GROWTH 6 DAYS Performed at [facility], [address] [place], [place] [address]    Report Status 09/22/2017 FINAL  Final     Labs: Basic Metabolic Panel: Recent Labs  Lab 09/16/17 2314  NA 136  K 3.9  CL 103  CO2 24  GLUCOSE 138*  BUN 29*  CREATININE 1.77*  CALCIUM 9.2   Liver Function Tests: Recent Labs  Lab 09/16/17 2314  AST 22  ALT 13  ALKPHOS 92  BILITOT 0.7  PROT 6.5  ALBUMIN 2.4*   CBC: Recent Labs  Lab 09/16/17 2314 09/19/17 1058 09/20/17 0449 09/21/17 0607 09/22/17 0604  WBC 25.0* 41.8* 34.2* 37.7* 23.0*  NEUTROABS  --  37.2*  --   --   --   HGB 9.0* 6.3* 8.8* 7.8* 7.7*  HCT 29.5* 20.3* 27.0* 24.1* 24.3*  MCV 94.6 96.2 93.8 93.8 95.7  PLT 185 140* 110* 133* 155   CBG: Recent Labs  Lab 09/16/17 2303  GLUCAP 132*    Signed:  Barton Dubois MD.  [facility] 09/23/2017, 3:09 PM

## 2017-09-23 NOTE — Care Management (Signed)
CM consulted for residential hospice referral. This is a CSW funtion. CSW consulted.

## 2017-09-23 NOTE — Progress Notes (Signed)
Patient transported to [facility] by EMS. Son at bedside and updated.

## 2017-09-23 NOTE — Clinical Social Work Note (Signed)
Patient referred to [facility].     [name], Clydene Pugh, LCSW

## 2017-09-23 NOTE — Clinical Social Work Note (Signed)
Patient Information   Patient Name Joselinne, Lawal ([id number]) Sex Female DOB 01/31/1926   SSN [id number]   Room Bed  A303 A303-01  Patient Demographics   Address [place] [address] Phone [phone] (Home) [phone] (Mobile)  Patient Ethnicity & Race   Ethnic Group Patient Race  Not Hispanic or Latino White or Caucasian  Emergency Contact(s)   Name Relation Home Work Mobile  [place] Son [phone]    [place],Christy Grandaughter [phone]    [place], [place] [phone]    Documents on File    Status Date Received Description  Documents for the Patient  Driver's License Not Received  [facility] Documentation Not Received    [facility] Documentation Not Received    [facility] Documentation Not Received    [facility] Documentation Not Received    [place] Not Received    [place] E-Signature HIPAA Notice of Privacy Received 05/09/11 [place] E-Signature HIPAA Notice of Privacy Spanish Not Received    Insurance Card Not Received    Advance Directives/Living Will/HCPOA/POA Not Received    Financial Application Not Received    Release of Information Not Received    Insurance Card Received 05/21/11 MEDICARE & BCBS SUP  [place] HIPAA NOTICE OF PRIVACY - Scanned Received 82/50/53 BCG  Driver's License Not Received    Driver's License Received 97/67/34 BCG EXP 07/31/2014 CCS  [place] E-Signature HIPAA Notice of Privacy Received 05/21/11 [facility]  [place] HIPAA NOTICE OF PRIVACY - Scanned Received 05/21/11 CCS  Insurance Card Received 04/15/12 BCG MCR/BCBS SUPP [facility]CS  Release of Information Received 04/15/12 BCG NO TO [employer] HIPAA Notice of Privacy - Scanned Not Received    [employer] Notice of Privacy - E Signature Not Received    Advanced Beneficiary Notice (ABN) Not Received    Insurance Card Received  19/37/90 GIM  Driver's License Received 24/09/73 GIM  [place] HIPAA NOTICE OF PRIVACY - Scanned Received 11/19/12 GIM  Insurance Card     [employer] Notice of Privacy - Scanned Received 08/11/13 [place] [facility] HIPAA Notice of Privacy - E Signature Signed 08/11/13 CCS  Other Not Received 08/11/13   [place] HIPAA NOTICE OF PRIVACY - Scanned Received 08/11/13 PHI CCS  E-Signature AOB Spanish Not Received    Other Photo ID Not Received    Insurance Card Received 08/18/14 [facility]/BCBS  Insurance Card Received 08/23/14 BCG  AMB Correspondence  08/23/14 OFFICE NOTE [facility]  AMB Correspondence  12/07/14 REFILL AUTHORIZATION REQUEST [facility]HOME  AMB Correspondence  12/03/14 OFFICE VISIT [facility]  AMB Provider Completed Forms  04/06/15 PRE OPERATIVE CLEARANCE [facility]  Insurance Card Received 09/06/16 Medicare/BCBS 315 rec 06-28  AMB Correspondence  07/26/15 NOTICE OF APPROVAL BLUE[employer]OSS BLUESHIELD OF [place]  Release of Information Received 01/06/16 [facility]  AMB Provider Completed Forms Received 02/07/17 FL-2  Insurance Card Received 04/09/17 mcr/wrfm  [place] E-Signature HIPAA Notice of Privacy Signed 09/16/17   Other Not Received (Deleted)    Documents for the Encounter  AOB (Assignment of Insurance Benefits) Not Received    E-signature AOB Signed 09/16/17   MEDICARE RIGHTS Not Received    E-signature Medicare Rights Signed 09/16/17   Cardiac Monitoring Strip Received 09/17/17   ED Patient Billing Extract   ED PB Billing Extract  Cardiac Monitoring Strip Shift Summary Received 09/17/17   Admission Information  Attending Provider Admitting Provider Admission Type Admission Date/Time  Barton Dubois, MD Rodena Goldmann, DO Emergency 09/16/17 2256  Discharge Date Hospital Service Auth/Cert Status Service Area   Internal Medicine Incomplete [facility]  Unit Room/Bed Admission Status   AP-DEPT 300  A303/A303-01 Admission (Confirmed)   Admission   Complaint  .  Hospital Account   Name Acct ID Class Status Primary Coverage  Willo, Yoon [id number] Inpatient Open MEDICARE - MEDICARE PART A AND B      Guarantor Account (for Hospital Account [id number])   Name Relation to Pt Service Area Active? Acct Type  Renelda Mom T Self CHSA Yes Personal/Family  Address Phone    [address], [place] [address] [phone])        Coverage Information (for Hospital Account [id number])   1. [place] PART A AND B   F/O Payor/Plan Precert #  MEDICARE/MEDICARE PART A AND B   Subscriber Subscriber #  Coral, Timme [id number]  Address Phone  [address] [place], [place] [address]   2. BLUE CROSS BLUE SHIELD/BCBS SUPPLEMENT   F/O Payor/Plan Precert #  BLUE CROSS BLUE SHIELD/BCBS SUPPLEMENT   Subscriber Subscriber #  [place], [place] [id number]  Address Phone  [address] [place], [place] [address] [phone]

## 2017-09-23 NOTE — Care Management Important Message (Signed)
Important Message  Patient Details  Name: [name] MRN: [id number] Date of Birth: [birth year]   Medicare Important Message Given:  Yes    Shelda Altes 09/23/2017, 11:37 AM

## 2017-09-23 NOTE — Clinical Social Work Note (Signed)
Son, [name], made aware of discharge. LCSW sent discharge summary to [facility].  RN to call EMS when ready.    LCSW signing off.      Siarah Deleo, Clydene Pugh, LCSW

## 2017-09-24 ENCOUNTER — Telehealth: Payer: Self-pay | Admitting: Family Medicine

## 2017-09-24 NOTE — Telephone Encounter (Signed)
Patient of Dr Judene Companion

## 2017-09-24 NOTE — Telephone Encounter (Signed)
Pt was put in Hospice in [facility] yesterday

## 2017-09-25 NOTE — Telephone Encounter (Signed)
Aware, keeping her and her family in my prayers

## 2017-10-10 DEATH — deceased

## 2017-11-14 ENCOUNTER — Ambulatory Visit: Payer: Medicare Other | Admitting: Family Medicine

## 2017-11-15 ENCOUNTER — Ambulatory Visit: Payer: Medicare Other | Admitting: Family Medicine

## 2018-12-16 IMAGING — CT CT PELVIS W/O CM
2 of 3 series · 16 of 46 positions shown, 18 images · non-contrast
Comparison: MRI 09/06/2016

CLINICAL DATA: Hip fracture.

EXAM:
CT PELVIS WITHOUT CONTRAST
TECHNIQUE: Multidetector CT imaging of the pelvis was performed following the
standard protocol without intravenous contrast.

[Series 3: axial st · axial · 0.73mm/px · z∈[-553,-347]mm · 13 of 119 slices shown, 15 images]
[im 8/119  soft-tissue]
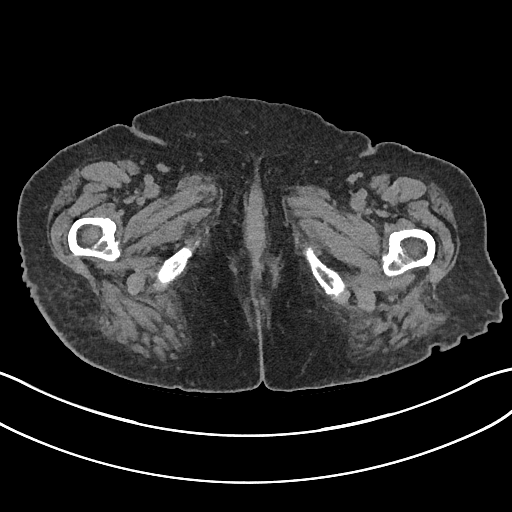
[im 8/119  bone]
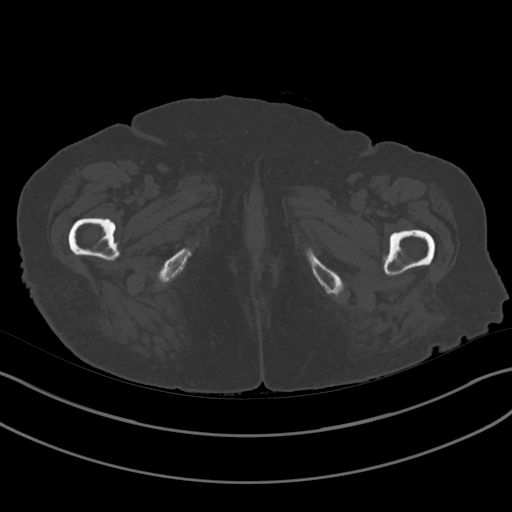
[im 16/119  soft-tissue]
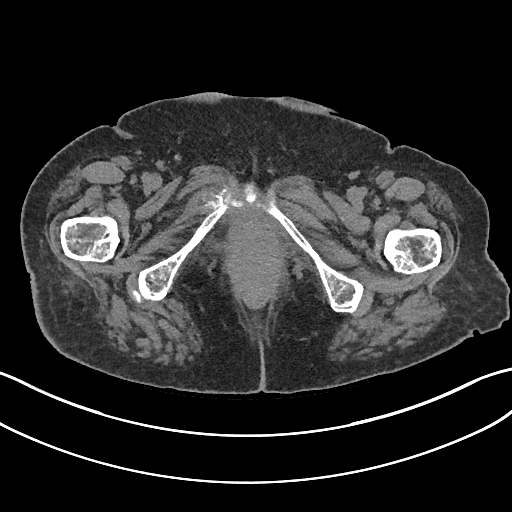
[im 23/119  soft-tissue]
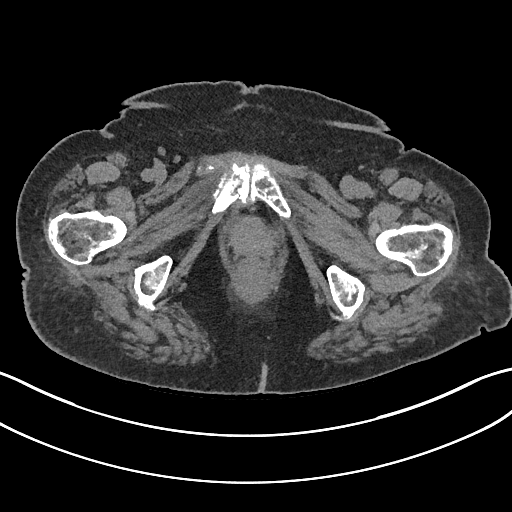
[im 35/119  soft-tissue]
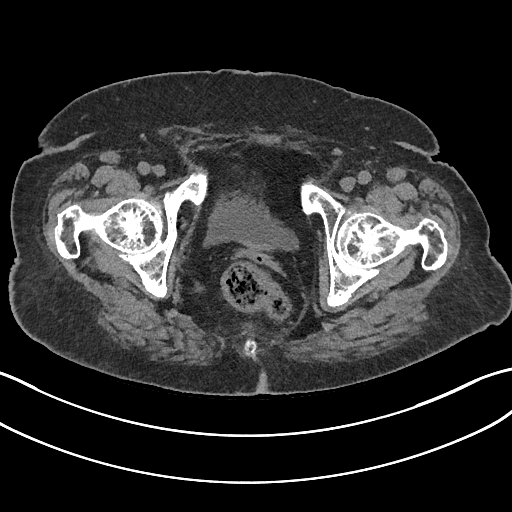
[im 42/119  soft-tissue]
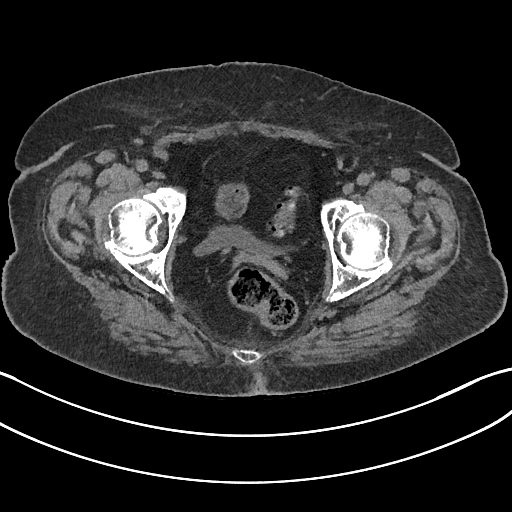
[im 50/119  soft-tissue]
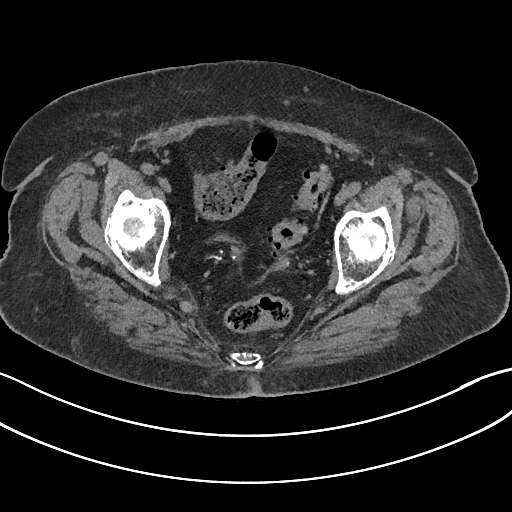
[im 61/119  soft-tissue]
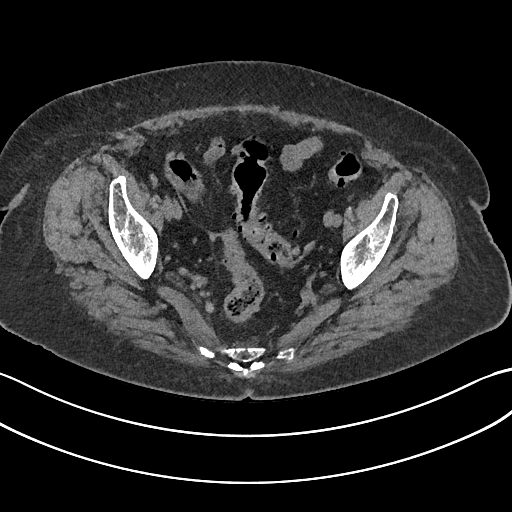
[im 69/119  soft-tissue]
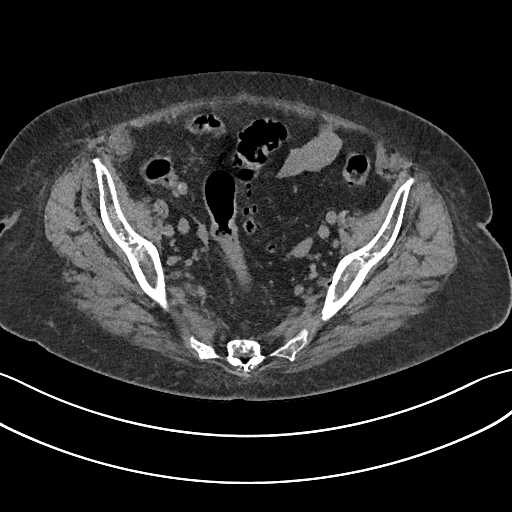
[im 77/119  soft-tissue]
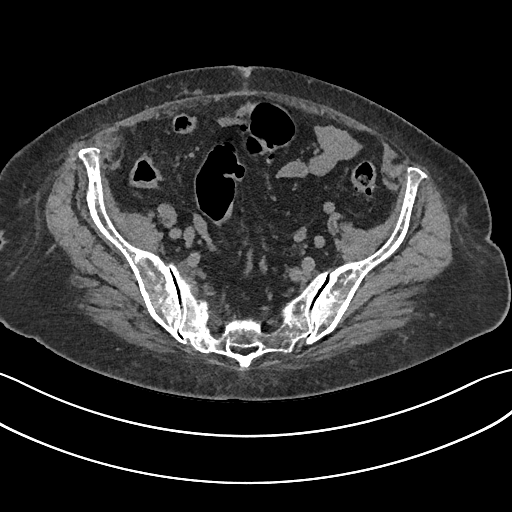
[im 77/119  bone]
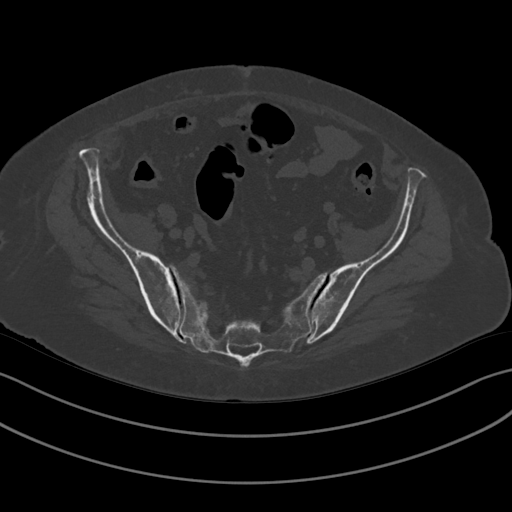
[im 84/119  soft-tissue]
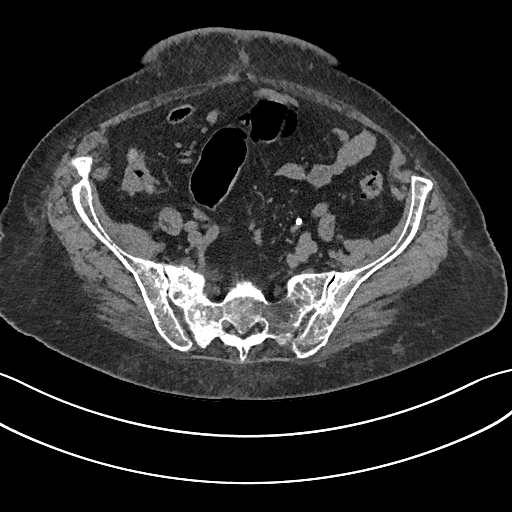
[im 96/119  soft-tissue]
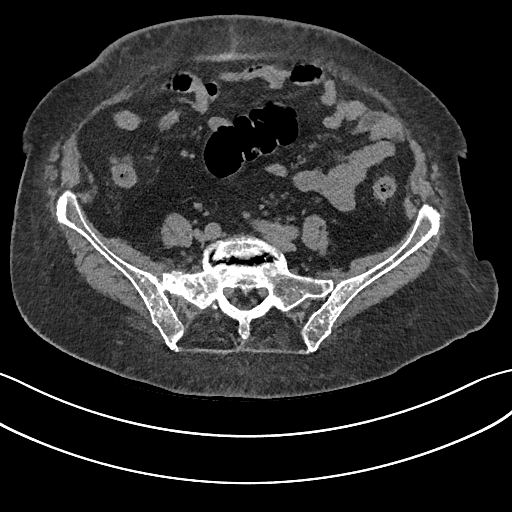
[im 103/119  soft-tissue]
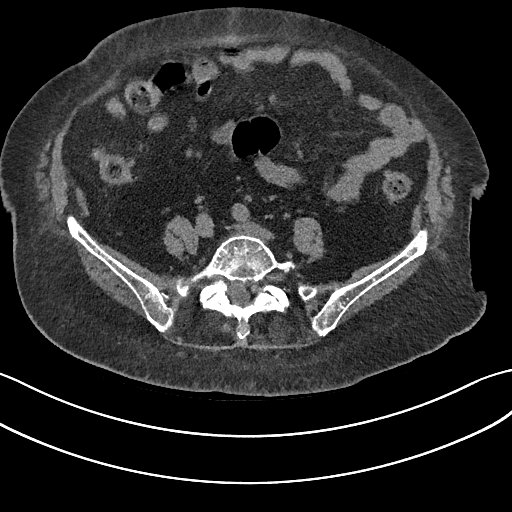
[im 111/119  soft-tissue]
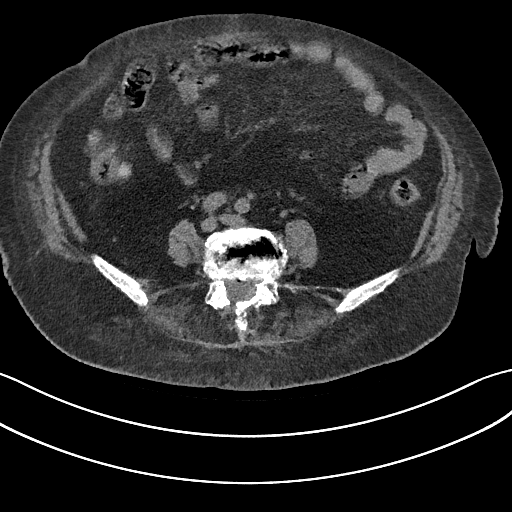

[Series 8: coronal st · coronal · 0.50mm/px · 3 of 187 slices shown]
[im 63/187  soft-tissue]
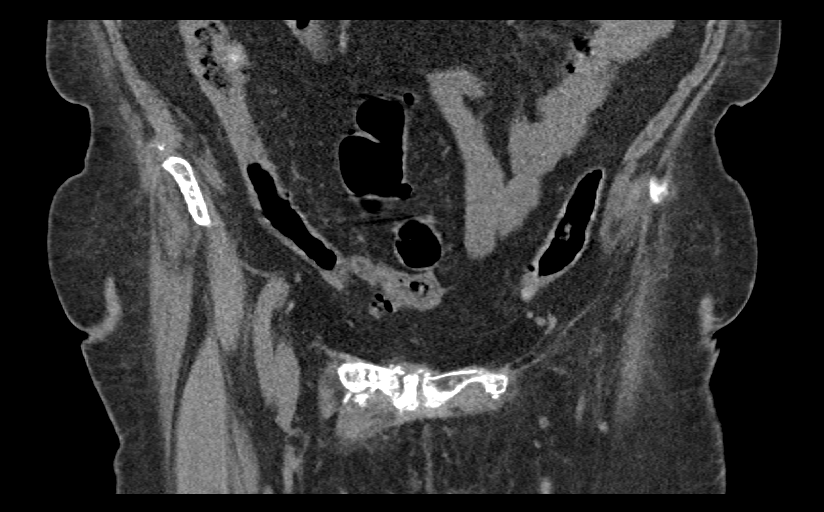
[im 83/187  soft-tissue]
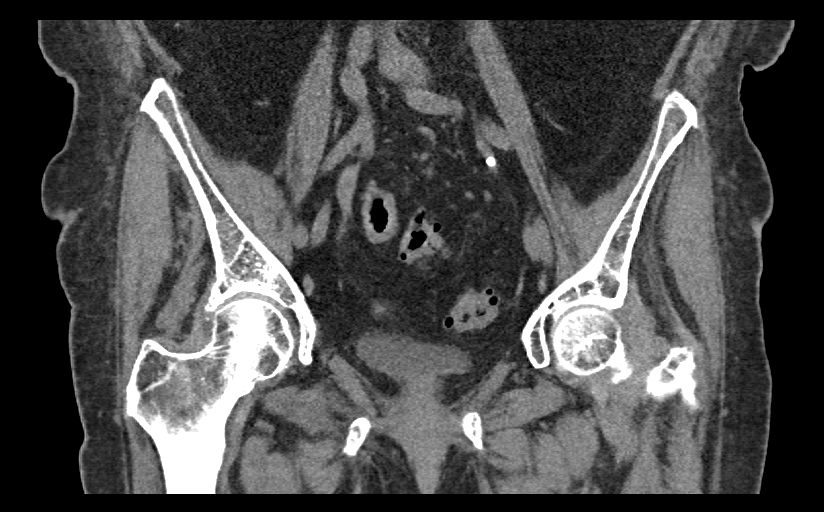
[im 104/187  soft-tissue]
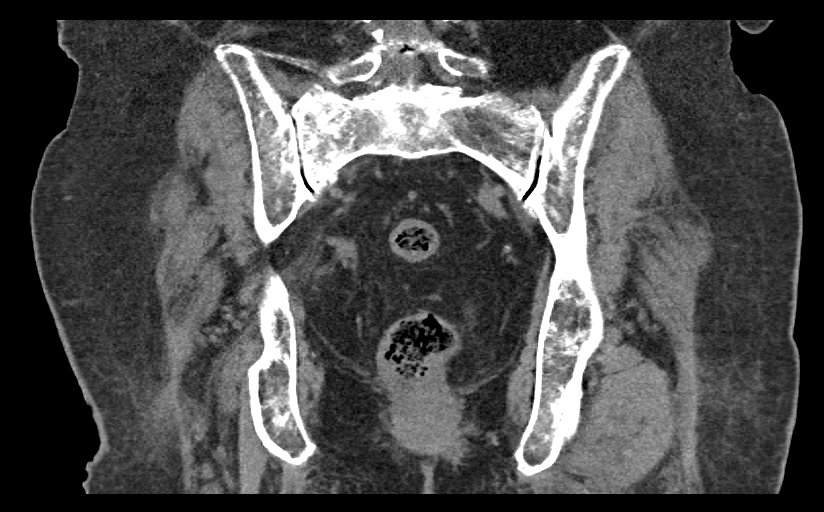

[16 of 46 positions shown; findings below may reference images not displayed]

FINDINGS: Urinary Tract:  Bladder decompressed.

Bowel: Diverticular changes noted diffusely in the visualized colon
without diverticulitis.

Vascular/Lymphatic: No iliac artery aneurysm. No pelvic sidewall
lymphadenopathy.

Reproductive:  Uterus surgically absent.  There is no adnexal mass.

Other:  No intraperitoneal free fluid.

Musculoskeletal: Bones are diffusely demineralized. Surgical changes
noted in the right posterior elements of L5. Nonacute fracture
identified in the right pubic bone extending into the anterior
aspect of the inferior pubic ramus is similar to prior MRI. No
evidence for bridging bony callus.
IMPRESSION: Right pubic bone fracture involving the anterior aspect of the
superior and inferior pubic rami. Fracture line remains visible with
no bridging bony callus evident on today's study.
